# Patient Record
Sex: Female | Born: 1945 | Race: White | Hispanic: No | Marital: Single | Smoking: Never smoker
Health system: Southern US, Community
[De-identification: ages and names within clinical notes are randomized; demographics above are authoritative.]

## PROBLEM LIST (undated history)

## (undated) DIAGNOSIS — E0590 Thyrotoxicosis, unspecified without thyrotoxic crisis or storm: Secondary | ICD-10-CM

## (undated) DIAGNOSIS — I10 Essential (primary) hypertension: Secondary | ICD-10-CM

## (undated) DIAGNOSIS — D6481 Anemia due to antineoplastic chemotherapy: Secondary | ICD-10-CM

## (undated) DIAGNOSIS — Z7901 Long term (current) use of anticoagulants: Secondary | ICD-10-CM

## (undated) DIAGNOSIS — I495 Sick sinus syndrome: Secondary | ICD-10-CM

## (undated) DIAGNOSIS — N319 Neuromuscular dysfunction of bladder, unspecified: Secondary | ICD-10-CM

## (undated) DIAGNOSIS — E119 Type 2 diabetes mellitus without complications: Secondary | ICD-10-CM

## (undated) DIAGNOSIS — K219 Gastro-esophageal reflux disease without esophagitis: Secondary | ICD-10-CM

## (undated) DIAGNOSIS — Z6841 Body Mass Index (BMI) 40.0 and over, adult: Secondary | ICD-10-CM

## (undated) DIAGNOSIS — J984 Other disorders of lung: Secondary | ICD-10-CM

## (undated) DIAGNOSIS — I517 Cardiomegaly: Secondary | ICD-10-CM

## (undated) DIAGNOSIS — Z91199 Patient's noncompliance with other medical treatment and regimen due to unspecified reason: Secondary | ICD-10-CM

## (undated) DIAGNOSIS — I700 Atherosclerosis of aorta: Secondary | ICD-10-CM

## (undated) DIAGNOSIS — R600 Localized edema: Secondary | ICD-10-CM

## (undated) DIAGNOSIS — I4891 Unspecified atrial fibrillation: Secondary | ICD-10-CM

## (undated) DIAGNOSIS — G4730 Sleep apnea, unspecified: Secondary | ICD-10-CM

## (undated) DIAGNOSIS — K7460 Unspecified cirrhosis of liver: Secondary | ICD-10-CM

## (undated) DIAGNOSIS — J189 Pneumonia, unspecified organism: Secondary | ICD-10-CM

## (undated) DIAGNOSIS — I272 Pulmonary hypertension, unspecified: Secondary | ICD-10-CM

## (undated) DIAGNOSIS — Z950 Presence of cardiac pacemaker: Secondary | ICD-10-CM

## (undated) DIAGNOSIS — T8859XA Other complications of anesthesia, initial encounter: Secondary | ICD-10-CM

## (undated) DIAGNOSIS — E559 Vitamin D deficiency, unspecified: Secondary | ICD-10-CM

## (undated) DIAGNOSIS — J1282 Pneumonia due to coronavirus disease 2019: Secondary | ICD-10-CM

## (undated) DIAGNOSIS — F411 Generalized anxiety disorder: Secondary | ICD-10-CM

## (undated) DIAGNOSIS — I77810 Thoracic aortic ectasia: Secondary | ICD-10-CM

## (undated) DIAGNOSIS — U071 COVID-19: Secondary | ICD-10-CM

## (undated) DIAGNOSIS — I5030 Unspecified diastolic (congestive) heart failure: Secondary | ICD-10-CM

## (undated) DIAGNOSIS — I509 Heart failure, unspecified: Secondary | ICD-10-CM

## (undated) DIAGNOSIS — I2510 Atherosclerotic heart disease of native coronary artery without angina pectoris: Secondary | ICD-10-CM

## (undated) HISTORY — PX: TOTAL ABDOMINAL HYSTERECTOMY: SHX209

## (undated) HISTORY — DX: Body Mass Index (BMI) 40.0 and over, adult: Z684

## (undated) HISTORY — PX: OTHER SURGICAL HISTORY: SHX169

## (undated) HISTORY — PX: CARDIAC CATHETERIZATION: SHX172

## (undated) HISTORY — PX: ABSCESS DRAINAGE: SHX1119

## (undated) HISTORY — PX: TUBAL LIGATION: SHX77

## (undated) HISTORY — PX: CHOLECYSTECTOMY: SHX55

## (undated) HISTORY — DX: Pneumonia due to coronavirus disease 2019: J12.82

---

## 2007-04-15 DIAGNOSIS — I2510 Atherosclerotic heart disease of native coronary artery without angina pectoris: Secondary | ICD-10-CM

## 2007-04-15 HISTORY — DX: Atherosclerotic heart disease of native coronary artery without angina pectoris: I25.10

## 2008-06-26 DIAGNOSIS — Z950 Presence of cardiac pacemaker: Secondary | ICD-10-CM

## 2008-06-26 DIAGNOSIS — I495 Sick sinus syndrome: Secondary | ICD-10-CM

## 2008-06-26 HISTORY — PX: CARDIAC PACEMAKER PLACEMENT: SHX583

## 2008-06-26 HISTORY — DX: Presence of cardiac pacemaker: Z95.0

## 2008-06-26 HISTORY — DX: Sick sinus syndrome: I49.5

## 2011-08-03 ENCOUNTER — Emergency Department: Payer: Self-pay | Admitting: Emergency Medicine

## 2011-08-03 LAB — COMPREHENSIVE METABOLIC PANEL
Albumin: 3.1 g/dL — ABNORMAL LOW (ref 3.4–5.0)
Anion Gap: 10 (ref 7–16)
BUN: 15 mg/dL (ref 7–18)
Calcium, Total: 8.5 mg/dL (ref 8.5–10.1)
Creatinine: 1.1 mg/dL (ref 0.60–1.30)
EGFR (African American): >60
Osmolality: 288 (ref 275–301)
Sodium: 144 mmol/L (ref 136–145)

## 2011-08-03 LAB — CBC
HCT: 41.4 % (ref 35.0–47.0)
HGB: 13.2 g/dL (ref 12.0–16.0)
MCV: 87 fL (ref 80–100)
Platelet: 185 10*3/uL (ref 150–440)
RBC: 4.74 10*6/uL (ref 3.80–5.20)
RDW: 14.4 % (ref 11.5–14.5)
WBC: 6.5 10*3/uL (ref 3.6–11.0)

## 2011-08-03 LAB — CK TOTAL AND CKMB (NOT AT ARMC)
CK, Total: 43 U/L (ref 21–215)
CK-MB: <0.5 ng/mL — ABNORMAL LOW (ref 0.5–3.6)

## 2011-08-03 LAB — PRO B NATRIURETIC PEPTIDE: B-Type Natriuretic Peptide: 237 pg/mL — ABNORMAL HIGH (ref 0–125)

## 2011-08-03 LAB — PROTIME-INR: Prothrombin Time: 18.9 secs — ABNORMAL HIGH (ref 11.5–14.7)

## 2011-12-26 ENCOUNTER — Emergency Department: Payer: Self-pay | Admitting: Emergency Medicine

## 2011-12-26 LAB — URINALYSIS, COMPLETE
Blood: NEGATIVE
Hyaline Cast: 1
Nitrite: NEGATIVE
Protein: 30
RBC,UR: <1 /HPF (ref 0–5)
WBC UR: 1 /HPF (ref 0–5)

## 2011-12-28 ENCOUNTER — Emergency Department: Payer: Self-pay | Admitting: Emergency Medicine

## 2013-08-19 HISTORY — PX: TEE WITH CARDIOVERSION: SHX5442

## 2013-08-19 HISTORY — PX: CARDIOVERSION: SHX1299

## 2013-09-08 HISTORY — PX: CARDIOVERSION: SHX1299

## 2016-03-28 HISTORY — PX: PACEMAKER GENERATOR CHANGE: SHX5998

## 2019-08-08 ENCOUNTER — Encounter: Payer: Self-pay | Admitting: Emergency Medicine

## 2019-08-08 ENCOUNTER — Inpatient Hospital Stay
Admission: EM | Admit: 2019-08-08 | Discharge: 2019-08-12 | DRG: 177 | Disposition: A | Discharge status: Home / Self Care | Payer: Medicare Other | Attending: Internal Medicine | Admitting: Internal Medicine

## 2019-08-08 ENCOUNTER — Emergency Department: Payer: Medicare Other

## 2019-08-08 ENCOUNTER — Other Ambulatory Visit: Payer: Self-pay

## 2019-08-08 DIAGNOSIS — Z8616 Personal history of COVID-19: Secondary | ICD-10-CM

## 2019-08-08 DIAGNOSIS — Z6841 Body Mass Index (BMI) 40.0 and over, adult: Secondary | ICD-10-CM | POA: Diagnosis not present

## 2019-08-08 DIAGNOSIS — I480 Paroxysmal atrial fibrillation: Secondary | ICD-10-CM | POA: Diagnosis not present

## 2019-08-08 DIAGNOSIS — U071 COVID-19: Secondary | ICD-10-CM | POA: Diagnosis present

## 2019-08-08 DIAGNOSIS — I5031 Acute diastolic (congestive) heart failure: Secondary | ICD-10-CM | POA: Diagnosis not present

## 2019-08-08 DIAGNOSIS — Y92009 Unspecified place in unspecified non-institutional (private) residence as the place of occurrence of the external cause: Secondary | ICD-10-CM | POA: Diagnosis not present

## 2019-08-08 DIAGNOSIS — Z7984 Long term (current) use of oral hypoglycemic drugs: Secondary | ICD-10-CM

## 2019-08-08 DIAGNOSIS — I110 Hypertensive heart disease with heart failure: Secondary | ICD-10-CM | POA: Diagnosis present

## 2019-08-08 DIAGNOSIS — I5033 Acute on chronic diastolic (congestive) heart failure: Secondary | ICD-10-CM | POA: Diagnosis present

## 2019-08-08 DIAGNOSIS — L03115 Cellulitis of right lower limb: Secondary | ICD-10-CM | POA: Diagnosis present

## 2019-08-08 DIAGNOSIS — I4891 Unspecified atrial fibrillation: Secondary | ICD-10-CM | POA: Diagnosis present

## 2019-08-08 DIAGNOSIS — L0390 Cellulitis, unspecified: Secondary | ICD-10-CM

## 2019-08-08 DIAGNOSIS — J9601 Acute respiratory failure with hypoxia: Secondary | ICD-10-CM | POA: Diagnosis present

## 2019-08-08 DIAGNOSIS — T501X6A Underdosing of loop [high-ceiling] diuretics, initial encounter: Secondary | ICD-10-CM | POA: Diagnosis present

## 2019-08-08 DIAGNOSIS — T380X5A Adverse effect of glucocorticoids and synthetic analogues, initial encounter: Secondary | ICD-10-CM | POA: Diagnosis present

## 2019-08-08 DIAGNOSIS — E119 Type 2 diabetes mellitus without complications: Secondary | ICD-10-CM

## 2019-08-08 DIAGNOSIS — Z79899 Other long term (current) drug therapy: Secondary | ICD-10-CM | POA: Diagnosis not present

## 2019-08-08 DIAGNOSIS — E1165 Type 2 diabetes mellitus with hyperglycemia: Secondary | ICD-10-CM | POA: Diagnosis present

## 2019-08-08 DIAGNOSIS — L8990 Pressure ulcer of unspecified site, unspecified stage: Secondary | ICD-10-CM | POA: Insufficient documentation

## 2019-08-08 DIAGNOSIS — Z91128 Patient's intentional underdosing of medication regimen for other reason: Secondary | ICD-10-CM | POA: Diagnosis not present

## 2019-08-08 DIAGNOSIS — Z7901 Long term (current) use of anticoagulants: Secondary | ICD-10-CM | POA: Diagnosis not present

## 2019-08-08 DIAGNOSIS — R0902 Hypoxemia: Secondary | ICD-10-CM

## 2019-08-08 DIAGNOSIS — J1282 Pneumonia due to coronavirus disease 2019: Secondary | ICD-10-CM | POA: Diagnosis not present

## 2019-08-08 HISTORY — DX: Essential (primary) hypertension: I10

## 2019-08-08 HISTORY — DX: Personal history of COVID-19: Z86.16

## 2019-08-08 HISTORY — DX: Heart failure, unspecified: I50.9

## 2019-08-08 HISTORY — DX: Type 2 diabetes mellitus without complications: E11.9

## 2019-08-08 HISTORY — DX: Unspecified atrial fibrillation: I48.91

## 2019-08-08 LAB — LACTIC ACID, PLASMA: Lactic Acid, Venous: 1.8 mmol/L (ref 0.5–1.9)

## 2019-08-08 LAB — COMPREHENSIVE METABOLIC PANEL
ALT: 17 U/L (ref 0–44)
AST: 36 U/L (ref 15–41)
Albumin: 2.3 g/dL — ABNORMAL LOW (ref 3.5–5.0)
Alkaline Phosphatase: 75 U/L (ref 38–126)
Anion gap: 11 (ref 5–15)
BUN: 14 mg/dL (ref 8–23)
CO2: 25 mmol/L (ref 22–32)
Calcium: 8.2 mg/dL — ABNORMAL LOW (ref 8.9–10.3)
Chloride: 100 mmol/L (ref 98–111)
Creatinine, Ser: 0.89 mg/dL (ref 0.44–1.00)
GFR calc Af Amer: >60 mL/min (ref >60)
GFR calc non Af Amer: >60 mL/min (ref >60)
Glucose, Bld: 192 mg/dL — ABNORMAL HIGH (ref 70–99)
Potassium: 4.8 mmol/L (ref 3.5–5.1)
Sodium: 136 mmol/L (ref 135–145)
Total Bilirubin: 1.8 mg/dL — ABNORMAL HIGH (ref 0.3–1.2)
Total Protein: 7.3 g/dL (ref 6.5–8.1)

## 2019-08-08 LAB — CBC WITH DIFFERENTIAL/PLATELET
Abs Immature Granulocytes: 0.4 10*3/uL — ABNORMAL HIGH (ref 0.00–0.07)
Basophils Absolute: 0.1 10*3/uL (ref 0.0–0.1)
Basophils Relative: 0 %
Eosinophils Absolute: 0 10*3/uL (ref 0.0–0.5)
Eosinophils Relative: 0 %
HCT: 42 % (ref 36.0–46.0)
Hemoglobin: 12.9 g/dL (ref 12.0–15.0)
Immature Granulocytes: 3 %
Lymphocytes Relative: 7 %
Lymphs Abs: 1 10*3/uL (ref 0.7–4.0)
MCH: 26.7 pg (ref 26.0–34.0)
MCHC: 30.7 g/dL (ref 30.0–36.0)
MCV: 86.8 fL (ref 80.0–100.0)
Monocytes Absolute: 1.2 10*3/uL — ABNORMAL HIGH (ref 0.1–1.0)
Monocytes Relative: 9 %
Neutro Abs: 11.5 10*3/uL — ABNORMAL HIGH (ref 1.7–7.7)
Neutrophils Relative %: 81 %
Platelets: 297 10*3/uL (ref 150–400)
RBC: 4.84 MIL/uL (ref 3.87–5.11)
RDW: 14.5 % (ref 11.5–15.5)
WBC: 14.2 10*3/uL — ABNORMAL HIGH (ref 4.0–10.5)
nRBC: 0 % (ref 0.0–0.2)

## 2019-08-08 LAB — BRAIN NATRIURETIC PEPTIDE: B Natriuretic Peptide: 168 pg/mL — ABNORMAL HIGH (ref 0.0–100.0)

## 2019-08-08 LAB — GLUCOSE, CAPILLARY: Glucose-Capillary: 178 mg/dL — ABNORMAL HIGH (ref 70–99)

## 2019-08-08 LAB — RESPIRATORY PANEL BY RT PCR (FLU A&B, COVID)
Influenza A by PCR: NEGATIVE
Influenza B by PCR: NEGATIVE
SARS Coronavirus 2 by RT PCR: POSITIVE — AB

## 2019-08-08 LAB — TROPONIN I (HIGH SENSITIVITY): Troponin I (High Sensitivity): 7 ng/L (ref <18)

## 2019-08-08 LAB — FIBRIN DERIVATIVES D-DIMER (ARMC ONLY): Fibrin derivatives D-dimer (ARMC): 3749.31 ng/mL (FEU) — ABNORMAL HIGH (ref 0.00–499.00)

## 2019-08-08 LAB — PROCALCITONIN: Procalcitonin: 0.55 ng/mL

## 2019-08-08 MED ORDER — SODIUM CHLORIDE 0.9 % IV SOLN
100.0000 mg | Freq: Every day | INTRAVENOUS | Status: AC
  Administered 2019-08-09 – 2019-08-12 (×4): 100 mg via INTRAVENOUS
  Filled 2019-08-08: qty 20
  Filled 2019-08-08: qty 100
  Filled 2019-08-08 (×3): qty 20

## 2019-08-08 MED ORDER — VANCOMYCIN HCL 10 G IV SOLR
2500.0000 mg | Freq: Once | INTRAVENOUS | Status: AC
  Administered 2019-08-08: 20:00:00 2500 mg via INTRAVENOUS
  Filled 2019-08-08: qty 2500

## 2019-08-08 MED ORDER — INSULIN ASPART 100 UNIT/ML ~~LOC~~ SOLN
0.0000 [IU] | Freq: Three times a day (TID) | SUBCUTANEOUS | Status: DC
  Administered 2019-08-09 – 2019-08-10 (×4): 5 [IU] via SUBCUTANEOUS
  Administered 2019-08-10: 12:00:00 3 [IU] via SUBCUTANEOUS
  Administered 2019-08-10 – 2019-08-11 (×2): 8 [IU] via SUBCUTANEOUS
  Administered 2019-08-11: 17:00:00 2 [IU] via SUBCUTANEOUS
  Administered 2019-08-11: 12:00:00 5 [IU] via SUBCUTANEOUS
  Administered 2019-08-12: 13:00:00 3 [IU] via SUBCUTANEOUS
  Administered 2019-08-12: 8 [IU] via SUBCUTANEOUS
  Filled 2019-08-08 (×8): qty 1

## 2019-08-08 MED ORDER — ONDANSETRON HCL 4 MG/2ML IJ SOLN: 4.0000 mg | Freq: Four times a day (QID) | INTRAMUSCULAR | Status: DC | PRN

## 2019-08-08 MED ORDER — ACETAMINOPHEN 325 MG PO TABS
650.0000 mg | ORAL_TABLET | Freq: Four times a day (QID) | ORAL | Status: DC | PRN
  Administered 2019-08-11: 21:00:00 650 mg via ORAL
  Filled 2019-08-08: qty 2

## 2019-08-08 MED ORDER — ASCORBIC ACID 500 MG PO TABS
500.0000 mg | ORAL_TABLET | Freq: Every day | ORAL | Status: DC
  Administered 2019-08-08 – 2019-08-12 (×5): 500 mg via ORAL
  Filled 2019-08-08 (×5): qty 1

## 2019-08-08 MED ORDER — VITAMIN D 25 MCG (1000 UNIT) PO TABS
1000.0000 [IU] | ORAL_TABLET | Freq: Every day | ORAL | Status: DC
  Administered 2019-08-08 – 2019-08-12 (×5): 1000 [IU] via ORAL
  Filled 2019-08-08 (×6): qty 1

## 2019-08-08 MED ORDER — METOPROLOL SUCCINATE ER 25 MG PO TB24: 12.5000 mg | ORAL_TABLET | Freq: Every day | ORAL | Status: DC

## 2019-08-08 MED ORDER — FUROSEMIDE 10 MG/ML IJ SOLN: 40.0000 mg | Freq: Every day | INTRAMUSCULAR | Status: DC

## 2019-08-08 MED ORDER — POTASSIUM CHLORIDE CRYS ER 10 MEQ PO TBCR
10.0000 meq | EXTENDED_RELEASE_TABLET | Freq: Three times a day (TID) | ORAL | Status: DC
  Administered 2019-08-08 – 2019-08-12 (×11): 10 meq via ORAL
  Filled 2019-08-08 (×10): qty 1

## 2019-08-08 MED ORDER — ZINC SULFATE 220 (50 ZN) MG PO CAPS
220.0000 mg | ORAL_CAPSULE | Freq: Every day | ORAL | Status: DC
  Administered 2019-08-08 – 2019-08-12 (×5): 220 mg via ORAL
  Filled 2019-08-08 (×5): qty 1

## 2019-08-08 MED ORDER — IPRATROPIUM-ALBUTEROL 20-100 MCG/ACT IN AERS
1.0000 | INHALATION_SPRAY | Freq: Four times a day (QID) | RESPIRATORY_TRACT | Status: DC
  Administered 2019-08-08 – 2019-08-12 (×15): 1 via RESPIRATORY_TRACT
  Filled 2019-08-08: qty 4

## 2019-08-08 MED ORDER — ACETAMINOPHEN 650 MG RE SUPP: 650.0000 mg | Freq: Four times a day (QID) | RECTAL | Status: DC | PRN

## 2019-08-08 MED ORDER — SODIUM CHLORIDE 0.9 % IV SOLN
2.0000 g | Freq: Once | INTRAVENOUS | Status: AC
  Administered 2019-08-08: 2 g via INTRAVENOUS
  Filled 2019-08-08: qty 20

## 2019-08-08 MED ORDER — SODIUM CHLORIDE 0.9 % IV BOLUS: 500.0000 mL | Freq: Once | INTRAVENOUS | Status: DC

## 2019-08-08 MED ORDER — METOPROLOL TARTRATE 5 MG/5ML IV SOLN
2.5000 mg | Freq: Once | INTRAVENOUS | Status: AC
  Administered 2019-08-08: 2.5 mg via INTRAVENOUS
  Filled 2019-08-08: qty 5

## 2019-08-08 MED ORDER — ONDANSETRON HCL 4 MG PO TABS: 4.0000 mg | ORAL_TABLET | Freq: Four times a day (QID) | ORAL | Status: DC | PRN

## 2019-08-08 MED ORDER — METOPROLOL TARTRATE 25 MG PO TABS
12.5000 mg | ORAL_TABLET | Freq: Two times a day (BID) | ORAL | Status: DC
  Administered 2019-08-08 – 2019-08-12 (×8): 12.5 mg via ORAL
  Filled 2019-08-08 (×8): qty 1

## 2019-08-08 MED ORDER — CEFAZOLIN SODIUM-DEXTROSE 2-4 GM/100ML-% IV SOLN
2.0000 g | Freq: Three times a day (TID) | INTRAVENOUS | Status: DC
  Administered 2019-08-08 – 2019-08-12 (×12): 2 g via INTRAVENOUS
  Filled 2019-08-08 (×13): qty 100

## 2019-08-08 MED ORDER — RIVAROXABAN 20 MG PO TABS
20.0000 mg | ORAL_TABLET | Freq: Every evening | ORAL | Status: DC
  Administered 2019-08-09 – 2019-08-11 (×4): 20 mg via ORAL
  Filled 2019-08-08 (×5): qty 1

## 2019-08-08 MED ORDER — FUROSEMIDE 10 MG/ML IJ SOLN
40.0000 mg | Freq: Once | INTRAMUSCULAR | Status: DC
  Filled 2019-08-08: qty 4

## 2019-08-08 MED ORDER — DEXAMETHASONE SODIUM PHOSPHATE 10 MG/ML IJ SOLN
6.0000 mg | INTRAMUSCULAR | Status: DC
  Administered 2019-08-08 – 2019-08-11 (×4): 6 mg via INTRAVENOUS
  Filled 2019-08-08 (×5): qty 1

## 2019-08-08 MED ORDER — INSULIN ASPART 100 UNIT/ML ~~LOC~~ SOLN
0.0000 [IU] | Freq: Every day | SUBCUTANEOUS | Status: DC
  Filled 2019-08-08 (×3): qty 1

## 2019-08-08 MED ORDER — SODIUM CHLORIDE 0.9 % IV SOLN
200.0000 mg | Freq: Once | INTRAVENOUS | Status: AC
  Administered 2019-08-09: 03:00:00 200 mg via INTRAVENOUS
  Filled 2019-08-08: qty 40

## 2019-08-08 NOTE — ED Triage Notes (Signed)
Pt to ED via [facility] from home for shortness of breath. Pt states that she was diagnosed with COVID 2 weeks ago and is still not feeling well. Pt is tachycardic on arrival. Per EMS pt SpO2 90% on room mail. Pt is currently on 2 liters. Pt is in NAD

## 2019-08-08 NOTE — Consult Note (Signed)
Pharmacy Antibiotic Note  Jennifer Gregory is a 74 y.o. female admitted on 08/08/2019 with cellulitis.  Pharmacy has been consulted for Cefazolin dosing. Patient received 1 dose of Rocephin 2g VI in ED.  Plan: Cefazolin 2g Q8 hours  Height: 5\' 4"  (162.6 cm) Weight: (!) 308 lb (139.7 kg) IBW/kg (Calculated) : 54.7  Temp (24hrs), Avg:98.4 F (36.9 C), Min:98.4 F (36.9 C), Max:98.4 F (36.9 C)  Recent Labs  Lab 08/08/19 1413 08/08/19 1538  WBC 14.2*  --   CREATININE  --  0.89  LATICACIDVEN  --  1.8    Estimated Creatinine Clearance: 77.7 mL/min (by C-G formula based on SCr of 0.89 mg/dL).    Allergies  Allergen Reactions  . Aspirin Other (See Comments)    Bleeding risk since patient is on xarelta  . Ciprofloxacin Other (See Comments)    Other Reaction: RASH ITCHING   . Codeine Other (See Comments)  . Diazepam Other (See Comments) and Nausea And Vomiting  . Dofetilide Other (See Comments)    Other Reaction: arrhythmia   . Hydrocodone-Acetaminophen Other (See Comments) and Nausea And Vomiting  . Morphine Nausea And Vomiting  . Niacin Other (See Comments)    fatigue  . Oxycodone-Acetaminophen Other (See Comments) and Nausea And Vomiting  . Sotalol Other (See Comments)    Other Reaction: Prolonged QTc   . Amiodarone Other (See Comments)    Affecting liver   . Pyridoxine Other (See Comments)    Splitting headache  . Sulfamethoxazole-Trimethoprim Swelling    Antimicrobials this admission: Cefazolin 3/19 >>  Rocephin 3/19 x1  Microbiology results: 3/19 BCx: pending  Thank you for allowing pharmacy to be a part of this patient's care.  4/19 08/08/2019 6:36 PM

## 2019-08-08 NOTE — ED Notes (Signed)
Report given to Jenna, RN

## 2019-08-08 NOTE — ED Provider Notes (Signed)
[facility] Emergency Department Provider Note  ____________________________________________   First MD Initiated Contact with Patient 08/08/19 1325     (approximate)  I have reviewed the triage vital signs and the nursing notes.   HISTORY  Chief Complaint Shortness of Breath    HPI Jennifer Gregory is a 74 y.o. female with history of hypertension, diabetes, CHF, here with generalized weakness.  The patient recently was diagnosed with Covid over the last month.  She has had progressively worsening general fatigue and intermittent shortness of breath since then.  She had been fairly stable, however, until over the last several days she has had acute worsening of her weakness, and now is having difficulty just getting around.  She states that she did bump her leg last week, and has noticed some increasing redness and pain in this area.  She has had subjective chills, decreased appetite, and extreme fatigue.  She barely could even walk work or have a shower today without assistance.  This is very new for her.  She does also admit she has not been taking her diabetic medications or her Lasix as often, though she reports this is because she is not eating and drinking due to poor appetite.  She had mild nausea but no vomiting.        Past Medical History:  Diagnosis Date  . CHF (congestive heart failure) ([place])   . Diabetes mellitus without complication ([place])   . Hypertension     There are no problems to display for this patient.   Past Surgical History:  Procedure Laterality Date  . pacemaker      Prior to Admission medications   Medication Sig Start Date End Date Taking? Authorizing Provider  furosemide (LASIX) 40 MG tablet Take 80 mg by mouth daily. (take at about 4PM)   Yes [provider]  glipiZIDE (GLUCOTROL XL) 5 MG 24 hr tablet Take 5 mg by mouth daily. 06/12/19  Yes [provider]  metoprolol succinate (TOPROL-XL) 25 MG 24 hr  tablet Take 12.5 mg by mouth daily.   Yes [provider]  potassium chloride (KLOR-CON) 10 MEQ tablet Take 20 mEq by mouth 3 (three) times daily. 07/14/19  Yes [provider]  rivaroxaban (XARELTO) 20 MG TABS tablet Take 20 mg by mouth every evening. 05/30/19  Yes [provider]    Allergies Aspirin, Ciprofloxacin, Codeine, Diazepam, Dofetilide, Hydrocodone-acetaminophen, Morphine, Niacin, Oxycodone-acetaminophen, Sotalol, Amiodarone, Pyridoxine, and Sulfamethoxazole-trimethoprim  No family history on file.  Social History Social History   Tobacco Use  . Smoking status: Never Smoker  . Smokeless tobacco: Never Used  Substance Use Topics  . Alcohol use: Not Currently  . Drug use: Not Currently    Review of Systems  Review of Systems  Constitutional: Positive for fatigue.  Respiratory: Positive for shortness of breath.   Skin: Positive for rash.     ____________________________________________  PHYSICAL EXAM:      VITAL SIGNS: ED Triage Vitals  Enc Vitals Group     BP 08/08/19 1316 137/76     Pulse Rate 08/08/19 1316 89     Resp 08/08/19 1316 (!) 30     Temp 08/08/19 1323 98.4 F (36.9 C)     Temp Source 08/08/19 1323 Oral     SpO2 08/08/19 1316 92 %     Weight 08/08/19 1317 (!) 308 lb (139.7 kg)     Height 08/08/19 1317 5\' 4"  (1.626 m)     Head Circumference --  Peak Flow --      Pain Score 08/08/19 1317 0     Pain Loc --      Pain Edu? --      Excl. in GC? --      Physical Exam   Media Information   Document Information  Photos    08/08/2019 14:03  Attached To:  Hospital Encounter on 08/08/19  Source Information  Shaune Pollack, MD  [facility]    ____________________________________________   LABS (all labs ordered are listed, but only abnormal results are displayed)  Labs Reviewed  CBC WITH DIFFERENTIAL/PLATELET - Abnormal; Notable for the following components:      Result Value   WBC 14.2 (*)     Neutro Abs 11.5 (*)    Monocytes Absolute 1.2 (*)    Abs Immature Granulocytes 0.40 (*)    All other components within normal limits  BRAIN NATRIURETIC PEPTIDE - Abnormal; Notable for the following components:   B Natriuretic Peptide 168.0 (*)    All other components within normal limits  FIBRIN DERIVATIVES D-DIMER ([facility] ONLY) - Abnormal; Notable for the following components:   Fibrin derivatives D-dimer ([facility]) 3,749.31 (*)    All other components within normal limits  COMPREHENSIVE METABOLIC PANEL - Abnormal; Notable for the following components:   Glucose, Bld 192 (*)    Calcium 8.2 (*)    Albumin 2.3 (*)    Total Bilirubin 1.8 (*)    All other components within normal limits  CULTURE, BLOOD (ROUTINE X 2)  CULTURE, BLOOD (ROUTINE X 2)  RESPIRATORY PANEL BY RT PCR (FLU A&B, COVID)  LACTIC ACID, PLASMA  PROCALCITONIN  TROPONIN I (HIGH SENSITIVITY)    ____________________________________________  EKG: AFib, VR 126. QRS 149, QTc 622. Atrial flutter with rapid rate. Non-specific ST changes. ________________________________________  RADIOLOGY All imaging, including plain films, CT scans, and ultrasounds, independently reviewed by me, and interpretations confirmed via formal radiology reads.  ED MD interpretation:   CXR: Cardiomegaly, ? Trace edema XR Knee: Pending DVT: Pending  Official radiology report(s): DG Chest 2 View  Result Date: 08/08/2019 CLINICAL DATA:  Shortness of breath. EXAM: CHEST - 2 VIEW COMPARISON:  Chest x-ray 08/03/2011. FINDINGS: Cardiac pacer with lead tips over the right atrium right ventricle. Cardiomegaly with pulmonary venous congestion. Bibasilar atelectasis. Small bilateral pleural effusions. No pneumothorax. Degenerative change thoracic spine. IMPRESSION: 1. Cardiac pacer noted with lead tips over the right atrium right ventricle. Cardiomegaly with pulmonary venous congestion. 2. Mild bibasilar atelectasis. Small bilateral pleural effusions.  Electronically Signed   By: Maisie Fus  Register   On: 08/08/2019 13:53   US Venous Img Lower Unilateral Right  Result Date: 08/08/2019 CLINICAL DATA:  Right lower extremity pain and edema for the past 2 weeks. Evaluate for DVT. EXAM: RIGHT LOWER EXTREMITY VENOUS DOPPLER ULTRASOUND TECHNIQUE: Gray-scale sonography with graded compression, as well as color Doppler and duplex ultrasound were performed to evaluate the lower extremity deep venous systems from the level of the common femoral vein and including the common femoral, femoral, profunda femoral, popliteal and calf veins including the posterior tibial, peroneal and gastrocnemius veins when visible. The superficial great saphenous vein was also interrogated. Spectral Doppler was utilized to evaluate flow at rest and with distal augmentation maneuvers in the common femoral, femoral and popliteal veins. COMPARISON:  None. FINDINGS: Examination degraded due to patient body habitus and poor sonographic window. Contralateral Common Femoral Vein: Respiratory phasicity is normal and symmetric with the symptomatic side. No evidence of thrombus. Normal compressibility.  Common Femoral Vein: No evidence of thrombus. Normal compressibility, respiratory phasicity and response to augmentation. Saphenofemoral Junction: No evidence of thrombus. Normal compressibility and flow on color Doppler imaging. Profunda Femoral Vein: No evidence of thrombus. Normal compressibility and flow on color Doppler imaging. Femoral Vein: No evidence of thrombus. Normal compressibility, respiratory phasicity and response to augmentation. Popliteal Vein: No evidence of thrombus. Normal compressibility, respiratory phasicity and response to augmentation. Calf Veins: Appear patent where visualized. Superficial Great Saphenous Vein: No evidence of thrombus. Normal compressibility. Venous Reflux:  None. Other Findings:  None. IMPRESSION: No evidence of DVT within the right lower extremity.  Electronically Signed   By: Simonne Come M.D.   On: 08/08/2019 16:46   DG Knee Right Port  Result Date: 08/08/2019 CLINICAL DATA:  Right knee pain for 4 weeks EXAM: PORTABLE RIGHT KNEE - 1-2 VIEW COMPARISON:  None. FINDINGS: No acute fracture or malalignment. Mild-moderate medial compartment osteoarthritis. No appreciable knee joint effusion. Bones appear mildly demineralized. No focal soft tissue abnormality. IMPRESSION: No acute osseous abnormality of the right knee. Electronically Signed   By: Duanne Guess D.O.   On: 08/08/2019 16:07    ____________________________________________  PROCEDURES   Procedure(s) performed (including Critical Care):  .Critical Care Performed by: Shaune Pollack, MD Authorized by: Shaune Pollack, MD   Critical care provider statement:    Critical care time (minutes):  35   Critical care time was exclusive of:  Separately billable procedures and treating other patients and teaching time   Critical care was necessary to treat or prevent imminent or life-threatening deterioration of the following conditions:  Cardiac failure, circulatory failure and sepsis   Critical care was time spent personally by me on the following activities:  Development of treatment plan with patient or surrogate, discussions with consultants, evaluation of patient's response to treatment, examination of patient, obtaining history from patient or surrogate, ordering and performing treatments and interventions, ordering and review of laboratory studies, ordering and review of radiographic studies, pulse oximetry, re-evaluation of patient's condition and review of old charts   I assumed direction of critical care for this patient from another provider in my specialty: no   .1-3 Lead EKG Interpretation Performed by: Shaune Pollack, MD Authorized by: Shaune Pollack, MD     Interpretation: abnormal     ECG rate:  110-130s   ECG rate assessment: tachycardic     Rhythm: atrial  fibrillation     Ectopy: PVCs     Conduction: normal   Comments:     Indication: Atrial fibrillation with RVR, sepsis    ____________________________________________  INITIAL IMPRESSION / MDM / ASSESSMENT AND PLAN / ED COURSE  As part of my medical decision making, I reviewed the following data within the electronic MEDICAL RECORD NUMBER Nursing notes reviewed and incorporated, Old chart reviewed, Notes from prior ED visits, and [place] Controlled Substance Database       *Johann Gascoigne was evaluated in Emergency Department on 08/08/2019 for the symptoms described in the history of present illness. She was evaluated in the context of the global COVID-19 pandemic, which necessitated consideration that the patient might be at risk for infection with the SARS-CoV-2 virus that causes COVID-19. Institutional protocols and algorithms that pertain to the evaluation of patients at risk for COVID-19 are in a state of rapid change based on information released by regulatory bodies including the CDC and federal and state organizations. These policies and algorithms were followed during the patient's care in the ED.  Some ED  evaluations and interventions may be delayed as a result of limited staffing during the pandemic.*  Clinical Course as of Aug 07 1744  Fri Aug 08, 2019  747 74 year old female here with acute on chronic generalized weakness, tachycardia, and difficulty ambulating in the setting of recent Covid diagnosis.  On exam, she has fairly significant cellulitis of the right lower extremity, which she now admits she hit approximately week ago.  I suspect she has acute on chronic weakness secondary to active cellulitis of the right lower extremity, possibly causing worsening of her atrial fibrillation with subsequent mild CHF and respiratory distress.  She has significant leukocytosis with left shift, with A. fib RVR on the monitor.  Will give a low-dose of Lopressor, start empiric antibiotics, and plan to  admit.   [CI]    Clinical Course User Index [CI] Shaune Pollack, MD    Medical Decision Making:  As above. 74 yo F with ongoing deconditioning/SOB in setting of COVID-19 diagnosis 1 month ago, now with acute onset weakness and inability to ambulate likely 2/2 cellulitis of RLE, which occurred after scratching her leg on shower. No purulence but significant area of redness noted, so Vanc/Rocephin start. Pt in AFib RVR but is HDS - will trial lopressor IV, plan to admit. Holding fluids pending lactate, CMP as pt has h/o CHF and does appear hypervolemic.  ____________________________________________  FINAL CLINICAL IMPRESSION(S) / ED DIAGNOSES  Final diagnoses:  Cellulitis, unspecified cellulitis site  Atrial fibrillation with RVR ([facility])     MEDICATIONS GIVEN DURING THIS VISIT:  Medications  cefTRIAXone (ROCEPHIN) 2 g in sodium chloride 0.9 % 100 mL IVPB (2 g Intravenous New Bag/Given 08/08/19 1744)  vancomycin (VANCOCIN) 2,500 mg in sodium chloride 0.9 % 500 mL IVPB (has no administration in time range)  metoprolol tartrate (LOPRESSOR) injection 2.5 mg (2.5 mg Intravenous Given 08/08/19 1744)     ED Discharge Orders    None       Note:  This document was prepared using Dragon voice recognition software and may include unintentional dictation errors.   Shaune Pollack, MD 08/08/19 [phone]

## 2019-08-08 NOTE — Consult Note (Signed)
Remdesivir - Pharmacy Brief Note   O:  ALT: 36 [id number]:[id number] bibasilar atelectasis. Small bilateral pleural effusions.  SpO2: 95% on 2L   A/P:  Remdesivir 200 mg IVPB once followed by 100 mg IVPB daily x 4 days.   Bettey Costa, PharmD Clinical Pharmacist 08/08/2019 7:23 PM

## 2019-08-08 NOTE — Progress Notes (Signed)
PHARMACY -  BRIEF ANTIBIOTIC NOTE   Pharmacy has received consult(s) for Vancomycin from an ED provider.  The patient's profile has been reviewed for ht/wt/allergies/indication/available labs.    One time order(s) placed for Vancomycin 2500mg  IV x 1.  Further antibiotics/pharmacy consults should be ordered by admitting physician if indicated.                       Thank you, 08/08/2019  3:02 PM

## 2019-08-08 NOTE — ED Notes (Signed)
Per Dr. Renae Gloss 2nd lactic acid draw not needed

## 2019-08-08 NOTE — H&P (Signed)
[facility]-  at [facility]   PATIENT NAME: Jennifer Gregory    MR#:  [id number]  DATE OF BIRTH:  [birth year]  DATE OF ADMISSION:  08/08/2019  PRIMARY CARE PHYSICIAN: Rayetta Humphrey, MD   REQUESTING/REFERRING PHYSICIAN: Dr Dionne Bucy  CHIEF COMPLAINT:   Chief Complaint  Patient presents with  . Shortness of Breath    HISTORY OF PRESENT ILLNESS:  Jennifer Gregory  is a 74 y.o. female with a known history of recent diagnosis of a Covid infection in [place] on 07/22/2019.  She has not been feeling well at home.  She has been feeling very fatigued very weak.  She has been having some cough and some shortness of breath.  She has been having poor appetite and some nausea.  Only had a couple episodes of diarrhea.  No abdominal pain she also bumped her leg and thought it was a bruise but she has noticed some redness on her right leg going on for about a week.  She has been taking her Xarelto regularly but had not been taking her Lasix or glipizide.  In the ER she was found to have an elevated D-dimer.  She had a sonogram of the lower extremity that was negative for DVT.  Hospitalist services were contacted for further evaluation.  PAST MEDICAL HISTORY:   Past Medical History:  Diagnosis Date  . Atrial fibrillation (HCC)   . CHF (congestive heart failure) (HCC)   . Diabetes mellitus without complication (HCC)   . Hypertension     PAST SURGICAL HISTORY:   Past Surgical History:  Procedure Laterality Date  . ABSCESS DRAINAGE    . CESAREAN SECTION    . CHOLECYSTECTOMY    . pacemaker      SOCIAL HISTORY:   Social History   Tobacco Use  . Smoking status: Never Smoker  . Smokeless tobacco: Never Used  Substance Use Topics  . Alcohol use: Not Currently    FAMILY HISTORY:   Family History  Problem Relation Age of Onset  . Mesothelioma Father     DRUG ALLERGIES:   Allergies  Allergen Reactions  . Aspirin Other (See Comments)    Bleeding  risk since patient is on xarelta  . Ciprofloxacin Other (See Comments)    Other Reaction: RASH ITCHING   . Codeine Other (See Comments)  . Diazepam Other (See Comments) and Nausea And Vomiting  . Dofetilide Other (See Comments)    Other Reaction: arrhythmia   . Hydrocodone-Acetaminophen Other (See Comments) and Nausea And Vomiting  . Morphine Nausea And Vomiting  . Niacin Other (See Comments)    fatigue  . Oxycodone-Acetaminophen Other (See Comments) and Nausea And Vomiting  . Sotalol Other (See Comments)    Other Reaction: Prolonged QTc   . Amiodarone Other (See Comments)    Affecting liver   . Pyridoxine Other (See Comments)    Splitting headache  . Sulfamethoxazole-Trimethoprim Swelling    REVIEW OF SYSTEMS:  CONSTITUTIONAL: No fever, chills or sweats.  Positive for fatigue .  EARS, NOSE, AND THROAT: No runny nose.  No sore throat RESPIRATORY: Occasional cough, always has shortness of breath.  CARDIOVASCULAR: No chest pain.  GASTROINTESTINAL: Positive for nausea.no vomiting,  or abdominal pain. No blood in bowel movements.  Few episodes of diarrhea but that subsided. GENITOURINARY: No dysuria, hematuria.  ENDOCRINE: No polyuria HEMATOLOGY: No anemia SKIN: Right leg with redness for 1 week MUSCULOSKELETAL: Positive joint PSYCHIATRY: No anxiety or depression.   MEDICATIONS AT HOME:  Prior to Admission medications   Medication Sig Start Date End Date Taking? Authorizing Provider  furosemide (LASIX) 40 MG tablet Take 80 mg by mouth daily. (take at about 4PM)   Yes [provider]  glipiZIDE (GLUCOTROL XL) 5 MG 24 hr tablet Take 5 mg by mouth daily. 06/12/19  Yes [provider]  metoprolol succinate (TOPROL-XL) 25 MG 24 hr tablet Take 12.5 mg by mouth daily.   Yes [provider]  potassium chloride (KLOR-CON) 10 MEQ tablet Take 20 mEq by mouth 3 (three) times daily. 07/14/19  Yes [provider]  rivaroxaban (XARELTO) 20 MG TABS tablet  Take 20 mg by mouth every evening. 05/30/19  Yes [provider]      VITAL SIGNS:  Blood pressure (!) 136/91, pulse (!) 109, temperature 98.4 F (36.9 C), temperature source Oral, resp. rate (!) 30, height 5\' 4"  (1.626 m), weight (!) 139.7 kg, SpO2 96 %.  PHYSICAL EXAMINATION:  GENERAL:  74 y.o.-year-old patient lying in the bed with no acute distress.  EYES: No scleral icterus. Extraocular muscles intact.  HEENT: Head atraumatic, normocephalic. Oropharynx and nasopharynx clear.  NECK: No bruits LUNGS: Decreased breath sounds bilaterally, positive expiratory wheezing.  No rales,rhonchi or crepitation. No use of accessory muscles of respiration.  CARDIOVASCULAR: S1, S2 irregular irregular. No murmurs, rubs, or gallops.  ABDOMEN: Soft, nontender, nondistended.   EXTREMITIES: 2+ pedal edema.  NEUROLOGIC: Cranial nerves II through XII are intact. Sensation intact. Gait not checked.  PSYCHIATRIC: The patient is alert and oriented x 3.  SKIN: Erythema lateral right knee, thigh and upper leg.  Bilateral chronic lower extremity discoloration  LABORATORY PANEL:   CBC Recent Labs  Lab 08/08/19 1413  WBC 14.2*  HGB 12.9  HCT 42.0  PLT 297   ------------------------------------------------------------------------------------------------------------------  Chemistries  Recent Labs  Lab 08/08/19 1538  NA 136  K 4.8  CL 100  CO2 25  GLUCOSE 192*  BUN 14  CREATININE 0.89  CALCIUM 8.2*  AST 36  ALT 17  ALKPHOS 75  BILITOT 1.8*   ------------------------------------------------------------------------------------------------------------------    RADIOLOGY:  DG Chest 2 View  Result Date: 08/08/2019 CLINICAL DATA:  Shortness of breath. EXAM: CHEST - 2 VIEW COMPARISON:  Chest x-ray 08/03/2011. FINDINGS: Cardiac pacer with lead tips over the right atrium right ventricle. Cardiomegaly with pulmonary venous congestion. Bibasilar atelectasis. Small bilateral pleural  effusions. No pneumothorax. Degenerative change thoracic spine. IMPRESSION: 1. Cardiac pacer noted with lead tips over the right atrium right ventricle. Cardiomegaly with pulmonary venous congestion. 2. Mild bibasilar atelectasis. Small bilateral pleural effusions. Electronically Signed   By: 08/05/2011  Register   On: 08/08/2019 13:53   08/10/2019 Venous Img Lower Unilateral Right  Result Date: 08/08/2019 CLINICAL DATA:  Right lower extremity pain and edema for the past 2 weeks. Evaluate for DVT. EXAM: RIGHT LOWER EXTREMITY VENOUS DOPPLER ULTRASOUND TECHNIQUE: Gray-scale sonography with graded compression, as well as color Doppler and duplex ultrasound were performed to evaluate the lower extremity deep venous systems from the level of the common femoral vein and including the common femoral, femoral, profunda femoral, popliteal and calf veins including the posterior tibial, peroneal and gastrocnemius veins when visible. The superficial great saphenous vein was also interrogated. Spectral Doppler was utilized to evaluate flow at rest and with distal augmentation maneuvers in the common femoral, femoral and popliteal veins. COMPARISON:  None. FINDINGS: Examination degraded due to patient body habitus and poor sonographic window. Contralateral Common Femoral Vein: Respiratory phasicity is normal and symmetric  with the symptomatic side. No evidence of thrombus. Normal compressibility. Common Femoral Vein: No evidence of thrombus. Normal compressibility, respiratory phasicity and response to augmentation. Saphenofemoral Junction: No evidence of thrombus. Normal compressibility and flow on color Doppler imaging. Profunda Femoral Vein: No evidence of thrombus. Normal compressibility and flow on color Doppler imaging. Femoral Vein: No evidence of thrombus. Normal compressibility, respiratory phasicity and response to augmentation. Popliteal Vein: No evidence of thrombus. Normal compressibility, respiratory phasicity and response  to augmentation. Calf Veins: Appear patent where visualized. Superficial Great Saphenous Vein: No evidence of thrombus. Normal compressibility. Venous Reflux:  None. Other Findings:  None. IMPRESSION: No evidence of DVT within the right lower extremity. Electronically Signed   By: Sandi Mariscal M.D.   On: 08/08/2019 16:46   DG Knee Right Port  Result Date: 08/08/2019 CLINICAL DATA:  Right knee pain for 4 weeks EXAM: PORTABLE RIGHT KNEE - 1-2 VIEW COMPARISON:  None. FINDINGS: No acute fracture or malalignment. Mild-moderate medial compartment osteoarthritis. No appreciable knee joint effusion. Bones appear mildly demineralized. No focal soft tissue abnormality. IMPRESSION: No acute osseous abnormality of the right knee. Electronically Signed   By: Davina Poke D.O.   On: 08/08/2019 16:07    EKG:   Interpreted by me atrial fibrillation rapid ventricular response 126 bpm  IMPRESSION AND PLAN:   1.  Acute hypoxic respiratory failure secondary to COVID-19 infection.  Repeat Covid test since because we do not have record of it here.  Spoke with pharmacist to start remdesivir if positive test again.  Start dexamethasone.  Send off inflammatory markers.  Give vitamin C and vitamin D and zinc. Oxygen supplementation.  Combivent inhaler 2.  Atrial fibrillation with rapid ventricular response.  Likely paroxysmal nature.  Start low-dose metoprolol.  Patient already on Xarelto and will continue.  Patient states that she has been taking this and has not missed any doses. 3.  Acute on chronic diastolic congestive heart failure.  Will give Lasix IV daily with potassium replacement.  Continue metoprolol.  Recheck echocardiogram. 4.  Cellulitis right lower extremity with leukocytosis.  ER physician ordered initial antibiotics but I will put on Ancef. 5.  Type 2 diabetes mellitus.  We will put on sliding scale and monitor closely while on steroids. 6.  Morbid obesity with a BMI of 52.87.    All the records,  radiological data, laboratory data and EKG are reviewed and case discussed with ED provider. Management plans discussed with the patient, family and they are in agreement.  CODE STATUS: Full code  TOTAL TIME TAKING CARE OF THIS PATIENT: 55 minutes.    Loletha Grayer M.D on 08/08/2019 at 6:08 PM  Between 7am to 6pm - Pager - [phone]  After 6pm call admission pager [phone]  [facility]  CC: Primary care physician; Sharyne Peach, MD

## 2019-08-09 DIAGNOSIS — L8990 Pressure ulcer of unspecified site, unspecified stage: Secondary | ICD-10-CM | POA: Insufficient documentation

## 2019-08-09 DIAGNOSIS — I5033 Acute on chronic diastolic (congestive) heart failure: Secondary | ICD-10-CM

## 2019-08-09 DIAGNOSIS — I480 Paroxysmal atrial fibrillation: Secondary | ICD-10-CM

## 2019-08-09 LAB — CBC WITH DIFFERENTIAL/PLATELET
Abs Immature Granulocytes: 0.27 10*3/uL — ABNORMAL HIGH (ref 0.00–0.07)
Basophils Absolute: 0 10*3/uL (ref 0.0–0.1)
Basophils Relative: 0 %
Eosinophils Absolute: 0 10*3/uL (ref 0.0–0.5)
Eosinophils Relative: 0 %
HCT: 40.7 % (ref 36.0–46.0)
Hemoglobin: 12.4 g/dL (ref 12.0–15.0)
Immature Granulocytes: 2 %
Lymphocytes Relative: 7 %
Lymphs Abs: 0.8 10*3/uL (ref 0.7–4.0)
MCH: 26.7 pg (ref 26.0–34.0)
MCHC: 30.5 g/dL (ref 30.0–36.0)
MCV: 87.5 fL (ref 80.0–100.0)
Monocytes Absolute: 0.5 10*3/uL (ref 0.1–1.0)
Monocytes Relative: 4 %
Neutro Abs: 10.4 10*3/uL — ABNORMAL HIGH (ref 1.7–7.7)
Neutrophils Relative %: 87 %
Platelets: 302 10*3/uL (ref 150–400)
RBC: 4.65 MIL/uL (ref 3.87–5.11)
RDW: 14.6 % (ref 11.5–15.5)
WBC: 12 10*3/uL — ABNORMAL HIGH (ref 4.0–10.5)
nRBC: 0 % (ref 0.0–0.2)

## 2019-08-09 LAB — COMPREHENSIVE METABOLIC PANEL
ALT: 16 U/L (ref 0–44)
AST: 30 U/L (ref 15–41)
Albumin: 1.9 g/dL — ABNORMAL LOW (ref 3.5–5.0)
Alkaline Phosphatase: 78 U/L (ref 38–126)
Anion gap: 10 (ref 5–15)
BUN: 16 mg/dL (ref 8–23)
CO2: 28 mmol/L (ref 22–32)
Calcium: 8.3 mg/dL — ABNORMAL LOW (ref 8.9–10.3)
Chloride: 100 mmol/L (ref 98–111)
Creatinine, Ser: 0.89 mg/dL (ref 0.44–1.00)
GFR calc Af Amer: >60 mL/min (ref >60)
GFR calc non Af Amer: >60 mL/min (ref >60)
Glucose, Bld: 258 mg/dL — ABNORMAL HIGH (ref 70–99)
Potassium: 4.7 mmol/L (ref 3.5–5.1)
Sodium: 138 mmol/L (ref 135–145)
Total Bilirubin: 0.9 mg/dL (ref 0.3–1.2)
Total Protein: 6.7 g/dL (ref 6.5–8.1)

## 2019-08-09 LAB — GLUCOSE, CAPILLARY
Glucose-Capillary: 223 mg/dL — ABNORMAL HIGH (ref 70–99)
Glucose-Capillary: 229 mg/dL — ABNORMAL HIGH (ref 70–99)
Glucose-Capillary: 203 mg/dL — ABNORMAL HIGH (ref 70–99)
Glucose-Capillary: 190 mg/dL — ABNORMAL HIGH (ref 70–99)

## 2019-08-09 LAB — HEMOGLOBIN A1C
Hgb A1c MFr Bld: 6.7 % — ABNORMAL HIGH (ref 4.8–5.6)
Mean Plasma Glucose: 145.59 mg/dL

## 2019-08-09 LAB — PHOSPHORUS: Phosphorus: 4 mg/dL (ref 2.5–4.6)

## 2019-08-09 LAB — FERRITIN: Ferritin: 294 ng/mL (ref 11–307)

## 2019-08-09 LAB — ABO/RH: ABO/RH(D): A POS

## 2019-08-09 LAB — C-REACTIVE PROTEIN: CRP: 23.4 mg/dL — ABNORMAL HIGH (ref <1.0)

## 2019-08-09 LAB — FIBRIN DERIVATIVES D-DIMER (ARMC ONLY): Fibrin derivatives D-dimer (ARMC): 3193.09 ng/mL (FEU) — ABNORMAL HIGH (ref 0.00–499.00)

## 2019-08-09 LAB — MAGNESIUM: Magnesium: 2.1 mg/dL (ref 1.7–2.4)

## 2019-08-09 MED ORDER — FUROSEMIDE 10 MG/ML IJ SOLN
40.0000 mg | Freq: Two times a day (BID) | INTRAMUSCULAR | Status: AC
  Administered 2019-08-09 (×2): 40 mg via INTRAVENOUS
  Filled 2019-08-09 (×2): qty 4

## 2019-08-09 NOTE — Progress Notes (Signed)
PROGRESS NOTE    Jennifer Gregory  CNO:[id number] DOB: [birth year] DOA: 08/08/2019 PCP: Rayetta Humphrey, MD   Brief Narrative:  74 y.o. female with a known history of recent diagnosis of a Covid infection in [place] on 07/22/2019.  She has not been feeling well at home.  She has been feeling very fatigued very weak.  She has been having some cough and some shortness of breath.  She has been having poor appetite and some nausea.  Only had a couple episodes of diarrhea.  No abdominal pain she also bumped her leg and thought it was a bruise but she has noticed some redness on her right leg going on for about a week.  She has been taking her Xarelto regularly but had not been taking her Lasix or glipizide.  In the ER she was found to have an elevated D-dimer.  She had a sonogram of the lower extremity that was negative for DVT.  Hospitalist services were contacted for further evaluation.  3/20: Patient seen and examined.  Endorses improvement in shortness of breath.  Had refused IV Lasix yesterday.  States that she only becomes incontinent when Lasix is introduced.  Has not been taking Lasix for "at least a week".   Assessment & Plan:   Active Problems:   Acute hypoxemic respiratory failure due to COVID-19 (HCC)   Pressure injury of skin  Acute hypoxic respiratory failure secondary to COVID-19 infection Repeat Covid tests positive Patient started on remdesivir/steroids on admission Remains on 2 L nasal cannula Suspect combination of Covid infection and acute decompensated heart failure Plan: Continue remdesivir Continue Decadron Trend inflammatory markers Vitamin C, vitamin D, zinc supplementation Inhaler, with nebulizer Titrate oxygen as tolerated  Atrial fibrillation with rapid ventricular response, improved Improved rate control over interval Likely paroxysmal nature. Plan: Continue metoprolol, uptitrate as needed for rate control Continue Xarelto  Acute on chronic diastolic  congestive heart failure Likely secondary to medication nonadherence Patient states that she has not taken her Lasix in approximately week Chest x-ray notable for vascular congestion Plan: Follow-up repeat echocardiogram Lasix IV 40 mg twice daily for 1 day Foley for accurate ins and outs Will reassess need for diuresis in the morning Repeat chest x-ray in a.m.  Cellulitis right lower extremity with leukocytosis.   Mild, nonpurulent Continue Ancef  Type 2 diabetes mellitus Sliding scale coverage Monitor 24-hour insulin needed while on steroids Will consider addition of low-dose Lantus tomorrow  Morbid obesity BMI of 52.87.  Counseled patient   DVT prophylaxis: Xarelto Code Status: Full Family Communication:none today Disposition Plan: Anticipate return to home environment.  Current barriers to discharge.  Symptomatic COVID-19 infection.  Decompensated heart failure.  Acute respiratory failure requiring initiation of supplemental oxygen.  Will need titration from supplemental oxygen prior to considering discharge planning.  Consultants:   none  Procedures:   none  Antimicrobials:   Remdesivir  Cefazolin   Subjective: Patient seen and examined Symptoms improved over interval No respiratory distress  Objective: Vitals:   08/08/19 2003 08/08/19 2010 08/08/19 2055 08/09/19 0738  BP:   125/75 119/61  Pulse: 98 93 80 91  Resp: (!) 21 (!) 21 18 19   Temp:   97.8 F (36.6 C) 97.7 F (36.5 C)  TempSrc:   Oral Axillary  SpO2: 97% 98% 94% 91%  Weight:      Height:        Intake/Output Summary (Last 24 hours) at 08/09/2019 1457 Last data filed at 08/09/2019 0738 Gross per  24 hour  Intake 100 ml  Output --  Net 100 ml   Filed Weights   08/08/19 1317  Weight: (!) 139.7 kg    Examination:  General exam: Appears calm and comfortable  Respiratory system: Scattered crackles.  Lung sounds decreased at bases.  Normal work of breathing Cardiovascular system: S1  & S2 heard, RRR. No JVD, murmurs, rubs, gallops or clicks.  3+ nonpitting peripheral edema bilaterally Gastrointestinal system: Obese, abdomen is nondistended, soft and nontender. No organomegaly or masses felt. Normal bowel sounds heard. Central nervous system: Alert and oriented. No focal neurological deficits. Extremities: Symmetric 5 x 5 power. Skin: No rashes, lesions or ulcers Psychiatry: Judgement and insight appear normal. Mood & affect appropriate.     Data Reviewed: I have personally reviewed following labs and imaging studies  CBC: Recent Labs  Lab 08/08/19 1413 08/09/19 0731  WBC 14.2* 12.0*  NEUTROABS 11.5* 10.4*  HGB 12.9 12.4  HCT 42.0 40.7  MCV 86.8 87.5  PLT 297 302   Basic Metabolic Panel: Recent Labs  Lab 08/08/19 1538 08/09/19 0731  NA 136 138  K 4.8 4.7  CL 100 100  CO2 25 28  GLUCOSE 192* 258*  BUN 14 16  CREATININE 0.89 0.89  CALCIUM 8.2* 8.3*  MG  --  2.1  PHOS  --  4.0   GFR: Estimated Creatinine Clearance: 77.7 mL/min (by C-G formula based on SCr of 0.89 mg/dL). Liver Function Tests: Recent Labs  Lab 08/08/19 1538 08/09/19 0731  AST 36 30  ALT 17 16  ALKPHOS 75 78  BILITOT 1.8* 0.9  PROT 7.3 6.7  ALBUMIN 2.3* 1.9*   No results for input(s): LIPASE, AMYLASE in the last 168 hours. No results for input(s): AMMONIA in the last 168 hours. Coagulation Profile: No results for input(s): INR, PROTIME in the last 168 hours. Cardiac Enzymes: No results for input(s): CKTOTAL, CKMB, CKMBINDEX, TROPONINI in the last 168 hours. BNP (last 3 results) No results for input(s): PROBNP in the last 8760 hours. HbA1C: Recent Labs    08/09/19 0731  HGBA1C 6.7*   CBG: Recent Labs  Lab 08/08/19 2054 08/09/19 0841 08/09/19 1217  GLUCAP 178* 223* 229*   Lipid Profile: No results for input(s): CHOL, HDL, LDLCALC, TRIG, CHOLHDL, LDLDIRECT in the last 72 hours. Thyroid Function Tests: No results for input(s): TSH, T4TOTAL, FREET4, T3FREE,  THYROIDAB in the last 72 hours. Anemia Panel: Recent Labs    08/09/19 0731  FERRITIN 294   Sepsis Labs: Recent Labs  Lab 08/08/19 1538  PROCALCITON 0.55  LATICACIDVEN 1.8    Recent Results (from the past 240 hour(s))  Blood culture (routine x 2)     Status: None (Preliminary result)   Collection Time: 08/08/19  3:37 PM   Specimen: BLOOD  Result Value Ref Range Status   Specimen Description BLOOD BLOOD RIGHT HAND  Final   Special Requests   Final    BOTTLES DRAWN AEROBIC AND ANAEROBIC Blood Culture adequate volume   Culture   Final    NO GROWTH < 24 HOURS Performed at [facility], [address], [place], [place] [address]    Report Status PENDING  Incomplete  Blood culture (routine x 2)     Status: None (Preliminary result)   Collection Time: 08/08/19  5:22 PM   Specimen: BLOOD  Result Value Ref Range Status   Specimen Description BLOOD BLOOD RIGHT HAND  Final   Special Requests   Final    BOTTLES DRAWN  AEROBIC AND ANAEROBIC Blood Culture adequate volume   Culture   Final    NO GROWTH < 24 HOURS Performed at [facility], [address] [place], [place] [address]    Report Status PENDING  Incomplete  Respiratory Panel by RT PCR (Flu A&B, Covid) - Nasopharyngeal Swab     Status: Abnormal   Collection Time: 08/08/19  6:01 PM   Specimen: Nasopharyngeal Swab  Result Value Ref Range Status   SARS Coronavirus 2 by RT PCR POSITIVE (A) NEGATIVE Final    Comment: RESULT CALLED TO, READ BACK BY AND VERIFIED WITH: KATE BUCKNUM 08/08/19 @ 1916  MLK (NOTE) SARS-CoV-2 target nucleic acids are DETECTED. SARS-CoV-2 RNA is generally detectable in upper respiratory specimens  during the acute phase of infection. Positive results are indicative of the presence of the identified virus, but do not rule out bacterial infection or co-infection with other pathogens not detected by the test. Clinical correlation with patient history and other diagnostic information  is necessary to determine patient infection status. The expected result is Negative. Fact Sheet for Patients:  https://www.moore.com/ Fact Sheet for Healthcare Providers: https://www.young.biz/ This test is not yet approved or cleared by the [place] FDA and  has been authorized for detection and/or diagnosis of SARS-CoV-2 by FDA under an Emergency Use Authorization (EUA).  This EUA will remain in effect (meaning this test can be used) for  the duration of  the COVID-19 declaration under Section 564(b)(1) of the Act, 21 U.S.C. section 360bbb-3(b)(1), unless the authorization is terminated or revoked sooner.    Influenza A by PCR NEGATIVE NEGATIVE Final   Influenza B by PCR NEGATIVE NEGATIVE Final    Comment: (NOTE) The Xpert Xpress SARS-CoV-2/FLU/RSV assay is intended as an aid in  the diagnosis of influenza from Nasopharyngeal swab specimens and  should not be used as a sole basis for treatment. Nasal washings and  aspirates are unacceptable for Xpert Xpress SARS-CoV-2/FLU/RSV  testing. Fact Sheet for Patients: https://www.moore.com/ Fact Sheet for Healthcare Providers: https://www.young.biz/ This test is not yet approved or cleared by the [place] FDA and  has been authorized for detection and/or diagnosis of SARS-CoV-2 by  FDA under an Emergency Use Authorization (EUA). This EUA will remain  in effect (meaning this test can be used) for the duration of the  Covid-19 declaration under Section 564(b)(1) of the Act, 21  U.S.C. section 360bbb-3(b)(1), unless the authorization is  terminated or revoked. Performed at [facility], [address], [place], [place] [address]          Radiology Studies: DG Chest 2 View  Result Date: 08/08/2019 CLINICAL DATA:  Shortness of breath. EXAM: CHEST - 2 VIEW COMPARISON:  Chest x-ray 08/03/2011. FINDINGS: Cardiac pacer with lead tips over the  right atrium right ventricle. Cardiomegaly with pulmonary venous congestion. Bibasilar atelectasis. Small bilateral pleural effusions. No pneumothorax. Degenerative change thoracic spine. IMPRESSION: 1. Cardiac pacer noted with lead tips over the right atrium right ventricle. Cardiomegaly with pulmonary venous congestion. 2. Mild bibasilar atelectasis. Small bilateral pleural effusions. Electronically Signed   By: Maisie Fus  Register   On: 08/08/2019 13:53   US Venous Img Lower Unilateral Right  Result Date: 08/08/2019 CLINICAL DATA:  Right lower extremity pain and edema for the past 2 weeks. Evaluate for DVT. EXAM: RIGHT LOWER EXTREMITY VENOUS DOPPLER ULTRASOUND TECHNIQUE: Gray-scale sonography with graded compression, as well as color Doppler and duplex ultrasound were performed to evaluate the lower extremity deep venous systems from the level  of the common femoral vein and including the common femoral, femoral, profunda femoral, popliteal and calf veins including the posterior tibial, peroneal and gastrocnemius veins when visible. The superficial great saphenous vein was also interrogated. Spectral Doppler was utilized to evaluate flow at rest and with distal augmentation maneuvers in the common femoral, femoral and popliteal veins. COMPARISON:  None. FINDINGS: Examination degraded due to patient body habitus and poor sonographic window. Contralateral Common Femoral Vein: Respiratory phasicity is normal and symmetric with the symptomatic side. No evidence of thrombus. Normal compressibility. Common Femoral Vein: No evidence of thrombus. Normal compressibility, respiratory phasicity and response to augmentation. Saphenofemoral Junction: No evidence of thrombus. Normal compressibility and flow on color Doppler imaging. Profunda Femoral Vein: No evidence of thrombus. Normal compressibility and flow on color Doppler imaging. Femoral Vein: No evidence of thrombus. Normal compressibility, respiratory phasicity and  response to augmentation. Popliteal Vein: No evidence of thrombus. Normal compressibility, respiratory phasicity and response to augmentation. Calf Veins: Appear patent where visualized. Superficial Great Saphenous Vein: No evidence of thrombus. Normal compressibility. Venous Reflux:  None. Other Findings:  None. IMPRESSION: No evidence of DVT within the right lower extremity. Electronically Signed   By: Sandi Mariscal M.D.   On: 08/08/2019 16:46   DG Knee Right Port  Result Date: 08/08/2019 CLINICAL DATA:  Right knee pain for 4 weeks EXAM: PORTABLE RIGHT KNEE - 1-2 VIEW COMPARISON:  None. FINDINGS: No acute fracture or malalignment. Mild-moderate medial compartment osteoarthritis. No appreciable knee joint effusion. Bones appear mildly demineralized. No focal soft tissue abnormality. IMPRESSION: No acute osseous abnormality of the right knee. Electronically Signed   By: Davina Poke D.O.   On: 08/08/2019 16:07        Scheduled Meds: . vitamin C  500 mg Oral Daily  . cholecalciferol  1,000 Units Oral Daily  . dexamethasone (DECADRON) injection  6 mg Intravenous Q24H  . furosemide  40 mg Intravenous Q12H  . insulin aspart  0-15 Units Subcutaneous TID WC  . insulin aspart  0-5 Units Subcutaneous QHS  . Ipratropium-Albuterol  1 puff Inhalation Q6H  . metoprolol tartrate  12.5 mg Oral BID  . potassium chloride  10 mEq Oral TID  . rivaroxaban  20 mg Oral QPM  . zinc sulfate  220 mg Oral Daily   Continuous Infusions: .  ceFAZolin (ANCEF) IV 2 g (08/09/19 1333)  . remdesivir 100 mg in NS 100 mL 100 mg (08/09/19 1244)     LOS: 1 day    Time spent: 35 minutes    Sidney Ace, MD [facility] Pager [phone]  If 7PM-7AM, please contact night-coverage 08/09/2019, 2:57 PM

## 2019-08-09 NOTE — Progress Notes (Addendum)
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 Declined lasix previously. Pt declines Purewick and/or bedpan. MD made aware.

## 2019-08-10 ENCOUNTER — Inpatient Hospital Stay: Payer: Medicare Other

## 2019-08-10 LAB — CBC WITH DIFFERENTIAL/PLATELET
Abs Immature Granulocytes: 0.2 10*3/uL — ABNORMAL HIGH (ref 0.00–0.07)
Basophils Absolute: 0 10*3/uL (ref 0.0–0.1)
Basophils Relative: 0 %
Eosinophils Absolute: 0 10*3/uL (ref 0.0–0.5)
Eosinophils Relative: 0 %
HCT: 42.4 % (ref 36.0–46.0)
Hemoglobin: 13.1 g/dL (ref 12.0–15.0)
Immature Granulocytes: 2 %
Lymphocytes Relative: 7 %
Lymphs Abs: 0.8 10*3/uL (ref 0.7–4.0)
MCH: 26.6 pg (ref 26.0–34.0)
MCHC: 30.9 g/dL (ref 30.0–36.0)
MCV: 86 fL (ref 80.0–100.0)
Monocytes Absolute: 0.4 10*3/uL (ref 0.1–1.0)
Monocytes Relative: 4 %
Neutro Abs: 9.3 10*3/uL — ABNORMAL HIGH (ref 1.7–7.7)
Neutrophils Relative %: 87 %
Platelets: 312 10*3/uL (ref 150–400)
RBC: 4.93 MIL/uL (ref 3.87–5.11)
RDW: 14.6 % (ref 11.5–15.5)
WBC: 10.7 10*3/uL — ABNORMAL HIGH (ref 4.0–10.5)
nRBC: 0 % (ref 0.0–0.2)

## 2019-08-10 LAB — COMPREHENSIVE METABOLIC PANEL
ALT: 13 U/L (ref 0–44)
AST: 28 U/L (ref 15–41)
Albumin: 2.1 g/dL — ABNORMAL LOW (ref 3.5–5.0)
Alkaline Phosphatase: 67 U/L (ref 38–126)
Anion gap: 8 (ref 5–15)
BUN: 23 mg/dL (ref 8–23)
CO2: 32 mmol/L (ref 22–32)
Calcium: 8.3 mg/dL — ABNORMAL LOW (ref 8.9–10.3)
Chloride: 98 mmol/L (ref 98–111)
Creatinine, Ser: 0.84 mg/dL (ref 0.44–1.00)
GFR calc Af Amer: >60 mL/min (ref >60)
GFR calc non Af Amer: >60 mL/min (ref >60)
Glucose, Bld: 253 mg/dL — ABNORMAL HIGH (ref 70–99)
Potassium: 4.2 mmol/L (ref 3.5–5.1)
Sodium: 138 mmol/L (ref 135–145)
Total Bilirubin: 1 mg/dL (ref 0.3–1.2)
Total Protein: 7.1 g/dL (ref 6.5–8.1)

## 2019-08-10 LAB — C-REACTIVE PROTEIN: CRP: 16 mg/dL — ABNORMAL HIGH (ref <1.0)

## 2019-08-10 LAB — FERRITIN: Ferritin: 232 ng/mL (ref 11–307)

## 2019-08-10 LAB — GLUCOSE, CAPILLARY
Glucose-Capillary: 199 mg/dL — ABNORMAL HIGH (ref 70–99)
Glucose-Capillary: 203 mg/dL — ABNORMAL HIGH (ref 70–99)
Glucose-Capillary: 250 mg/dL — ABNORMAL HIGH (ref 70–99)
Glucose-Capillary: 254 mg/dL — ABNORMAL HIGH (ref 70–99)

## 2019-08-10 LAB — PHOSPHORUS: Phosphorus: 5.3 mg/dL — ABNORMAL HIGH (ref 2.5–4.6)

## 2019-08-10 LAB — MAGNESIUM: Magnesium: 1.9 mg/dL (ref 1.7–2.4)

## 2019-08-10 LAB — FIBRIN DERIVATIVES D-DIMER (ARMC ONLY): Fibrin derivatives D-dimer (ARMC): 2367.4 ng/mL (FEU) — ABNORMAL HIGH (ref 0.00–499.00)

## 2019-08-10 MED ORDER — DM-GUAIFENESIN ER 30-600 MG PO TB12
1.0000 | ORAL_TABLET | Freq: Two times a day (BID) | ORAL | Status: DC
  Administered 2019-08-10 – 2019-08-12 (×5): 1 via ORAL
  Filled 2019-08-10 (×4): qty 1

## 2019-08-10 MED ORDER — BENZONATATE 100 MG PO CAPS
200.0000 mg | ORAL_CAPSULE | Freq: Three times a day (TID) | ORAL | Status: DC
  Administered 2019-08-10 – 2019-08-12 (×6): 200 mg via ORAL
  Filled 2019-08-10 (×5): qty 2

## 2019-08-10 MED ORDER — FUROSEMIDE 10 MG/ML IJ SOLN
40.0000 mg | Freq: Two times a day (BID) | INTRAMUSCULAR | Status: AC
  Administered 2019-08-10: 11:00:00 40 mg via INTRAVENOUS
  Filled 2019-08-10: qty 4

## 2019-08-10 MED ORDER — CHLORHEXIDINE GLUCONATE CLOTH 2 % EX PADS
6.0000 | MEDICATED_PAD | Freq: Every day | CUTANEOUS | Status: DC
  Administered 2019-08-10 – 2019-08-11 (×2): 6 via TOPICAL

## 2019-08-10 NOTE — Progress Notes (Signed)
PROGRESS NOTE    Jennifer Gregory  ZMO:[id number] DOB: 06/11/1945 DOA: 08/08/2019 PCP: Rayetta Humphrey, MD   Brief Narrative:  74 y.o. female with a known history of recent diagnosis of a Covid infection in [place] on 07/22/2019.  She has not been feeling well at home.  She has been feeling very fatigued very weak.  She has been having some cough and some shortness of breath.  She has been having poor appetite and some nausea.  Only had a couple episodes of diarrhea.  No abdominal pain she also bumped her leg and thought it was a bruise but she has noticed some redness on her right leg going on for about a week.  She has been taking her Xarelto regularly but had not been taking her Lasix or glipizide.  In the ER she was found to have an elevated D-dimer.  She had a sonogram of the lower extremity that was negative for DVT.  Hospitalist services were contacted for further evaluation.  3/20: Patient seen and examined.  Endorses improvement in shortness of breath.  Had refused IV Lasix yesterday.  States that she only becomes incontinent when Lasix is introduced.  Has not been taking Lasix for "at least a week".  3/21: Patient seen and examined.  Had substantial urine output over interval.  Net -3.5 L since admission.  Weight has not been recorded since 08/08/2019.   Assessment & Plan:   Active Problems:   Acute hypoxemic respiratory failure due to COVID-19 (HCC)   Pressure injury of skin  Acute hypoxic respiratory failure secondary to COVID-19 infection Repeat Covid tests positive Patient started on remdesivir/steroids on admission Remains on 2 L nasal cannula Suspect combination of Covid infection and acute decompensated heart failure Plan: Continue remdesivir (day 3/5)  Continue Decadron (day 3/10) Trend inflammatory markers Vitamin C, vitamin D, zinc supplementation Inhaler, with nebulizer Titrate oxygen as tolerated  Acute on chronic diastolic congestive heart failure Likely  secondary to medication nonadherence Patient states that she has not taken her Lasix in approximately week Chest x-ray notable for vascular congestion Repeat chest x-ray on 08/10/2019 demonstrates continued pulmonary vascular congestion Plan: Follow-up repeat echocardiogram, pending Lasix 40 mg IV twice daily for today.  Reassess volume status and need for continued IV diuresis tomorrow morning Foley for accurate ins and outs Daily weights  Atrial fibrillation with rapid ventricular response, improved Improved rate control over interval Likely paroxysmal nature. Plan: Continue metoprolol, uptitrate as needed for rate control Continue Xarelto  Cellulitis right lower extremity with leukocytosis.   Mild, nonpurulent Continue Ancef, can be transitioned to p.o. if patient stable for discharge  Type 2 diabetes mellitus Sliding scale coverage Monitor 24-hour insulin needed while on steroids Will consider addition of low-dose Lantus tomorrow  Morbid obesity BMI of 52.87.  Counseled patient  DVT prophylaxis: Xarelto Code Status: Full Family Communication:none today Disposition Plan: Anticipate return to home environment.  Current barriers to discharge.  Symptomatic COVID-19 infection.  Decompensated heart failure.  Acute respiratory failure requiring initiation of supplemental oxygen.  Will need titration from supplemental oxygen prior to considering discharge planning.  Consultants:   none  Procedures:   none  Antimicrobials:   Remdesivir  Cefazolin   Subjective: Patient seen and examined Symptoms improved over interval No respiratory distress  Objective: Vitals:   08/09/19 2233 08/10/19 0100 08/10/19 0748 08/10/19 1000  BP: (!) 118/44 (!) 119/47 (!) 143/48 (!) 119/56  Pulse: (!) 106 96  93  Resp:  15 (!) 28  18  Temp:  97.6 F (36.4 C) 98.4 F (36.9 C)   TempSrc:  Oral  Oral  SpO2:  92%  95%  Weight:      Height:        Intake/Output Summary (Last 24  hours) at 08/10/2019 1251 Last data filed at 08/10/2019 0800 Gross per 24 hour  Intake 290.23 ml  Output 3850 ml  Net -3559.77 ml   Filed Weights   08/08/19 1317  Weight: (!) 139.7 kg    Examination:  General exam: Appears calm and comfortable  Respiratory system: Scattered crackles.  Lung sounds decreased at bases.  Normal work of breathing Cardiovascular system: S1 & S2 heard, RRR. No JVD, murmurs, rubs, gallops or clicks.  3+ nonpitting peripheral edema bilaterally Gastrointestinal system: Obese, abdomen is nondistended, soft and nontender. No organomegaly or masses felt. Normal bowel sounds heard. Central nervous system: Alert and oriented. No focal neurological deficits. Extremities: Symmetric 5 x 5 power. Skin: No rashes, lesions or ulcers Psychiatry: Judgement and insight appear normal. Mood & affect appropriate.     Data Reviewed: I have personally reviewed following labs and imaging studies  CBC: Recent Labs  Lab 08/08/19 1413 08/09/19 0731 08/10/19 0548  WBC 14.2* 12.0* 10.7*  NEUTROABS 11.5* 10.4* 9.3*  HGB 12.9 12.4 13.1  HCT 42.0 40.7 42.4  MCV 86.8 87.5 86.0  PLT 297 302 312   Basic Metabolic Panel: Recent Labs  Lab 08/08/19 1538 08/09/19 0731 08/10/19 0548  NA 136 138 138  K 4.8 4.7 4.2  CL 100 100 98  CO2 25 28 32  GLUCOSE 192* 258* 253*  BUN 14 16 23   CREATININE 0.89 0.89 0.84  CALCIUM 8.2* 8.3* 8.3*  MG  --  2.1 1.9  PHOS  --  4.0 5.3*   GFR: Estimated Creatinine Clearance: 82.3 mL/min (by C-G formula based on SCr of 0.84 mg/dL). Liver Function Tests: Recent Labs  Lab 08/08/19 1538 08/09/19 0731 08/10/19 0548  AST 36 30 28  ALT 17 16 13   ALKPHOS 75 78 67  BILITOT 1.8* 0.9 1.0  PROT 7.3 6.7 7.1  ALBUMIN 2.3* 1.9* 2.1*   No results for input(s): LIPASE, AMYLASE in the last 168 hours. No results for input(s): AMMONIA in the last 168 hours. Coagulation Profile: No results for input(s): INR, PROTIME in the last 168 hours. Cardiac  Enzymes: No results for input(s): CKTOTAL, CKMB, CKMBINDEX, TROPONINI in the last 168 hours. BNP (last 3 results) No results for input(s): PROBNP in the last 8760 hours. HbA1C: Recent Labs    08/09/19 0731  HGBA1C 6.7*   CBG: Recent Labs  Lab 08/09/19 1601 08/09/19 2214 08/10/19 0057 08/10/19 0742 08/10/19 1156  GLUCAP 203* 190* 203* 254* 199*   Lipid Profile: No results for input(s): CHOL, HDL, LDLCALC, TRIG, CHOLHDL, LDLDIRECT in the last 72 hours. Thyroid Function Tests: No results for input(s): TSH, T4TOTAL, FREET4, T3FREE, THYROIDAB in the last 72 hours. Anemia Panel: Recent Labs    08/09/19 0731 08/10/19 0548  FERRITIN 294 232   Sepsis Labs: Recent Labs  Lab 08/08/19 1538  PROCALCITON 0.55  LATICACIDVEN 1.8    Recent Results (from the past 240 hour(s))  Blood culture (routine x 2)     Status: None (Preliminary result)   Collection Time: 08/08/19  3:37 PM   Specimen: BLOOD  Result Value Ref Range Status   Specimen Description BLOOD BLOOD RIGHT HAND  Final   Special Requests   Final    BOTTLES DRAWN AEROBIC  AND ANAEROBIC Blood Culture adequate volume   Culture   Final    NO GROWTH 2 DAYS Performed at [facility], [address] [place], [place] [address]    Report Status PENDING  Incomplete  Blood culture (routine x 2)     Status: None (Preliminary result)   Collection Time: 08/08/19  5:22 PM   Specimen: BLOOD  Result Value Ref Range Status   Specimen Description BLOOD BLOOD RIGHT HAND  Final   Special Requests   Final    BOTTLES DRAWN AEROBIC AND ANAEROBIC Blood Culture adequate volume   Culture   Final    NO GROWTH 2 DAYS Performed at [facility], [address] [place], [place] [address]    Report Status PENDING  Incomplete  Respiratory Panel by RT PCR (Flu A&B, Covid) - Nasopharyngeal Swab     Status: Abnormal   Collection Time: 08/08/19  6:01 PM   Specimen: Nasopharyngeal Swab  Result Value Ref Range Status   SARS  Coronavirus 2 by RT PCR POSITIVE (A) NEGATIVE Final    Comment: RESULT CALLED TO, READ BACK BY AND VERIFIED WITH: KATE BUCKNUM 08/08/19 @ 1916  MLK (NOTE) SARS-CoV-2 target nucleic acids are DETECTED. SARS-CoV-2 RNA is generally detectable in upper respiratory specimens  during the acute phase of infection. Positive results are indicative of the presence of the identified virus, but do not rule out bacterial infection or co-infection with other pathogens not detected by the test. Clinical correlation with patient history and other diagnostic information is necessary to determine patient infection status. The expected result is Negative. Fact Sheet for Patients:  https://www.moore.com/https://www.fda.gov/media/142436/download Fact Sheet for Healthcare Providers: https://www.young.biz/https://www.fda.gov/media/142435/download This test is not yet approved or cleared by the [place] FDA and  has been authorized for detection and/or diagnosis of SARS-CoV-2 by FDA under an Emergency Use Authorization (EUA).  This EUA will remain in effect (meaning this test can be used) for  the duration of  the COVID-19 declaration under Section 564(b)(1) of the Act, 21 U.S.C. section 360bbb-3(b)(1), unless the authorization is terminated or revoked sooner.    Influenza A by PCR NEGATIVE NEGATIVE Final   Influenza B by PCR NEGATIVE NEGATIVE Final    Comment: (NOTE) The Xpert Xpress SARS-CoV-2/FLU/RSV assay is intended as an aid in  the diagnosis of influenza from Nasopharyngeal swab specimens and  should not be used as a sole basis for treatment. Nasal washings and  aspirates are unacceptable for Xpert Xpress SARS-CoV-2/FLU/RSV  testing. Fact Sheet for Patients: https://www.moore.com/https://www.fda.gov/media/142436/download Fact Sheet for Healthcare Providers: https://www.young.biz/https://www.fda.gov/media/142435/download This test is not yet approved or cleared by the [place] FDA and  has been authorized for detection and/or diagnosis of SARS-CoV-2 by  FDA under an  Emergency Use Authorization (EUA). This EUA will remain  in effect (meaning this test can be used) for the duration of the  Covid-19 declaration under Section 564(b)(1) of the Act, 21  U.S.C. section 360bbb-3(b)(1), unless the authorization is  terminated or revoked. Performed at [facility], [address] [place], [place] [address]          Radiology Studies: DG Chest 2 View  Result Date: 08/08/2019 CLINICAL DATA:  Shortness of breath. EXAM: CHEST - 2 VIEW COMPARISON:  Chest x-ray 08/03/2011. FINDINGS: Cardiac pacer with lead tips over the right atrium right ventricle. Cardiomegaly with pulmonary venous congestion. Bibasilar atelectasis. Small bilateral pleural effusions. No pneumothorax. Degenerative change thoracic spine. IMPRESSION: 1. Cardiac pacer noted with lead tips over the right  atrium right ventricle. Cardiomegaly with pulmonary venous congestion. 2. Mild bibasilar atelectasis. Small bilateral pleural effusions. Electronically Signed   By: Maisie Fus  Register   On: 08/08/2019 13:53   US Venous Img Lower Unilateral Right  Result Date: 08/08/2019 CLINICAL DATA:  Right lower extremity pain and edema for the past 2 weeks. Evaluate for DVT. EXAM: RIGHT LOWER EXTREMITY VENOUS DOPPLER ULTRASOUND TECHNIQUE: Gray-scale sonography with graded compression, as well as color Doppler and duplex ultrasound were performed to evaluate the lower extremity deep venous systems from the level of the common femoral vein and including the common femoral, femoral, profunda femoral, popliteal and calf veins including the posterior tibial, peroneal and gastrocnemius veins when visible. The superficial great saphenous vein was also interrogated. Spectral Doppler was utilized to evaluate flow at rest and with distal augmentation maneuvers in the common femoral, femoral and popliteal veins. COMPARISON:  None. FINDINGS: Examination degraded due to patient body habitus and poor sonographic window.  Contralateral Common Femoral Vein: Respiratory phasicity is normal and symmetric with the symptomatic side. No evidence of thrombus. Normal compressibility. Common Femoral Vein: No evidence of thrombus. Normal compressibility, respiratory phasicity and response to augmentation. Saphenofemoral Junction: No evidence of thrombus. Normal compressibility and flow on color Doppler imaging. Profunda Femoral Vein: No evidence of thrombus. Normal compressibility and flow on color Doppler imaging. Femoral Vein: No evidence of thrombus. Normal compressibility, respiratory phasicity and response to augmentation. Popliteal Vein: No evidence of thrombus. Normal compressibility, respiratory phasicity and response to augmentation. Calf Veins: Appear patent where visualized. Superficial Great Saphenous Vein: No evidence of thrombus. Normal compressibility. Venous Reflux:  None. Other Findings:  None. IMPRESSION: No evidence of DVT within the right lower extremity. Electronically Signed   By: Simonne Come M.D.   On: 08/08/2019 16:46   DG Chest Port 1 View  Result Date: 08/10/2019 CLINICAL DATA:  Patient admitted for COVID-19 2 weeks ago.  Hypoxia. EXAM: PORTABLE CHEST 1 VIEW COMPARISON:  August 08, 2019 FINDINGS: Stable cardiomegaly. The hila and mediastinum are normal. Opacity persists in the left base with a probable associated small effusion. Probable pulmonary venous congestion without overt edema. Possible tiny right effusion. Stable pacemaker. IMPRESSION: No interval changes in the small pleural effusions, left greater than right, and left basilar opacity. Probable pulmonary venous congestion. Electronically Signed   By: Gerome Sam III M.D   On: 08/10/2019 08:47   DG Knee Right Port  Result Date: 08/08/2019 CLINICAL DATA:  Right knee pain for 4 weeks EXAM: PORTABLE RIGHT KNEE - 1-2 VIEW COMPARISON:  None. FINDINGS: No acute fracture or malalignment. Mild-moderate medial compartment osteoarthritis. No appreciable knee  joint effusion. Bones appear mildly demineralized. No focal soft tissue abnormality. IMPRESSION: No acute osseous abnormality of the right knee. Electronically Signed   By: Duanne Guess D.O.   On: 08/08/2019 16:07        Scheduled Meds: . vitamin C  500 mg Oral Daily  . benzonatate  200 mg Oral TID  . Chlorhexidine Gluconate Cloth  6 each Topical Daily  . cholecalciferol  1,000 Units Oral Daily  . dexamethasone (DECADRON) injection  6 mg Intravenous Q24H  . dextromethorphan-guaiFENesin  1 tablet Oral BID  . insulin aspart  0-15 Units Subcutaneous TID WC  . insulin aspart  0-5 Units Subcutaneous QHS  . Ipratropium-Albuterol  1 puff Inhalation Q6H  . metoprolol tartrate  12.5 mg Oral BID  . potassium chloride  10 mEq Oral TID  . rivaroxaban  20 mg Oral QPM  .  zinc sulfate  220 mg Oral Daily   Continuous Infusions: .  ceFAZolin (ANCEF) IV 2 g (08/10/19 0556)  . remdesivir 100 mg in NS 100 mL 100 mg (08/10/19 1126)     LOS: 2 days    Time spent: 35 minutes    Sidney Ace, MD [facility] Pager [phone]  If 7PM-7AM, please contact night-coverage 08/10/2019, 12:51 PM

## 2019-08-11 ENCOUNTER — Inpatient Hospital Stay (HOSPITAL_COMMUNITY)
Admit: 2019-08-11 | Discharge: 2019-08-11 | Disposition: A | Discharge status: Home / Self Care | Payer: Medicare Other | Attending: Internal Medicine | Admitting: Internal Medicine

## 2019-08-11 DIAGNOSIS — I5031 Acute diastolic (congestive) heart failure: Secondary | ICD-10-CM

## 2019-08-11 DIAGNOSIS — L0390 Cellulitis, unspecified: Secondary | ICD-10-CM

## 2019-08-11 DIAGNOSIS — J1282 Pneumonia due to coronavirus disease 2019: Secondary | ICD-10-CM

## 2019-08-11 LAB — COMPREHENSIVE METABOLIC PANEL
ALT: 12 U/L (ref 0–44)
AST: 32 U/L (ref 15–41)
Albumin: 1.9 g/dL — ABNORMAL LOW (ref 3.5–5.0)
Alkaline Phosphatase: 66 U/L (ref 38–126)
Anion gap: 9 (ref 5–15)
BUN: 29 mg/dL — ABNORMAL HIGH (ref 8–23)
CO2: 33 mmol/L — ABNORMAL HIGH (ref 22–32)
Calcium: 8.1 mg/dL — ABNORMAL LOW (ref 8.9–10.3)
Chloride: 98 mmol/L (ref 98–111)
Creatinine, Ser: 0.99 mg/dL (ref 0.44–1.00)
GFR calc Af Amer: >60 mL/min (ref >60)
GFR calc non Af Amer: 56 mL/min — ABNORMAL LOW (ref >60)
Glucose, Bld: 314 mg/dL — ABNORMAL HIGH (ref 70–99)
Potassium: 4.7 mmol/L (ref 3.5–5.1)
Sodium: 140 mmol/L (ref 135–145)
Total Bilirubin: 0.9 mg/dL (ref 0.3–1.2)
Total Protein: 6.7 g/dL (ref 6.5–8.1)

## 2019-08-11 LAB — GLUCOSE, CAPILLARY
Glucose-Capillary: 108 mg/dL — ABNORMAL HIGH (ref 70–99)
Glucose-Capillary: 253 mg/dL — ABNORMAL HIGH (ref 70–99)
Glucose-Capillary: 206 mg/dL — ABNORMAL HIGH (ref 70–99)
Glucose-Capillary: 128 mg/dL — ABNORMAL HIGH (ref 70–99)
Glucose-Capillary: 193 mg/dL — ABNORMAL HIGH (ref 70–99)

## 2019-08-11 LAB — FIBRIN DERIVATIVES D-DIMER (ARMC ONLY): Fibrin derivatives D-dimer (ARMC): 1635.49 ng/mL (FEU) — ABNORMAL HIGH (ref 0.00–499.00)

## 2019-08-11 LAB — CBC WITH DIFFERENTIAL/PLATELET
Abs Immature Granulocytes: 0.18 10*3/uL — ABNORMAL HIGH (ref 0.00–0.07)
Basophils Absolute: 0.1 10*3/uL (ref 0.0–0.1)
Basophils Relative: 1 %
Eosinophils Absolute: 0 10*3/uL (ref 0.0–0.5)
Eosinophils Relative: 0 %
HCT: 42.1 % (ref 36.0–46.0)
Hemoglobin: 13.1 g/dL (ref 12.0–15.0)
Immature Granulocytes: 2 %
Lymphocytes Relative: 9 %
Lymphs Abs: 0.9 10*3/uL (ref 0.7–4.0)
MCH: 26.8 pg (ref 26.0–34.0)
MCHC: 31.1 g/dL (ref 30.0–36.0)
MCV: 86.1 fL (ref 80.0–100.0)
Monocytes Absolute: 0.4 10*3/uL (ref 0.1–1.0)
Monocytes Relative: 4 %
Neutro Abs: 8 10*3/uL — ABNORMAL HIGH (ref 1.7–7.7)
Neutrophils Relative %: 84 %
Platelets: 332 10*3/uL (ref 150–400)
RBC: 4.89 MIL/uL (ref 3.87–5.11)
RDW: 14.6 % (ref 11.5–15.5)
WBC: 9.5 10*3/uL (ref 4.0–10.5)
nRBC: 0 % (ref 0.0–0.2)

## 2019-08-11 LAB — C-REACTIVE PROTEIN: CRP: 9.3 mg/dL — ABNORMAL HIGH (ref <1.0)

## 2019-08-11 LAB — ECHOCARDIOGRAM COMPLETE
Height: 64 in
Weight: 4924.19 oz

## 2019-08-11 LAB — MAGNESIUM: Magnesium: 2 mg/dL (ref 1.7–2.4)

## 2019-08-11 LAB — PHOSPHORUS: Phosphorus: 4.6 mg/dL (ref 2.5–4.6)

## 2019-08-11 LAB — FERRITIN: Ferritin: 177 ng/mL (ref 11–307)

## 2019-08-11 MED ORDER — INSULIN GLARGINE 100 UNIT/ML ~~LOC~~ SOLN
10.0000 [IU] | Freq: Every day | SUBCUTANEOUS | Status: DC
  Administered 2019-08-11: 21:00:00 10 [IU] via SUBCUTANEOUS
  Filled 2019-08-11 (×2): qty 0.1

## 2019-08-11 MED ORDER — POLYETHYLENE GLYCOL 3350 17 G PO PACK
17.0000 g | PACK | Freq: Every day | ORAL | Status: DC
  Filled 2019-08-11: qty 1

## 2019-08-11 MED ORDER — BISACODYL 10 MG RE SUPP: 10.0000 mg | Freq: Every day | RECTAL | Status: DC | PRN

## 2019-08-11 MED ORDER — FUROSEMIDE 10 MG/ML IJ SOLN
40.0000 mg | Freq: Every day | INTRAMUSCULAR | Status: DC
  Administered 2019-08-11: 40 mg via INTRAVENOUS
  Filled 2019-08-11: qty 4

## 2019-08-11 NOTE — Progress Notes (Signed)
Pt ambulated on RA. And did not drop below 93% on room air while ambulating or at rest.

## 2019-08-11 NOTE — Progress Notes (Signed)
PROGRESS NOTE    Jennifer Gregory  ZOX:[id number] DOB: 1945/11/12 DOA: 08/08/2019 PCP: Rayetta Humphrey, MD   Brief Narrative:  74 y.o.femalewith a known history of recent diagnosis of a Covid infection in [place] on 07/22/2019. She has not been feeling well at home. She has been feeling very fatigued very weak. She has been having some cough and some shortness of breath. She has been having poor appetite and some nausea. Only had a couple episodes of diarrhea. No abdominal pain she also bumped her leg and thought it was a bruise but she has noticed some redness on her right leg going on for about a week. She has been taking her Xarelto regularly but had not been taking her Lasix or glipizide. In the ER she was found to have an elevated D-dimer. She had a sonogram of the lower extremity that was negative for DVT.  Patient refused to take IV Lasix without Foley catheter which was placed on 08/10/2018, stating that Lasix makes her incontinent.  Subjective: Patient had a lot of complaints mostly regarding nursing care as she has to wait longer for her response whenever she presses the button.  We discussed regarding discontinuation of Foley catheter and using pure wick as needed for incontinence to prevent a potential infection.  Assessment & Plan:   Active Problems:   Acute hypoxemic respiratory failure due to COVID-19 (HCC)   Pressure injury of skin  Acute hypoxic respiratory failure secondary to COVID-19 infection Repeat Covid tests positive Patient started on remdesivir/steroids on admission Remains on 2 L nasal cannula Suspect combination of Covid infection and acute decompensated heart failure Plan: Continue remdesivir (day 4/5)  Continue Decadron (day 4/10) Trend inflammatory markers Vitamin C, vitamin D, zinc supplementation Inhaler, with nebulizer Try weaning her off from oxygen.  Acute on chronic diastolic congestive heart failure Likely secondary to medication  nonadherence Patient states that she has not taken her Lasix in approximately week Chest x-ray notable for vascular congestion Repeat chest x-ray on 08/10/2019 demonstrates continued pulmonary vascular congestion.  Echocardiogram done today with low normal ejection fraction, diastolic functions were indeterminate, mildly elevated pulmonary pressure and more than 50% respiratory variability in IVC. He was given Lasix 40 mg IV twice daily yesterday, had good urine output and a decrease of 1 pound in her weight since 08/08/2019.  No weight check in between. -Crease Lasix to 40 mg daily. -Continue to monitor strict intake and output and daily weights. -Discontinue Foley.  Atrial fibrillation with rapid ventricular response, improved Improved rate control over interval. Likely paroxysmal nature. -Continue metoprolol and Xarelto.  Cellulitis right lower extremity with leukocytosis.  Mild, nonpurulent Continue Ancef, for 1 more day to complete a 5-day course.  Type 2 diabetes mellitus.  Well-controlled with A1c of 6.7. CBG elevated as patient is on steroid. -Add Lantus 10 units at bedtime. -Continue sliding scale coverage  Morbid obesity BMI of 52.87.  Counseled patient.   Objective: Vitals:   08/11/19 0031 08/11/19 0500 08/11/19 0810 08/11/19 1300  BP: (!) 110/53  109/78   Pulse: 98  91 91  Resp: (!) 23  19 (!) 21  Temp: 98.1 F (36.7 C)  97.6 F (36.4 C)   TempSrc: Axillary  Axillary   SpO2: 94%  96% 92%  Weight:  (!) 139.6 kg    Height:        Intake/Output Summary (Last 24 hours) at 08/11/2019 1400 Last data filed at 08/11/2019 1200 Gross per 24 hour  Intake 1154.9 ml  Output 4550 ml  Net -3395.1 ml   Filed Weights   08/08/19 1317 08/11/19 0500  Weight: (!) 139.7 kg (!) 139.6 kg    Examination:  General exam: Well-developed, obese, unpleasant elderly lady, appears comfortable  Respiratory system: Clear to auscultation. Respiratory effort normal. Cardiovascular  system: S1 & S2 heard, RRR. No JVD, murmurs, rubs, gallops or clicks. Gastrointestinal system: Soft, nontender, nondistended, bowel sounds positive. Central nervous system: Alert and oriented. No focal neurological deficits.Symmetric 5 x 5 power. Extremities: Trace LE edema with signs of chronic venous dermatitis, no cyanosis, pulses intact and symmetrical. Psychiatry: Judgement and insight appear normal.    DVT prophylaxis: Xarelto Code Status: Full Family Communication: Son was updated on phone. Disposition Plan: Pending weaning off from oxygen and completion of remdesivir.  If able to wean her off by tomorrow she can be discharged tomorrow as she will complete her course of remdesivir.  Consultants:   None  Procedures:  Antimicrobials:  Cefazolin  Data Reviewed: I have personally reviewed following labs and imaging studies  CBC: Recent Labs  Lab 08/08/19 1413 08/09/19 0731 08/10/19 0548 08/11/19 0444  WBC 14.2* 12.0* 10.7* 9.5  NEUTROABS 11.5* 10.4* 9.3* 8.0*  HGB 12.9 12.4 13.1 13.1  HCT 42.0 40.7 42.4 42.1  MCV 86.8 87.5 86.0 86.1  PLT 297 302 312 332   Basic Metabolic Panel: Recent Labs  Lab 08/08/19 1538 08/09/19 0731 08/10/19 0548 08/11/19 0444  NA 136 138 138 140  K 4.8 4.7 4.2 4.7  CL 100 100 98 98  CO2 25 28 32 33*  GLUCOSE 192* 258* 253* 314*  BUN 14 16 23  29*  CREATININE 0.89 0.89 0.84 0.99  CALCIUM 8.2* 8.3* 8.3* 8.1*  MG  --  2.1 1.9 2.0  PHOS  --  4.0 5.3* 4.6   GFR: Estimated Creatinine Clearance: 69.8 mL/min (by C-G formula based on SCr of 0.99 mg/dL). Liver Function Tests: Recent Labs  Lab 08/08/19 1538 08/09/19 0731 08/10/19 0548 08/11/19 0444  AST 36 30 28 32  ALT 17 16 13 12   ALKPHOS 75 78 67 66  BILITOT 1.8* 0.9 1.0 0.9  PROT 7.3 6.7 7.1 6.7  ALBUMIN 2.3* 1.9* 2.1* 1.9*   No results for input(s): LIPASE, AMYLASE in the last 168 hours. No results for input(s): AMMONIA in the last 168 hours. Coagulation Profile: No results  for input(s): INR, PROTIME in the last 168 hours. Cardiac Enzymes: No results for input(s): CKTOTAL, CKMB, CKMBINDEX, TROPONINI in the last 168 hours. BNP (last 3 results) No results for input(s): PROBNP in the last 8760 hours. HbA1C: Recent Labs    08/09/19 0731  HGBA1C 6.7*   CBG: Recent Labs  Lab 08/10/19 0742 08/10/19 1156 08/10/19 1632 08/11/19 0815 08/11/19 1143  GLUCAP 254* 199* 250* 253* 206*   Lipid Profile: No results for input(s): CHOL, HDL, LDLCALC, TRIG, CHOLHDL, LDLDIRECT in the last 72 hours. Thyroid Function Tests: No results for input(s): TSH, T4TOTAL, FREET4, T3FREE, THYROIDAB in the last 72 hours. Anemia Panel: Recent Labs    08/10/19 0548 08/11/19 0444  FERRITIN 232 177   Sepsis Labs: Recent Labs  Lab 08/08/19 1538  PROCALCITON 0.55  LATICACIDVEN 1.8    Recent Results (from the past 240 hour(s))  Blood culture (routine x 2)     Status: None (Preliminary result)   Collection Time: 08/08/19  3:37 PM   Specimen: BLOOD  Result Value Ref Range Status   Specimen Description BLOOD BLOOD RIGHT HAND  Final  Special Requests   Final    BOTTLES DRAWN AEROBIC AND ANAEROBIC Blood Culture adequate volume   Culture   Final    NO GROWTH 3 DAYS Performed at [facility], [address] [place], [place] [address]    Report Status PENDING  Incomplete  Blood culture (routine x 2)     Status: None (Preliminary result)   Collection Time: 08/08/19  5:22 PM   Specimen: BLOOD  Result Value Ref Range Status   Specimen Description BLOOD BLOOD RIGHT HAND  Final   Special Requests   Final    BOTTLES DRAWN AEROBIC AND ANAEROBIC Blood Culture adequate volume   Culture   Final    NO GROWTH 3 DAYS Performed at [facility], [address] [place], [place] [address]    Report Status PENDING  Incomplete  Respiratory Panel by RT PCR (Flu A&B, Covid) - Nasopharyngeal Swab     Status: Abnormal   Collection Time: 08/08/19  6:01 PM   Specimen:  Nasopharyngeal Swab  Result Value Ref Range Status   SARS Coronavirus 2 by RT PCR POSITIVE (A) NEGATIVE Final    Comment: RESULT CALLED TO, READ BACK BY AND VERIFIED WITH: KATE BUCKNUM 08/08/19 @ 1916  MLK (NOTE) SARS-CoV-2 target nucleic acids are DETECTED. SARS-CoV-2 RNA is generally detectable in upper respiratory specimens  during the acute phase of infection. Positive results are indicative of the presence of the identified virus, but do not rule out bacterial infection or co-infection with other pathogens not detected by the test. Clinical correlation with patient history and other diagnostic information is necessary to determine patient infection status. The expected result is Negative. Fact Sheet for Patients:  https://www.moore.com/ Fact Sheet for Healthcare Providers: https://www.young.biz/ This test is not yet approved or cleared by the [place] FDA and  has been authorized for detection and/or diagnosis of SARS-CoV-2 by FDA under an Emergency Use Authorization (EUA).  This EUA will remain in effect (meaning this test can be used) for  the duration of  the COVID-19 declaration under Section 564(b)(1) of the Act, 21 U.S.C. section 360bbb-3(b)(1), unless the authorization is terminated or revoked sooner.    Influenza A by PCR NEGATIVE NEGATIVE Final   Influenza B by PCR NEGATIVE NEGATIVE Final    Comment: (NOTE) The Xpert Xpress SARS-CoV-2/FLU/RSV assay is intended as an aid in  the diagnosis of influenza from Nasopharyngeal swab specimens and  should not be used as a sole basis for treatment. Nasal washings and  aspirates are unacceptable for Xpert Xpress SARS-CoV-2/FLU/RSV  testing. Fact Sheet for Patients: https://www.moore.com/ Fact Sheet for Healthcare Providers: https://www.young.biz/ This test is not yet approved or cleared by the [place] FDA and  has been authorized for  detection and/or diagnosis of SARS-CoV-2 by  FDA under an Emergency Use Authorization (EUA). This EUA will remain  in effect (meaning this test can be used) for the duration of the  Covid-19 declaration under Section 564(b)(1) of the Act, 21  U.S.C. section 360bbb-3(b)(1), unless the authorization is  terminated or revoked. Performed at [facility], [address] [place], [place] [address]      Radiology Studies: [facility] Chest [place] 1 View  Result Date: 08/10/2019 CLINICAL DATA:  Patient admitted for COVID-19 2 weeks ago.  Hypoxia. EXAM: PORTABLE CHEST 1 VIEW COMPARISON:  August 08, 2019 FINDINGS: Stable cardiomegaly. The hila and mediastinum are normal. Opacity persists in the left base with a probable associated small effusion. Probable pulmonary venous congestion without  overt edema. Possible tiny right effusion. Stable pacemaker. IMPRESSION: No interval changes in the small pleural effusions, left greater than right, and left basilar opacity. Probable pulmonary venous congestion. Electronically Signed   By: Gerome Sam III M.D   On: 08/10/2019 08:47   ECHOCARDIOGRAM COMPLETE  Result Date: 08/11/2019    ECHOCARDIOGRAM REPORT   Patient Name:   Jennifer Gregory Date of Exam: 08/11/2019 Medical Rec #:  [id number]     Height:       64.0 in Accession #:    [id number]    Weight:       307.8 lb Date of Birth:  18-Oct-1945     BSA:          2.350 m Patient Age:    73 years      BP:           110/53 mmHg Patient Gender: F             HR:           74 bpm. Exam Location:  [facility] Procedure: 2D Echo, Color Doppler and Cardiac Doppler Indications:     I50.31 CHF-Acute Diastolic  History:         Patient has no prior history of Echocardiogram examinations.                  CHF, Arrythmias:Atrial Fibrillation; Risk Factors:Hypertension                  and Diabetes. Pt tested positive for COVID-19 on 08/08/19.  Sonographer:     Humphrey Rolls RDCS (AE) Referring Phys:  [id number] Alford Highland Diagnosing Phys:  Lorine Bears MD  Sonographer Comments: Suboptimal apical window and no subcostal window. Image acquisition challenging due to patient body habitus. IMPRESSIONS  1. Left ventricular ejection fraction, by estimation, is 50 to 55%. The left ventricle has low normal function. Left ventricular endocardial border not optimally defined to evaluate regional wall motion. There is mild left ventricular hypertrophy. Left ventricular diastolic parameters are indeterminate.  2. Right ventricular systolic function is normal. The right ventricular size is normal. There is mildly elevated pulmonary artery systolic pressure.  3. Left atrial size was mild to moderately dilated.  4. The mitral valve is normal in structure. Moderate mitral valve regurgitation. No evidence of mitral stenosis.  5. The aortic valve is normal in structure. Aortic valve regurgitation is not visualized. Mild to moderate aortic valve sclerosis/calcification is present, without any evidence of aortic stenosis.  6. The inferior vena cava is dilated in size with >50% respiratory variability, suggesting right atrial pressure of 8 mmHg. FINDINGS  Left Ventricle: Left ventricular ejection fraction, by estimation, is 50 to 55%. The left ventricle has low normal function. Left ventricular endocardial border not optimally defined to evaluate regional wall motion. The left ventricular internal cavity  size was normal in size. There is mild left ventricular hypertrophy. Left ventricular diastolic parameters are indeterminate. Right Ventricle: The right ventricular size is normal. No increase in right ventricular wall thickness. Right ventricular systolic function is normal. There is mildly elevated pulmonary artery systolic pressure. The tricuspid regurgitant velocity is 2.35  m/s, and with an assumed right atrial pressure of 10 mmHg, the estimated right ventricular systolic pressure is 32.1 mmHg. Left Atrium: Left atrial size was mild to moderately dilated. Right  Atrium: Right atrial size was normal in size. Pericardium: There is no evidence of pericardial effusion. Mitral Valve: The mitral valve is normal in structure. Normal mobility  of the mitral valve leaflets. Moderate mitral annular calcification. Moderate mitral valve regurgitation. No evidence of mitral valve stenosis. MV peak gradient, 5.5 mmHg. The mean mitral valve gradient is 3.0 mmHg. Tricuspid Valve: The tricuspid valve is normal in structure. Tricuspid valve regurgitation is trivial. No evidence of tricuspid stenosis. Aortic Valve: The aortic valve is normal in structure. Aortic valve regurgitation is not visualized. Mild to moderate aortic valve sclerosis/calcification is present, without any evidence of aortic stenosis. Aortic valve mean gradient measures 3.0 mmHg. Aortic valve peak gradient measures 6.8 mmHg. Aortic valve area, by VTI measures 2.86 cm. Pulmonic Valve: The pulmonic valve was normal in structure. Pulmonic valve regurgitation is mild. No evidence of pulmonic stenosis. Aorta: The aortic root is normal in size and structure. Venous: The inferior vena cava is dilated in size with greater than 50% respiratory variability, suggesting right atrial pressure of 8 mmHg. IAS/Shunts: No atrial level shunt detected by color flow Doppler.  LEFT VENTRICLE PLAX 2D LVIDd:         4.95 cm  Diastology LVIDs:         3.44 cm  LV e' lateral:   10.70 cm/s LV PW:         1.14 cm  LV E/e' lateral: 10.1 LV IVS:        0.78 cm  LV e' medial:    6.42 cm/s LVOT diam:     2.20 cm  LV E/e' medial:  16.9 LV SV:         62 LV SV Index:   27 LVOT Area:     3.80 cm  LEFT ATRIUM         Index LA diam:    5.50 cm 2.34 cm/m  AORTIC VALVE                   PULMONIC VALVE AV Area (Vmax):    2.24 cm    PV Vmax:       0.94 m/s AV Area (Vmean):   2.59 cm    PV Vmean:      67.900 cm/s AV Area (VTI):     2.86 cm    PV VTI:        0.159 m AV Vmax:           130.00 cm/s PV Peak grad:  3.5 mmHg AV Vmean:          85.300 cm/s PV Mean  grad:  2.0 mmHg AV VTI:            0.218 m AV Peak Grad:      6.8 mmHg AV Mean Grad:      3.0 mmHg LVOT Vmax:         76.60 cm/s LVOT Vmean:        58.200 cm/s LVOT VTI:          0.164 m LVOT/AV VTI ratio: 0.75  AORTA Ao Root diam: 3.40 cm MITRAL VALVE                TRICUSPID VALVE MV Area (PHT): 3.45 cm     TR Peak grad:   22.1 mmHg MV Peak grad:  5.5 mmHg     TR Vmax:        235.00 cm/s MV Mean grad:  3.0 mmHg MV Vmax:       1.17 m/s     SHUNTS MV Vmean:      76.0 cm/s    Systemic VTI:  0.16 m MV  Decel Time: 220 msec     Systemic Diam: 2.20 cm MV E velocity: 108.33 cm/s Kathlyn Sacramento MD Electronically signed by Kathlyn Sacramento MD Signature Date/Time: 08/11/2019/10:12:35 AM    Final     Scheduled Meds: . vitamin C  500 mg Oral Daily  . benzonatate  200 mg Oral TID  . Chlorhexidine Gluconate Cloth  6 each Topical Daily  . cholecalciferol  1,000 Units Oral Daily  . dexamethasone (DECADRON) injection  6 mg Intravenous Q24H  . dextromethorphan-guaiFENesin  1 tablet Oral BID  . furosemide  40 mg Intravenous Daily  . insulin aspart  0-15 Units Subcutaneous TID WC  . insulin aspart  0-5 Units Subcutaneous QHS  . Ipratropium-Albuterol  1 puff Inhalation Q6H  . metoprolol tartrate  12.5 mg Oral BID  . potassium chloride  10 mEq Oral TID  . rivaroxaban  20 mg Oral QPM  . zinc sulfate  220 mg Oral Daily   Continuous Infusions: .  ceFAZolin (ANCEF) IV 2 g (08/11/19 1327)  . remdesivir 100 mg in NS 100 mL 100 mg (08/11/19 0916)     LOS: 3 days   Time spent: 40 minutes  Lorella Nimrod, MD [facility]  If 7PM-7AM, please contact night-coverage [email or web address]  08/11/2019, 2:00 PM   This record has been created using Systems analyst. Errors have been sought and corrected,but may not always be located. Such creation errors do not reflect on the standard of care.

## 2019-08-11 NOTE — Progress Notes (Signed)
MD notified on rounds that foley needs to be [facility]

## 2019-08-11 NOTE — Progress Notes (Signed)
*  PRELIMINARY RESULTS* Echocardiogram 2D Echocardiogram has been performed.  Jennifer Gregory 08/11/2019, 8:53 AM

## 2019-08-11 NOTE — Care Management Important Message (Signed)
Important Message  Patient Details  Name: Jennifer Gregory MRN: [id number] Date of Birth: Apr 04, 1946   Medicare Important Message Given:  Yes     Elliot Gault, LCSW 08/11/2019, 1:19 PM

## 2019-08-12 DIAGNOSIS — R0902 Hypoxemia: Secondary | ICD-10-CM

## 2019-08-12 LAB — PHOSPHORUS: Phosphorus: 3.8 mg/dL (ref 2.5–4.6)

## 2019-08-12 LAB — COMPREHENSIVE METABOLIC PANEL
ALT: 11 U/L (ref 0–44)
AST: 40 U/L (ref 15–41)
Albumin: 2.1 g/dL — ABNORMAL LOW (ref 3.5–5.0)
Alkaline Phosphatase: 69 U/L (ref 38–126)
Anion gap: 9 (ref 5–15)
BUN: 26 mg/dL — ABNORMAL HIGH (ref 8–23)
CO2: 33 mmol/L — ABNORMAL HIGH (ref 22–32)
Calcium: 8.2 mg/dL — ABNORMAL LOW (ref 8.9–10.3)
Chloride: 97 mmol/L — ABNORMAL LOW (ref 98–111)
Creatinine, Ser: 0.8 mg/dL (ref 0.44–1.00)
GFR calc Af Amer: >60 mL/min (ref >60)
GFR calc non Af Amer: >60 mL/min (ref >60)
Glucose, Bld: 266 mg/dL — ABNORMAL HIGH (ref 70–99)
Potassium: 4.5 mmol/L (ref 3.5–5.1)
Sodium: 139 mmol/L (ref 135–145)
Total Bilirubin: 0.8 mg/dL (ref 0.3–1.2)
Total Protein: 6.8 g/dL (ref 6.5–8.1)

## 2019-08-12 LAB — CBC WITH DIFFERENTIAL/PLATELET
Abs Immature Granulocytes: 0.13 10*3/uL — ABNORMAL HIGH (ref 0.00–0.07)
Basophils Absolute: 0 10*3/uL (ref 0.0–0.1)
Basophils Relative: 0 %
Eosinophils Absolute: 0 10*3/uL (ref 0.0–0.5)
Eosinophils Relative: 0 %
HCT: 42.5 % (ref 36.0–46.0)
Hemoglobin: 13.2 g/dL (ref 12.0–15.0)
Immature Granulocytes: 2 %
Lymphocytes Relative: 10 %
Lymphs Abs: 0.9 10*3/uL (ref 0.7–4.0)
MCH: 26.8 pg (ref 26.0–34.0)
MCHC: 31.1 g/dL (ref 30.0–36.0)
MCV: 86.4 fL (ref 80.0–100.0)
Monocytes Absolute: 0.3 10*3/uL (ref 0.1–1.0)
Monocytes Relative: 4 %
Neutro Abs: 7.2 10*3/uL (ref 1.7–7.7)
Neutrophils Relative %: 84 %
Platelets: 329 10*3/uL (ref 150–400)
RBC: 4.92 MIL/uL (ref 3.87–5.11)
RDW: 14.5 % (ref 11.5–15.5)
WBC: 8.6 10*3/uL (ref 4.0–10.5)
nRBC: 0 % (ref 0.0–0.2)

## 2019-08-12 LAB — FIBRIN DERIVATIVES D-DIMER (ARMC ONLY): Fibrin derivatives D-dimer (ARMC): 1079.71 ng/mL (FEU) — ABNORMAL HIGH (ref 0.00–499.00)

## 2019-08-12 LAB — MAGNESIUM: Magnesium: 2.1 mg/dL (ref 1.7–2.4)

## 2019-08-12 LAB — GLUCOSE, CAPILLARY
Glucose-Capillary: 262 mg/dL — ABNORMAL HIGH (ref 70–99)
Glucose-Capillary: 174 mg/dL — ABNORMAL HIGH (ref 70–99)

## 2019-08-12 LAB — C-REACTIVE PROTEIN: CRP: 5.7 mg/dL — ABNORMAL HIGH (ref <1.0)

## 2019-08-12 LAB — FERRITIN: Ferritin: 167 ng/mL (ref 11–307)

## 2019-08-12 MED ORDER — FUROSEMIDE 40 MG PO TABS: 40.0000 mg | ORAL_TABLET | Freq: Every day | ORAL | Status: DC

## 2019-08-12 MED ORDER — ASCORBIC ACID 500 MG PO TABS: 500.0000 mg | ORAL_TABLET | Freq: Every day | ORAL | 0 refills | Status: DC

## 2019-08-12 MED ORDER — VITAMIN D3 25 MCG PO TABS: 1000.0000 [IU] | ORAL_TABLET | Freq: Every day | ORAL | 0 refills | Status: DC

## 2019-08-12 MED ORDER — BENZONATATE 200 MG PO CAPS: 200.0000 mg | ORAL_CAPSULE | Freq: Three times a day (TID) | ORAL | 0 refills | Status: DC

## 2019-08-12 MED ORDER — DEXAMETHASONE 6 MG PO TABS: 6.0000 mg | ORAL_TABLET | Freq: Every day | ORAL | 0 refills | Status: AC

## 2019-08-12 MED ORDER — IPRATROPIUM-ALBUTEROL 20-100 MCG/ACT IN AERS: 1.0000 | INHALATION_SPRAY | Freq: Four times a day (QID) | RESPIRATORY_TRACT | 0 refills | Status: DC

## 2019-08-12 MED ORDER — INSULIN GLARGINE 100 UNIT/ML ~~LOC~~ SOLN
13.0000 [IU] | Freq: Every day | SUBCUTANEOUS | Status: DC
  Filled 2019-08-12: qty 0.13

## 2019-08-12 MED ORDER — ZINC SULFATE 220 (50 ZN) MG PO CAPS: 220.0000 mg | ORAL_CAPSULE | Freq: Every day | ORAL | 0 refills | Status: DC

## 2019-08-12 NOTE — Progress Notes (Signed)
Pt AOX4. Discharge instructions reviewed using teachback. Oxygen concentrator at bedside and now attached to patient. Pt given instruction on how to turn on oxygen concentrator and to keep plugged in continuously to wall or car using enclosed chargers. PIV removed and patient sent home with all belongings at bedside.

## 2019-08-12 NOTE — Progress Notes (Signed)
Pt declines lasix at this time, says she wants to take it on her home schedule and so would like to take it at 4pm. Pt declines miralax and says she will take later "If I want it". Last BM yesterday.

## 2019-08-12 NOTE — Progress Notes (Signed)
Inpatient Diabetes Program Recommendations  AACE/ADA: New Consensus Statement on Inpatient Glycemic Control   Target Ranges:  Prepandial:   less than 140 mg/dL      Peak postprandial:   less than 180 mg/dL (1-2 hours)      Critically ill patients:  140 - 180 mg/dL   Results for Jennifer Gregory, Jennifer Gregory (MRN [id number]) as of 08/12/2019 11:35  Ref. Range 08/11/2019 08:15 08/11/2019 11:43 08/11/2019 16:42 08/11/2019 20:49 08/12/2019 07:58  Glucose-Capillary Latest Ref Range: 70 - 99 mg/dL 779 (H) 396 (H) 886 (H) 193 (H) 262 (H)   Review of Glycemic Control  Diabetes history: DM2 Outpatient Diabetes medications: Glipizide XL 5 mg daily Current orders for Inpatient glycemic control: Lantus 10 units QHS, Novolog 0-15 units TID with meals, Novolog 0-5 units QHS; Decadron 6 mg Q24H  Inpatient Diabetes Program Recommendations:   Insulin - Basal: If steroids are continued, please consider increasing Lantus to 13 units QHS.  Thanks, Orlando Penner, RN, MSN, CDE Diabetes Coordinator Inpatient Diabetes Program [phone] (Team Pager from 8am to 5pm)

## 2019-08-12 NOTE — Discharge Summary (Signed)
Physician Discharge Summary  [name] JJH:[id number] DOB: Aug 09, 1945 DOA: 08/08/2019  PCP: Rayetta Humphrey, MD  Admit date: 08/08/2019 Discharge date: 08/12/2019  Admitted From: Home Disposition: Home  Recommendations for Outpatient Follow-up:  1. Follow up with PCP in 1-2 weeks 2. Please obtain BMP/CBC in one week 3. Please follow up on the following pending results: None  Home Health: No Equipment/Devices: Home oxygen Discharge Condition stable CODE STATUS: Full Diet recommendation: Heart Healthy / Carb Modified   Brief/Interim Summary: 74 y.o.femalewith a known history of recent diagnosis of a Covid infection in [place] on 07/22/2019. She has not been feeling well at home. She has been feeling very fatigued very weak. She has been having some cough and some shortness of breath. She has been having poor appetite and some nausea. Only had a couple episodes of diarrhea. No abdominal pain she also bumped her leg and thought it was a bruise but she has noticed some redness on her right leg going on for about a week. She has been taking her Xarelto regularly but had not been taking her Lasix or glipizide. In the ER she was found to have an elevated D-dimer. She had a sonogram of the lower extremity that was negative for DVT.   There was some concern of acute on chronic heart failure, patient refused to take IV Lasix without Foley catheter which was placed for 24-hour.  Patient was switched to p.o. Lasix after 24 hours.  Repeat echocardiogram during current hospitalization with low normal ejection fraction, diastolic functions were indeterminate and mildly elevated pulmonary pressures with more than 50% respiratory variability in IVC.   Her repeat Covid test were positive.  She was treated with remdesivir and steroid due to hypoxia and decompensated heart failure.  She completed the course of remdesivir and discharged on Decadron for another 5 days.  She will continue with  supportive therapy as needed. Patient continued to require 2 L and was discharged with home oxygen.  Patient did had A. fib with RVR initially which improved.  Patient was in sinus rhythm with rate control.  She will continue her home dose of metoprolol and Xarelto.  There was some concern of right lower extremity cellulitis it was treated with 5-day course of Ancef.  Patient has well-controlled diabetes with A1c of 6.7.  CBG elevated secondary to steroid use.  Managed with Lantus and SSI during hospitalization and she will resume her home meds on discharge.  Patient has morbid obesity with BMI of 52.87 which can also affect her overall prognosis.  She was counseled for weight loss.  Discharge Diagnoses:  Active Problems:   Pneumonia due to COVID-19 virus   Pressure injury of skin   Cellulitis   Hypoxia  Discharge Instructions  Discharge Instructions    Diet - low sodium heart healthy   Complete by: As directed    Discharge instructions   Complete by: As directed    It was pleasure taking care of you. Please take steroid for another 5 days, monitor your blood sugar glucose level carefully as steroid can increase your sugar level. Use oxygen 2 L all the time especially when moving around.  Continue to check your pulse ox and follow-up with your primary care physician for further management.   Increase activity slowly   Complete by: As directed    MyChart COVID-19 home monitoring program   Complete by: Aug 12, 2019    Is the patient willing to use the MyChart Mobile App for  home monitoring?: Yes     Allergies as of 08/12/2019      Reactions   Aspirin Other (See Comments)   Bleeding risk since patient is on xarelta   Ciprofloxacin Other (See Comments)   Other Reaction: RASH ITCHING   Codeine Other (See Comments)   Diazepam Other (See Comments), Nausea And Vomiting   Dofetilide Other (See Comments)   Other Reaction: arrhythmia   Hydrocodone-acetaminophen Other (See Comments),  Nausea And Vomiting   Morphine Nausea And Vomiting   Niacin Other (See Comments)   fatigue   Oxycodone-acetaminophen Other (See Comments), Nausea And Vomiting   Sotalol Other (See Comments)   Other Reaction: Prolonged QTc   Amiodarone Other (See Comments)   Affecting liver   Pyridoxine Other (See Comments)   Splitting headache   Sulfamethoxazole-trimethoprim Swelling      Medication List    TAKE these medications   ascorbic acid 500 MG tablet Commonly known as: VITAMIN C Take 1 tablet (500 mg total) by mouth daily. Start taking on: August 13, 2019   benzonatate 200 MG capsule Commonly known as: TESSALON Take 1 capsule (200 mg total) by mouth 3 (three) times daily.   dexamethasone 6 MG tablet Commonly known as: DECADRON Take 1 tablet (6 mg total) by mouth daily for 5 days.   furosemide 40 MG tablet Commonly known as: LASIX Take 80 mg by mouth daily. (take at about 4PM)   glipiZIDE 5 MG 24 hr tablet Commonly known as: GLUCOTROL XL Take 5 mg by mouth daily.   Ipratropium-Albuterol 20-100 MCG/ACT Aers respimat Commonly known as: COMBIVENT Inhale 1 puff into the lungs every 6 (six) hours.   metoprolol succinate 25 MG 24 hr tablet Commonly known as: TOPROL-XL Take 12.5 mg by mouth daily.   potassium chloride 10 MEQ tablet Commonly known as: KLOR-CON Take 20 mEq by mouth 3 (three) times daily.   Vitamin D3 25 MCG tablet Commonly known as: Vitamin D Take 1 tablet (1,000 Units total) by mouth daily. Start taking on: August 13, 2019   Xarelto 20 MG Tabs tablet Generic drug: rivaroxaban Take 20 mg by mouth every evening.   zinc sulfate 220 (50 Zn) MG capsule Take 1 capsule (220 mg total) by mouth daily. Start taking on: August 13, 2019            Durable Medical Equipment  (From admission, onward)         Start     Ordered   08/12/19 1359  For home use only DME oxygen  Once    Question Answer Comment  Length of Need Lifetime   Mode or (Route) Nasal  cannula   Liters per Minute 2   Frequency Continuous (stationary and portable oxygen unit needed)   Oxygen conserving device Yes   Oxygen delivery system Gas      08/12/19 1359          Allergies  Allergen Reactions  . Aspirin Other (See Comments)    Bleeding risk since patient is on xarelta  . Ciprofloxacin Other (See Comments)    Other Reaction: RASH ITCHING   . Codeine Other (See Comments)  . Diazepam Other (See Comments) and Nausea And Vomiting  . Dofetilide Other (See Comments)    Other Reaction: arrhythmia   . Hydrocodone-Acetaminophen Other (See Comments) and Nausea And Vomiting  . Morphine Nausea And Vomiting  . Niacin Other (See Comments)    fatigue  . Oxycodone-Acetaminophen Other (See Comments) and Nausea And Vomiting  .  Sotalol Other (See Comments)    Other Reaction: Prolonged QTc   . Amiodarone Other (See Comments)    Affecting liver   . Pyridoxine Other (See Comments)    Splitting headache  . Sulfamethoxazole-Trimethoprim Swelling    Consultations:  None  Procedures/Studies: DG Chest 2 View  Result Date: 08/08/2019 CLINICAL DATA:  Shortness of breath. EXAM: CHEST - 2 VIEW COMPARISON:  Chest x-ray 08/03/2011. FINDINGS: Cardiac pacer with lead tips over the right atrium right ventricle. Cardiomegaly with pulmonary venous congestion. Bibasilar atelectasis. Small bilateral pleural effusions. No pneumothorax. Degenerative change thoracic spine. IMPRESSION: 1. Cardiac pacer noted with lead tips over the right atrium right ventricle. Cardiomegaly with pulmonary venous congestion. 2. Mild bibasilar atelectasis. Small bilateral pleural effusions. Electronically Signed   By: Maisie Fus  Register   On: 08/08/2019 13:53   US Venous Img Lower Unilateral Right  Result Date: 08/08/2019 CLINICAL DATA:  Right lower extremity pain and edema for the past 2 weeks. Evaluate for DVT. EXAM: RIGHT LOWER EXTREMITY VENOUS DOPPLER ULTRASOUND TECHNIQUE: Gray-scale sonography with  graded compression, as well as color Doppler and duplex ultrasound were performed to evaluate the lower extremity deep venous systems from the level of the common femoral vein and including the common femoral, femoral, profunda femoral, popliteal and calf veins including the posterior tibial, peroneal and gastrocnemius veins when visible. The superficial great saphenous vein was also interrogated. Spectral Doppler was utilized to evaluate flow at rest and with distal augmentation maneuvers in the common femoral, femoral and popliteal veins. COMPARISON:  None. FINDINGS: Examination degraded due to patient body habitus and poor sonographic window. Contralateral Common Femoral Vein: Respiratory phasicity is normal and symmetric with the symptomatic side. No evidence of thrombus. Normal compressibility. Common Femoral Vein: No evidence of thrombus. Normal compressibility, respiratory phasicity and response to augmentation. Saphenofemoral Junction: No evidence of thrombus. Normal compressibility and flow on color Doppler imaging. Profunda Femoral Vein: No evidence of thrombus. Normal compressibility and flow on color Doppler imaging. Femoral Vein: No evidence of thrombus. Normal compressibility, respiratory phasicity and response to augmentation. Popliteal Vein: No evidence of thrombus. Normal compressibility, respiratory phasicity and response to augmentation. Calf Veins: Appear patent where visualized. Superficial Great Saphenous Vein: No evidence of thrombus. Normal compressibility. Venous Reflux:  None. Other Findings:  None. IMPRESSION: No evidence of DVT within the right lower extremity. Electronically Signed   By: Simonne Come M.D.   On: 08/08/2019 16:46   DG Chest Port 1 View  Result Date: 08/10/2019 CLINICAL DATA:  Patient admitted for COVID-19 2 weeks ago.  Hypoxia. EXAM: PORTABLE CHEST 1 VIEW COMPARISON:  August 08, 2019 FINDINGS: Stable cardiomegaly. The hila and mediastinum are normal. Opacity persists in  the left base with a probable associated small effusion. Probable pulmonary venous congestion without overt edema. Possible tiny right effusion. Stable pacemaker. IMPRESSION: No interval changes in the small pleural effusions, left greater than right, and left basilar opacity. Probable pulmonary venous congestion. Electronically Signed   By: Gerome Sam III M.D   On: 08/10/2019 08:47   DG Knee Right Port  Result Date: 08/08/2019 CLINICAL DATA:  Right knee pain for 4 weeks EXAM: PORTABLE RIGHT KNEE - 1-2 VIEW COMPARISON:  None. FINDINGS: No acute fracture or malalignment. Mild-moderate medial compartment osteoarthritis. No appreciable knee joint effusion. Bones appear mildly demineralized. No focal soft tissue abnormality. IMPRESSION: No acute osseous abnormality of the right knee. Electronically Signed   By: Duanne Guess D.O.   On: 08/08/2019 16:07   ECHOCARDIOGRAM COMPLETE  Result Date: 08/11/2019    ECHOCARDIOGRAM REPORT   Patient Name:   Jennifer KailDOROTHY Gregory Date of Exam: 08/11/2019 Medical Rec #:  [id number]     Height:       64.0 in Accession #:    [id number]    Weight:       307.8 lb Date of Birth:  03/07/1946     BSA:          2.350 m Patient Age:    73 years      BP:           110/53 mmHg Patient Gender: F             HR:           74 bpm. Exam Location:  [facility] Procedure: 2D Echo, Color Doppler and Cardiac Doppler Indications:     I50.31 CHF-Acute Diastolic  History:         Patient has no prior history of Echocardiogram examinations.                  CHF, Arrythmias:Atrial Fibrillation; Risk Factors:Hypertension                  and Diabetes. Pt tested positive for COVID-19 on 08/08/19.  Sonographer:     Humphrey RollsJoan Heiss RDCS (AE) Referring Phys:  [id number] Alford HighlandICHARD WIETING Diagnosing Phys: Lorine BearsMuhammad Arida MD  Sonographer Comments: Suboptimal apical window and no subcostal window. Image acquisition challenging due to patient body habitus. IMPRESSIONS  1. Left ventricular ejection fraction, by estimation, is 50 to  55%. The left ventricle has low normal function. Left ventricular endocardial border not optimally defined to evaluate regional wall motion. There is mild left ventricular hypertrophy. Left ventricular diastolic parameters are indeterminate.  2. Right ventricular systolic function is normal. The right ventricular size is normal. There is mildly elevated pulmonary artery systolic pressure.  3. Left atrial size was mild to moderately dilated.  4. The mitral valve is normal in structure. Moderate mitral valve regurgitation. No evidence of mitral stenosis.  5. The aortic valve is normal in structure. Aortic valve regurgitation is not visualized. Mild to moderate aortic valve sclerosis/calcification is present, without any evidence of aortic stenosis.  6. The inferior vena cava is dilated in size with >50% respiratory variability, suggesting right atrial pressure of 8 mmHg. FINDINGS  Left Ventricle: Left ventricular ejection fraction, by estimation, is 50 to 55%. The left ventricle has low normal function. Left ventricular endocardial border not optimally defined to evaluate regional wall motion. The left ventricular internal cavity  size was normal in size. There is mild left ventricular hypertrophy. Left ventricular diastolic parameters are indeterminate. Right Ventricle: The right ventricular size is normal. No increase in right ventricular wall thickness. Right ventricular systolic function is normal. There is mildly elevated pulmonary artery systolic pressure. The tricuspid regurgitant velocity is 2.35  m/s, and with an assumed right atrial pressure of 10 mmHg, the estimated right ventricular systolic pressure is 32.1 mmHg. Left Atrium: Left atrial size was mild to moderately dilated. Right Atrium: Right atrial size was normal in size. Pericardium: There is no evidence of pericardial effusion. Mitral Valve: The mitral valve is normal in structure. Normal mobility of the mitral valve leaflets. Moderate mitral annular  calcification. Moderate mitral valve regurgitation. No evidence of mitral valve stenosis. MV peak gradient, 5.5 mmHg. The mean mitral valve gradient is 3.0 mmHg. Tricuspid Valve: The tricuspid valve is normal in structure. Tricuspid valve regurgitation is trivial. No evidence of  tricuspid stenosis. Aortic Valve: The aortic valve is normal in structure. Aortic valve regurgitation is not visualized. Mild to moderate aortic valve sclerosis/calcification is present, without any evidence of aortic stenosis. Aortic valve mean gradient measures 3.0 mmHg. Aortic valve peak gradient measures 6.8 mmHg. Aortic valve area, by VTI measures 2.86 cm. Pulmonic Valve: The pulmonic valve was normal in structure. Pulmonic valve regurgitation is mild. No evidence of pulmonic stenosis. Aorta: The aortic root is normal in size and structure. Venous: The inferior vena cava is dilated in size with greater than 50% respiratory variability, suggesting right atrial pressure of 8 mmHg. IAS/Shunts: No atrial level shunt detected by color flow Doppler.  LEFT VENTRICLE PLAX 2D LVIDd:         4.95 cm  Diastology LVIDs:         3.44 cm  LV e' lateral:   10.70 cm/s LV PW:         1.14 cm  LV E/e' lateral: 10.1 LV IVS:        0.78 cm  LV e' medial:    6.42 cm/s LVOT diam:     2.20 cm  LV E/e' medial:  16.9 LV SV:         62 LV SV Index:   27 LVOT Area:     3.80 cm  LEFT ATRIUM         Index LA diam:    5.50 cm 2.34 cm/m  AORTIC VALVE                   PULMONIC VALVE AV Area (Vmax):    2.24 cm    PV Vmax:       0.94 m/s AV Area (Vmean):   2.59 cm    PV Vmean:      67.900 cm/s AV Area (VTI):     2.86 cm    PV VTI:        0.159 m AV Vmax:           130.00 cm/s PV Peak grad:  3.5 mmHg AV Vmean:          85.300 cm/s PV Mean grad:  2.0 mmHg AV VTI:            0.218 m AV Peak Grad:      6.8 mmHg AV Mean Grad:      3.0 mmHg LVOT Vmax:         76.60 cm/s LVOT Vmean:        58.200 cm/s LVOT VTI:          0.164 m LVOT/AV VTI ratio: 0.75  AORTA Ao Root  diam: 3.40 cm MITRAL VALVE                TRICUSPID VALVE MV Area (PHT): 3.45 cm     TR Peak grad:   22.1 mmHg MV Peak grad:  5.5 mmHg     TR Vmax:        235.00 cm/s MV Mean grad:  3.0 mmHg MV Vmax:       1.17 m/s     SHUNTS MV Vmean:      76.0 cm/s    Systemic VTI:  0.16 m MV Decel Time: 220 msec     Systemic Diam: 2.20 cm MV E velocity: 108.33 cm/s Lorine Bears MD Electronically signed by Lorine Bears MD Signature Date/Time: 08/11/2019/10:12:35 AM    Final     Subjective: Patient has no new complaints.  She was refusing to  take meds from nursing staff and wants to take it according to her home schedule.  Discharge Exam: Vitals:   08/12/19 1100 08/12/19 1319  BP:    Pulse: 83 95  Resp: 19 19  Temp:    SpO2: 94% 96%   Vitals:   08/12/19 0730 08/12/19 0800 08/12/19 1100 08/12/19 1319  BP: 118/63 120/64    Pulse: 70 82 83 95  Resp: 20 19 19 19   Temp: 97.8 F (36.6 C)     TempSrc: Axillary     SpO2: 94% 94% 94% 96%  Weight:      Height:        General: Pt is alert, awake, morbidly obese, not in acute distress Cardiovascular: RRR, S1/S2 +, no rubs, no gallops Respiratory: CTA bilaterally, no wheezing, no rhonchi Abdominal: Soft, NT, ND, bowel sounds + Extremities: Trace LE edema with signs of chronic venous dermatitis, no cyanosis, pulses intact and symmetrical.   The results of significant diagnostics from this hospitalization (including imaging, microbiology, ancillary and laboratory) are listed below for reference.    Microbiology: Recent Results (from the past 240 hour(s))  Blood culture (routine x 2)     Status: None (Preliminary result)   Collection Time: 08/08/19  3:37 PM   Specimen: BLOOD  Result Value Ref Range Status   Specimen Description BLOOD BLOOD RIGHT HAND  Final   Special Requests   Final    BOTTLES DRAWN AEROBIC AND ANAEROBIC Blood Culture adequate volume   Culture   Final    NO GROWTH 4 DAYS Performed at [facility], [address]  [place], [place]    Report Status PENDING  Incomplete  Blood culture (routine x 2)     Status: None (Preliminary result)   Collection Time: 08/08/19  5:22 PM   Specimen: BLOOD  Result Value Ref Range Status   Specimen Description BLOOD BLOOD RIGHT HAND  Final   Special Requests   Final    BOTTLES DRAWN AEROBIC AND ANAEROBIC Blood Culture adequate volume   Culture   Final    NO GROWTH 4 DAYS Performed at [facility], [address], [place], [place]    Report Status PENDING  Incomplete  Respiratory Panel by RT PCR (Flu A&B, Covid) - Nasopharyngeal Swab     Status: Abnormal   Collection Time: 08/08/19  6:01 PM   Specimen: Nasopharyngeal Swab  Result Value Ref Range Status   SARS Coronavirus 2 by RT PCR POSITIVE (A) NEGATIVE Final    Comment: RESULT CALLED TO, READ BACK BY AND VERIFIED WITH: KATE BUCKNUM 08/08/19 @ 1916  MLK (NOTE) SARS-CoV-2 target nucleic acids are DETECTED. SARS-CoV-2 RNA is generally detectable in upper respiratory specimens  during the acute phase of infection. Positive results are indicative of the presence of the identified virus, but do not rule out bacterial infection or co-infection with other pathogens not detected by the test. Clinical correlation with patient history and other diagnostic information is necessary to determine patient infection status. The expected result is Negative. Fact Sheet for Patients:  08/10/19 Fact Sheet for Healthcare Providers: https://www.moore.com/ This test is not yet approved or cleared by the https://www.young.biz/ FDA and  has been authorized for detection and/or diagnosis of SARS-CoV-2 by FDA under an Emergency Use Authorization (EUA).  This EUA will remain in effect (meaning this test can be used) for  the duration of  the COVID-19 declaration under Section 564(b)(1) of the Act, 21 U.S.C. section 360bbb-3(b)(1), unless  the authorization is terminated  or revoked sooner.    Influenza A by PCR NEGATIVE NEGATIVE Final   Influenza B by PCR NEGATIVE NEGATIVE Final    Comment: (NOTE) The Xpert Xpress SARS-CoV-2/FLU/RSV assay is intended as an aid in  the diagnosis of influenza from Nasopharyngeal swab specimens and  should not be used as a sole basis for treatment. Nasal washings and  aspirates are unacceptable for Xpert Xpress SARS-CoV-2/FLU/RSV  testing. Fact Sheet for Patients: https://www.moore.com/ Fact Sheet for Healthcare Providers: https://www.young.biz/ This test is not yet approved or cleared by the [place] FDA and  has been authorized for detection and/or diagnosis of SARS-CoV-2 by  FDA under an Emergency Use Authorization (EUA). This EUA will remain  in effect (meaning this test can be used) for the duration of the  Covid-19 declaration under Section 564(b)(1) of the Act, 21  U.S.C. section 360bbb-3(b)(1), unless the authorization is  terminated or revoked. Performed at [facility], [address] [place], [place] [address]      Labs: BNP (last 3 results) Recent Labs    08/08/19 1413  BNP 168.0*   Basic Metabolic Panel: Recent Labs  Lab 08/08/19 1538 08/09/19 0731 08/10/19 0548 08/11/19 0444 08/12/19 0458  NA 136 138 138 140 139  K 4.8 4.7 4.2 4.7 4.5  CL 100 100 98 98 97*  CO2 25 28 32 33* 33*  GLUCOSE 192* 258* 253* 314* 266*  BUN 29* 26*  CREATININE 0.89 0.89 0.84 0.99 0.80  CALCIUM 8.2* 8.3* 8.3* 8.1* 8.2*  MG  --  2.1 1.9 2.0 2.1  PHOS  --  4.0 5.3* 4.6 3.8   Liver Function Tests: Recent Labs  Lab 08/08/19 1538 08/09/19 0731 08/10/19 0548 08/11/19 0444 08/12/19 0458  AST 36 30 28 32 40  ALT ALKPHOS 75 78 67 66 69  BILITOT 1.8* 0.9 1.0 0.9 0.8  PROT 7.3 6.7 7.1 6.7 6.8  ALBUMIN 2.3* 1.9* 2.1* 1.9* 2.1*   No results for input(s): LIPASE, AMYLASE in the last 168 hours. No results for input(s): AMMONIA in the  last 168 hours. CBC: Recent Labs  Lab 08/08/19 1413 08/09/19 0731 08/10/19 0548 08/11/19 0444 08/12/19 0458  WBC 14.2* 12.0* 10.7* 9.5 8.6  NEUTROABS 11.5* 10.4* 9.3* 8.0* 7.2  HGB 12.9 12.4 13.1 13.1 13.2  HCT 42.0 40.7 42.4 42.1 42.5  MCV 86.8 87.5 86.0 86.1 86.4  PLT 297 302 312 332 329   Cardiac Enzymes: No results for input(s): CKTOTAL, CKMB, CKMBINDEX, TROPONINI in the last 168 hours. BNP: Invalid input(s): POCBNP CBG: Recent Labs  Lab 08/11/19 1143 08/11/19 1642 08/11/19 2049 08/12/19 0758 08/12/19 1217  GLUCAP 206* 128* 193* 262* 174*   D-Dimer No results for input(s): DDIMER in the last 72 hours. Hgb A1c No results for input(s): HGBA1C in the last 72 hours. Lipid Profile No results for input(s): CHOL, HDL, LDLCALC, TRIG, CHOLHDL, LDLDIRECT in the last 72 hours. Thyroid function studies No results for input(s): TSH, T4TOTAL, T3FREE, THYROIDAB in the last 72 hours.  Invalid input(s): FREET3 Anemia work up Recent Labs    08/11/19 0444 08/12/19 0458  FERRITIN 177 167   Urinalysis    Component Value Date/Time   COLORURINE Yellow 12/26/2011 2338   APPEARANCEUR Clear 12/26/2011 2338   LABSPEC 1.020 12/26/2011 2338   PHURINE 5.0 12/26/2011 2338   GLUCOSEU Negative 12/26/2011 2338   HGBUR Negative 12/26/2011 2338   BILIRUBINUR Negative 12/26/2011 2338  KETONESUR Negative 12/26/2011 2338   PROTEINUR 30 mg/dL 16/02/9603 5409   NITRITE Negative 12/26/2011 2338   LEUKOCYTESUR Negative 12/26/2011 2338   Sepsis Labs Invalid input(s): PROCALCITONIN,  WBC,  LACTICIDVEN Microbiology Recent Results (from the past 240 hour(s))  Blood culture (routine x 2)     Status: None (Preliminary result)   Collection Time: 08/08/19  3:37 PM   Specimen: BLOOD  Result Value Ref Range Status   Specimen Description BLOOD BLOOD RIGHT HAND  Final   Special Requests   Final    BOTTLES DRAWN AEROBIC AND ANAEROBIC Blood Culture adequate volume   Culture   Final    NO GROWTH  4 DAYS Performed at [facility], [address] [place], [place] [address]    Report Status PENDING  Incomplete  Blood culture (routine x 2)     Status: None (Preliminary result)   Collection Time: 08/08/19  5:22 PM   Specimen: BLOOD  Result Value Ref Range Status   Specimen Description BLOOD BLOOD RIGHT HAND  Final   Special Requests   Final    BOTTLES DRAWN AEROBIC AND ANAEROBIC Blood Culture adequate volume   Culture   Final    NO GROWTH 4 DAYS Performed at [facility], [address], [place], [place] [address]    Report Status PENDING  Incomplete  Respiratory Panel by RT PCR (Flu A&B, Covid) - Nasopharyngeal Swab     Status: Abnormal   Collection Time: 08/08/19  6:01 PM   Specimen: Nasopharyngeal Swab  Result Value Ref Range Status   SARS Coronavirus 2 by RT PCR POSITIVE (A) NEGATIVE Final    Comment: RESULT CALLED TO, READ BACK BY AND VERIFIED WITH: KATE BUCKNUM 08/08/19 @ 1916  MLK (NOTE) SARS-CoV-2 target nucleic acids are DETECTED. SARS-CoV-2 RNA is generally detectable in upper respiratory specimens  during the acute phase of infection. Positive results are indicative of the presence of the identified virus, but do not rule out bacterial infection or co-infection with other pathogens not detected by the test. Clinical correlation with patient history and other diagnostic information is necessary to determine patient infection status. The expected result is Negative. Fact Sheet for Patients:  https://www.moore.com/ Fact Sheet for Healthcare Providers: https://www.young.biz/ This test is not yet approved or cleared by the [place] FDA and  has been authorized for detection and/or diagnosis of SARS-CoV-2 by FDA under an Emergency Use Authorization (EUA).  This EUA will remain in effect (meaning this test can be used) for  the duration of  the COVID-19 declaration under Section 564(b)(1) of the Act,  21 U.S.C. section 360bbb-3(b)(1), unless the authorization is terminated or revoked sooner.    Influenza A by PCR NEGATIVE NEGATIVE Final   Influenza B by PCR NEGATIVE NEGATIVE Final    Comment: (NOTE) The Xpert Xpress SARS-CoV-2/FLU/RSV assay is intended as an aid in  the diagnosis of influenza from Nasopharyngeal swab specimens and  should not be used as a sole basis for treatment. Nasal washings and  aspirates are unacceptable for Xpert Xpress SARS-CoV-2/FLU/RSV  testing. Fact Sheet for Patients: https://www.moore.com/ Fact Sheet for Healthcare Providers: https://www.young.biz/ This test is not yet approved or cleared by the [place] FDA and  has been authorized for detection and/or diagnosis of SARS-CoV-2 by  FDA under an Emergency Use Authorization (EUA). This EUA will remain  in effect (meaning this test can be used) for the duration of the  Covid-19 declaration under Section 564(b)(1) of the Act, 21  U.S.C.  section 360bbb-3(b)(1), unless the authorization is  terminated or revoked. Performed at [facility], [address] [place], [place] [address]     Time coordinating discharge: Over 30 minutes  SIGNED:  Lorella Nimrod, MD  [facility] 08/12/2019, 2:17 PM  If 7PM-7AM, please contact night-coverage [email or web address]  This record has been created using Systems analyst. Errors have been sought and corrected,but may not always be located. Such creation errors do not reflect on the standard of care.

## 2019-08-12 NOTE — TOC Transition Note (Signed)
Transition of Care [facility]) - CM/SW Discharge Note   Patient Details  Name: Nahomy Limburg MRN: [id number] Date of Birth: Nov 06, 1945  [facility]) CM/SW Contact:  Elliot Gault, LCSW Phone Number: 08/12/2019, 2:51 PM   Clinical Narrative:     Pt stable for dc and needing Home O2. Arranged with [employer] and Portable Concentrator has been delivered to pt's room.  Final next level of care: Home/Self Care Barriers to Discharge: Barriers Resolved   Patient Goals and CMS Choice        Discharge Placement                       Discharge Plan and Services                DME Arranged: Oxygen DME Agency: [employer] Date DME Agency Contacted: 08/12/19   Representative spoke with at DME Agency: Nida Boatman            Social Determinants of Health (SDOH) Interventions     Readmission Risk Interventions No flowsheet data found.

## 2019-08-12 NOTE — Progress Notes (Addendum)
SATURATION QUALIFICATIONS: (This note is used to comply with regulatory documentation for home oxygen)  Patient Saturations on Room Air at Rest = 88%  Patient Saturations on Room Air while Ambulating = 86%  Patient Saturations on 2 Liters of oxygen while Ambulating = 94%  Please briefly explain why patient needs home oxygen: Desats at rest and with prolonged activity.

## 2019-08-12 NOTE — Progress Notes (Signed)
Son called from hospital round about and is outside waiting to pick patient up. Patient leaving now with O2 and other belongings

## 2019-08-13 LAB — CULTURE, BLOOD (ROUTINE X 2)
Culture: NO GROWTH
Culture: NO GROWTH
Special Requests: ADEQUATE
Special Requests: ADEQUATE

## 2021-01-05 ENCOUNTER — Other Ambulatory Visit: Payer: Self-pay | Admitting: Family Medicine

## 2021-01-05 DIAGNOSIS — Z780 Asymptomatic menopausal state: Secondary | ICD-10-CM

## 2021-01-05 DIAGNOSIS — Z1231 Encounter for screening mammogram for malignant neoplasm of breast: Secondary | ICD-10-CM

## 2021-03-13 IMAGING — DX DG KNEE 1-2V PORT*R*
4 series · 4 of 4 positions shown · non-contrast
Comparison: None.

CLINICAL DATA: Right knee pain for 4 weeks

EXAM:
PORTABLE RIGHT KNEE - 1-2 VIEW

[knee obl]
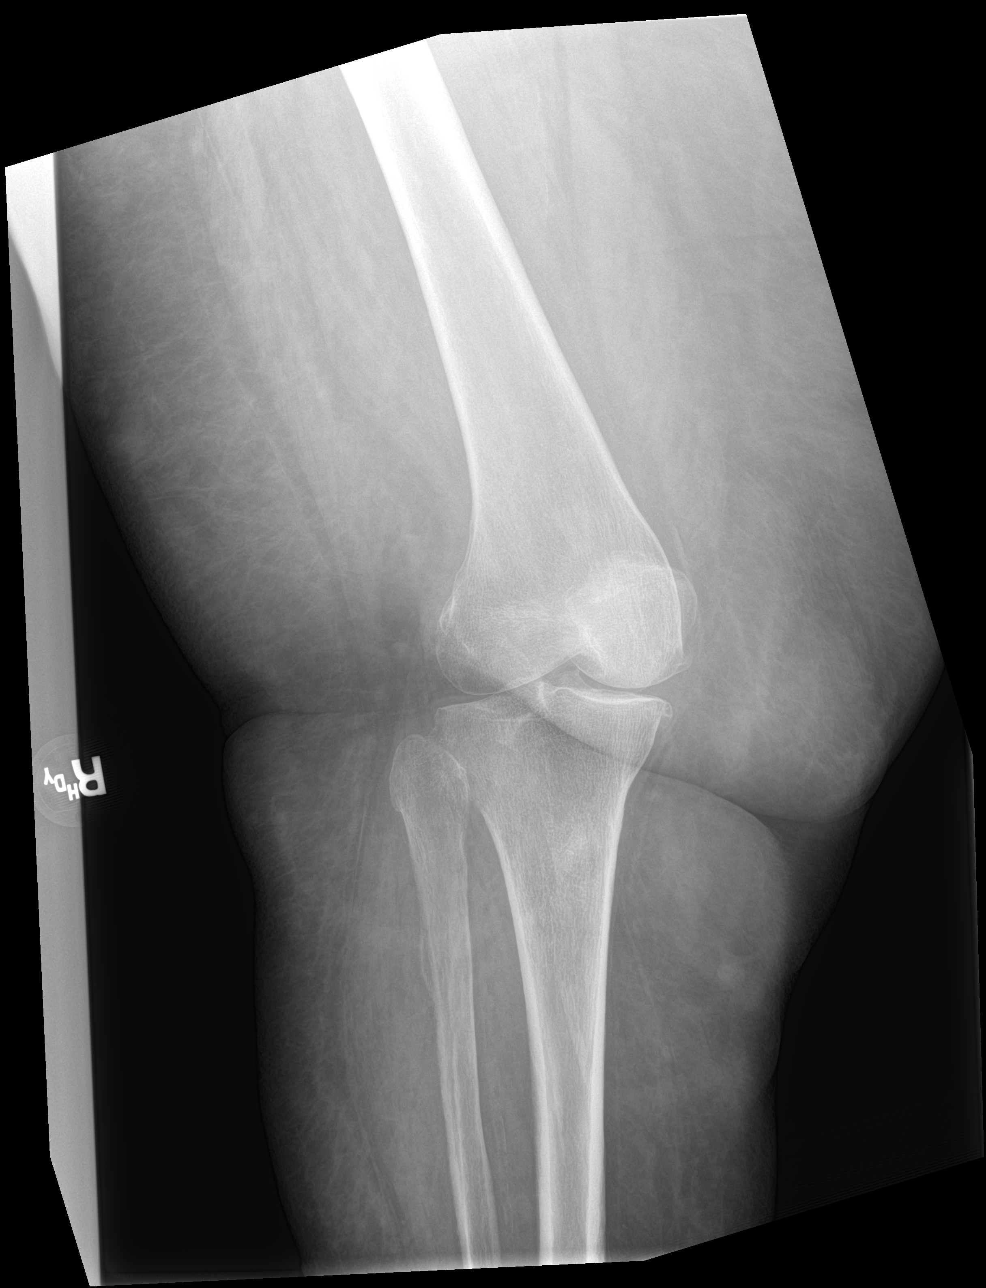

[knee lat (1 of 2)]
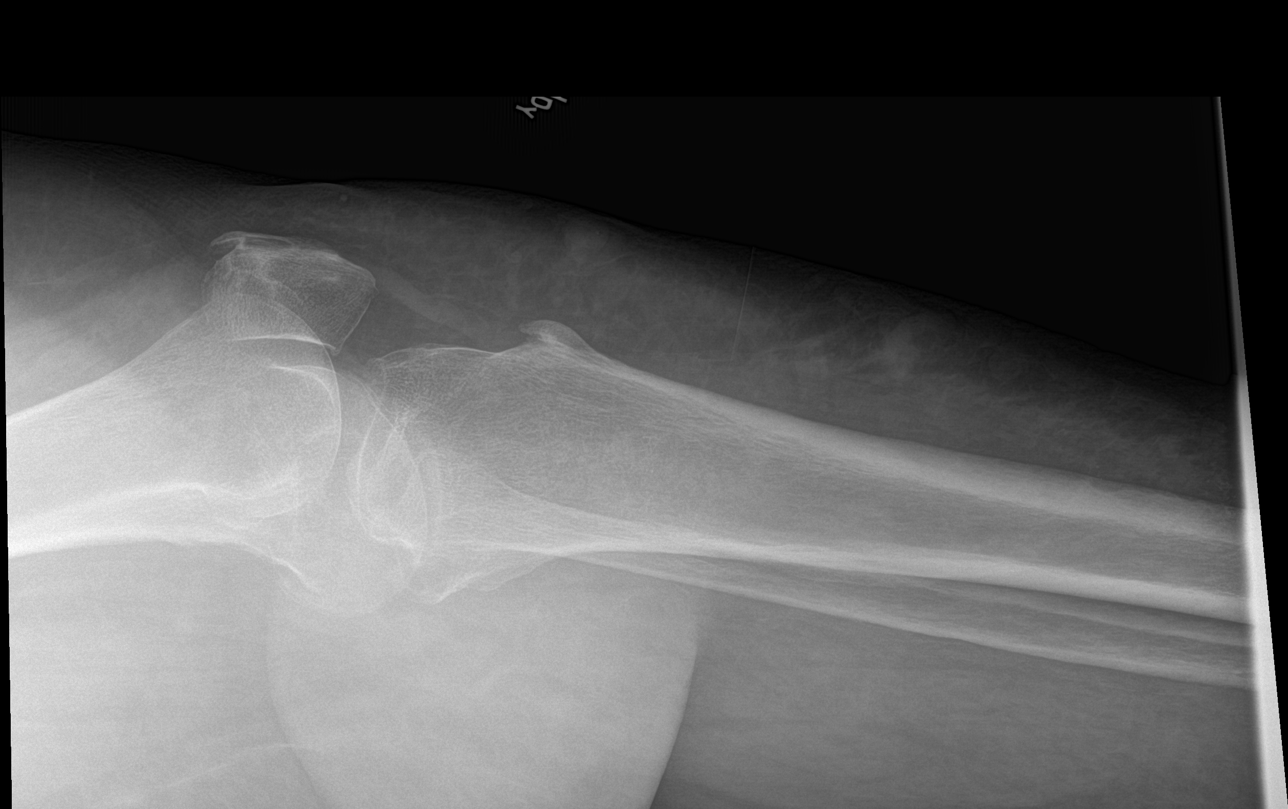

[knee ap]
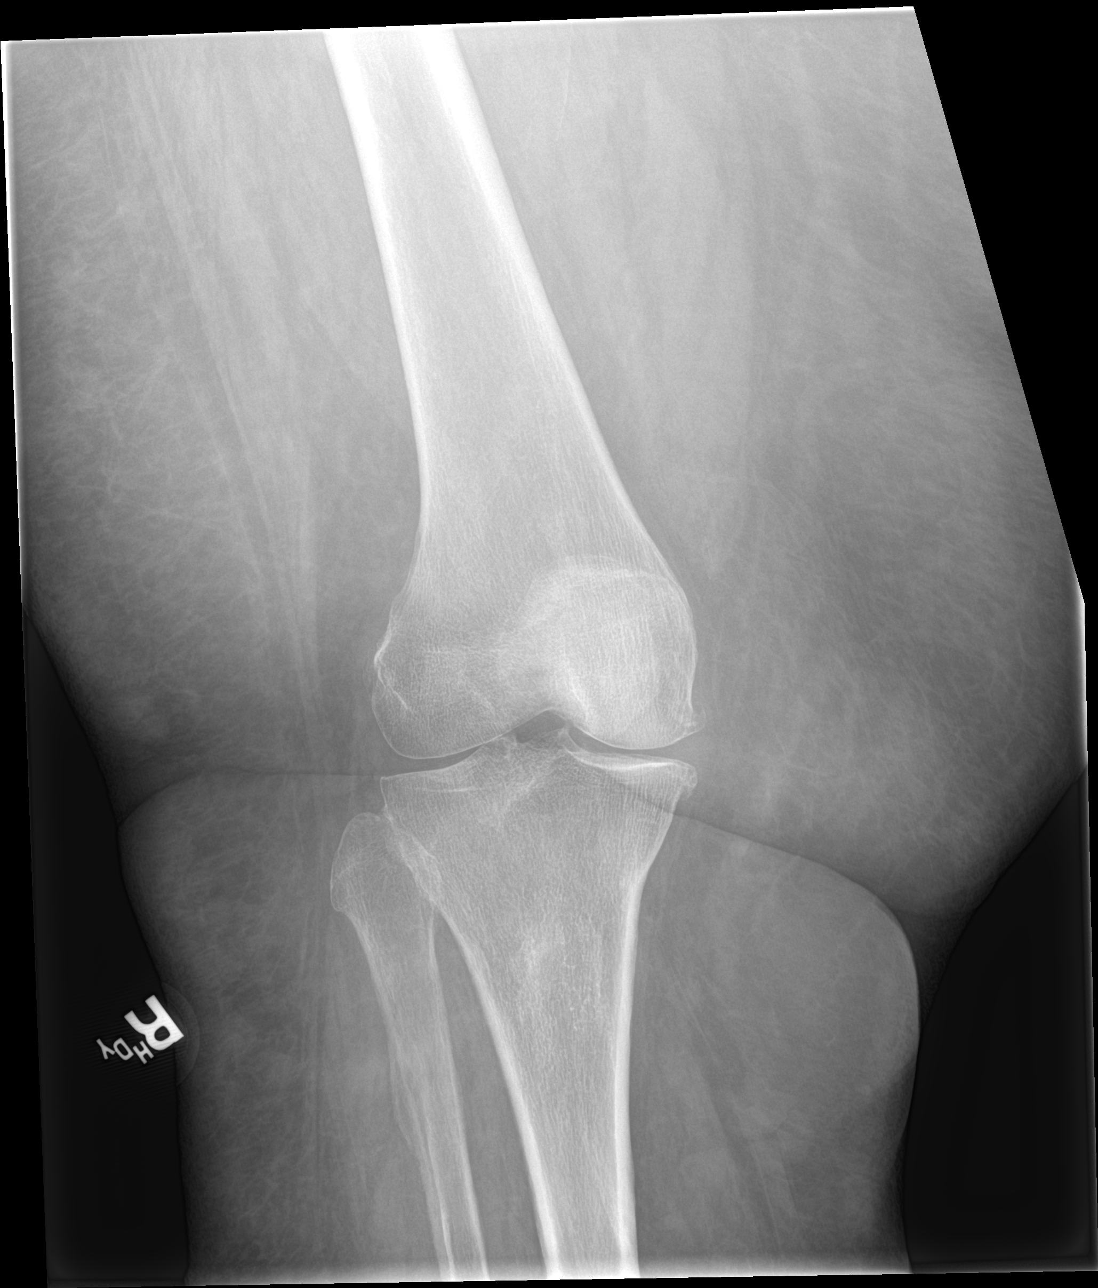

[knee lat (2 of 2)]
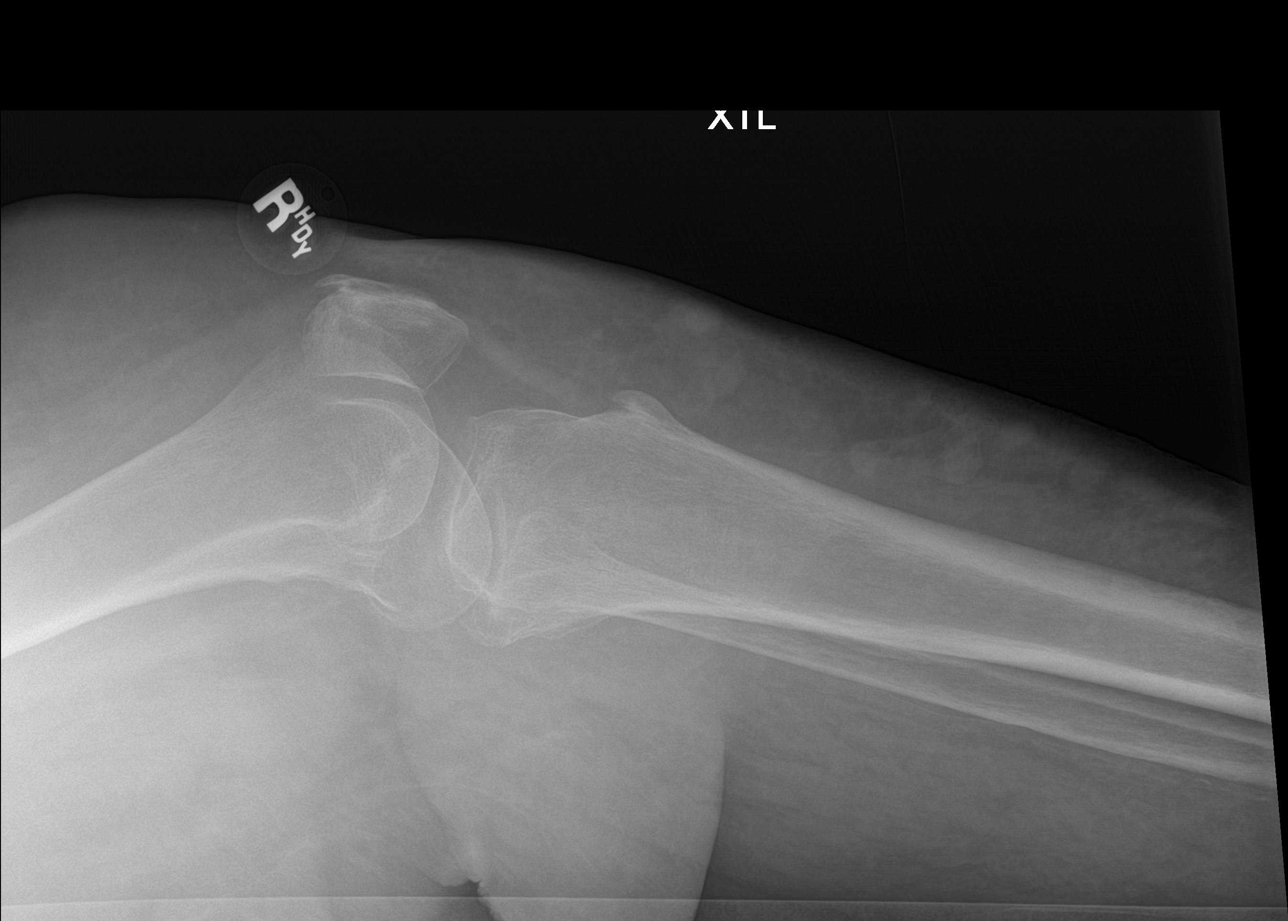

[4 of 4 positions shown; findings below may reference images not displayed]

FINDINGS: No acute fracture or malalignment. Mild-moderate medial compartment
osteoarthritis. No appreciable knee joint effusion. Bones appear
mildly demineralized. No focal soft tissue abnormality.
IMPRESSION: No acute osseous abnormality of the right knee.

## 2021-03-13 IMAGING — US US EXTREM LOW VENOUS*R*
1 series · 13 of 24 positions shown · non-contrast
Comparison: None.

CLINICAL DATA: Right lower extremity pain and edema for the past 2
weeks. Evaluate for DVT.



[Series 1: us extrem low venous*right* · 0.13mm/px · 13 of 35 slices shown]
[im 1/35]
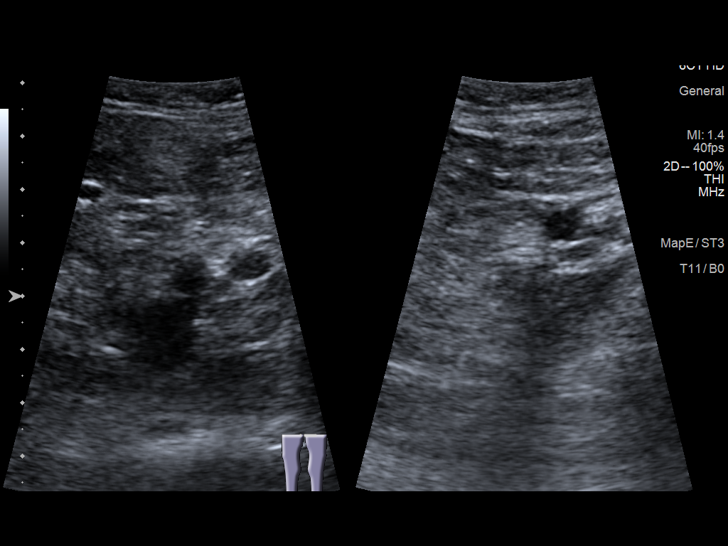
[im 3/35]
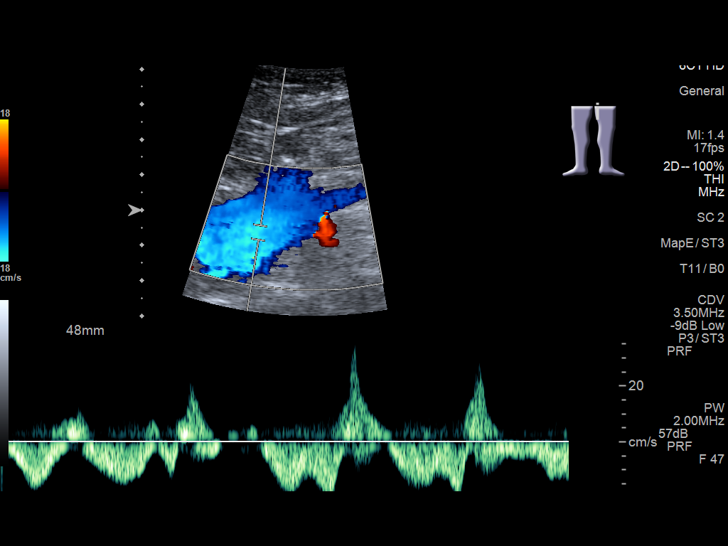
[im 6/35]
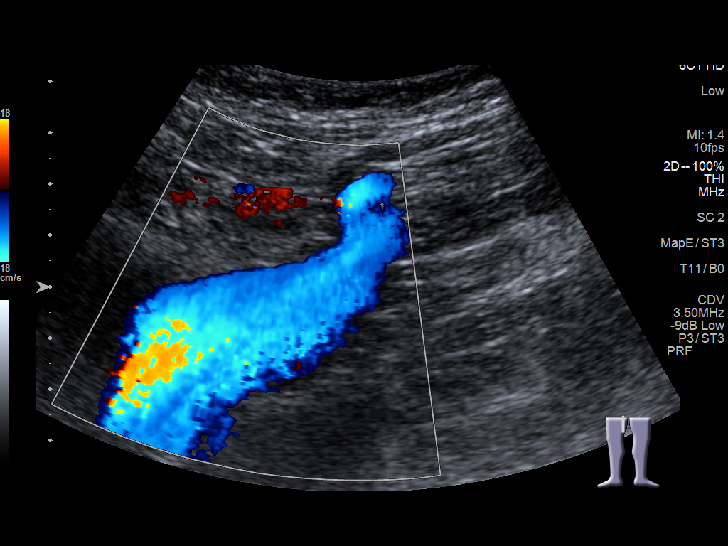
[im 9/35]
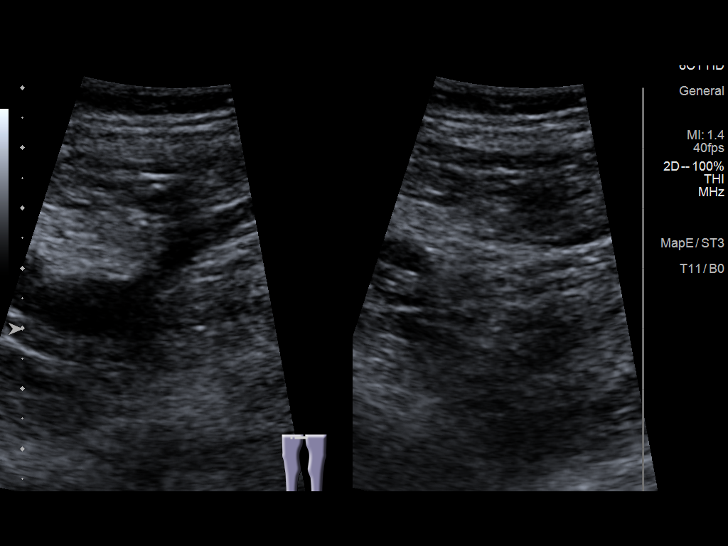
[im 12/35]
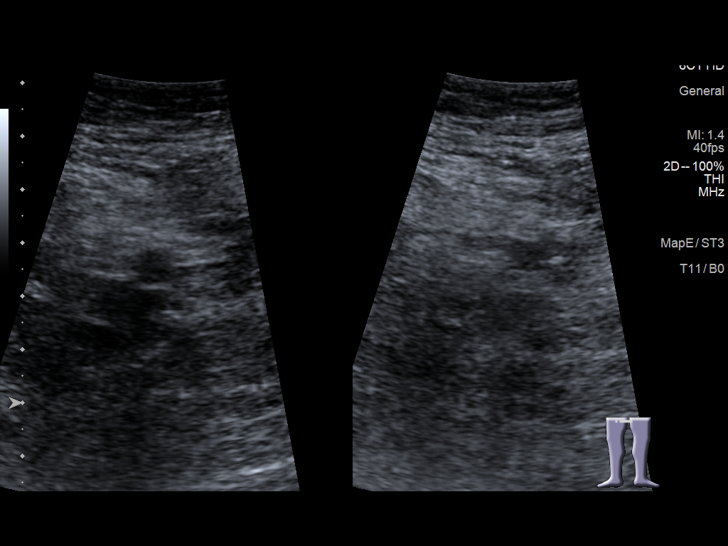
[im 15/35]
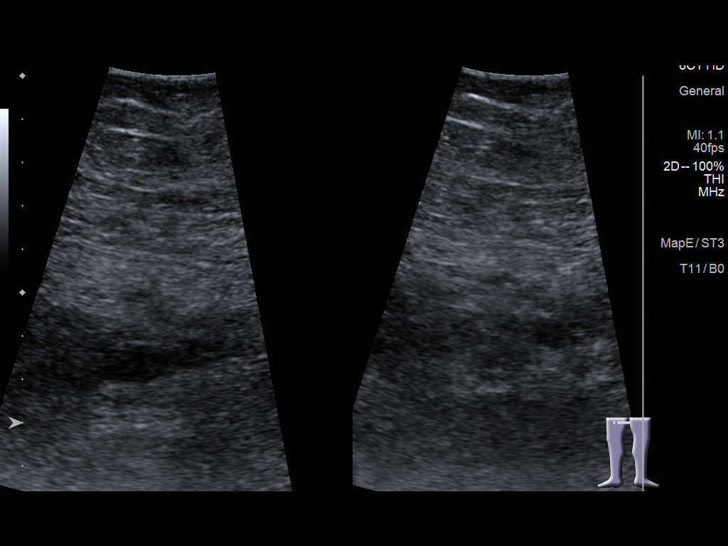
[im 18/35]
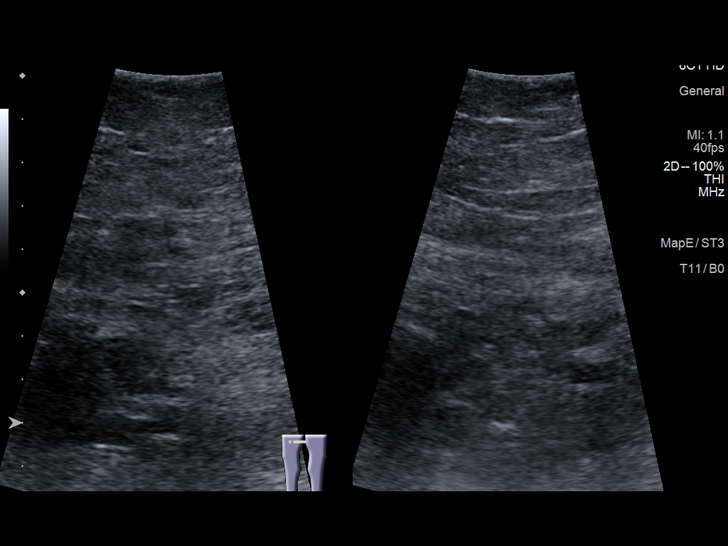
[im 20/35]
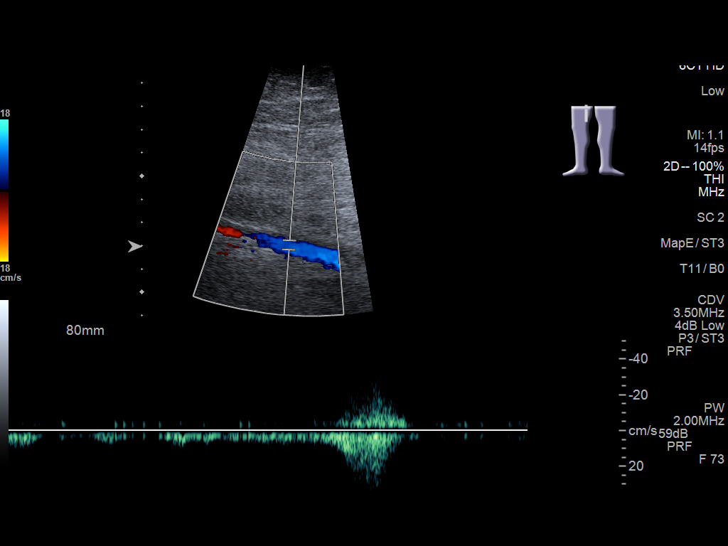
[im 23/35]
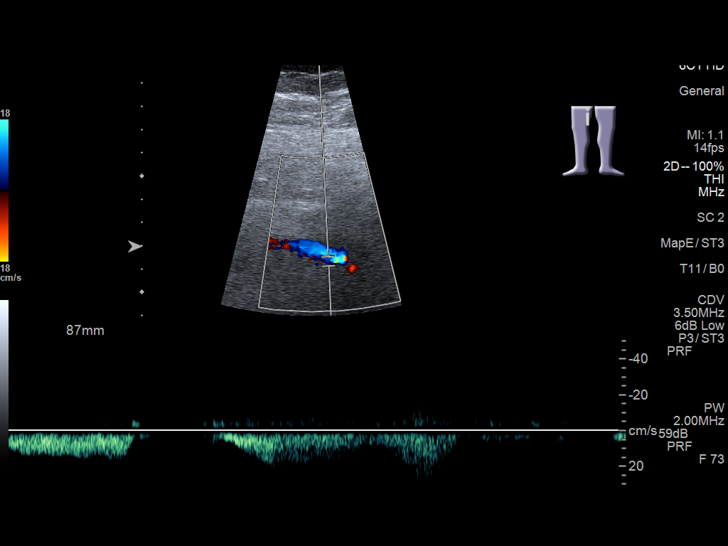
[im 26/35]
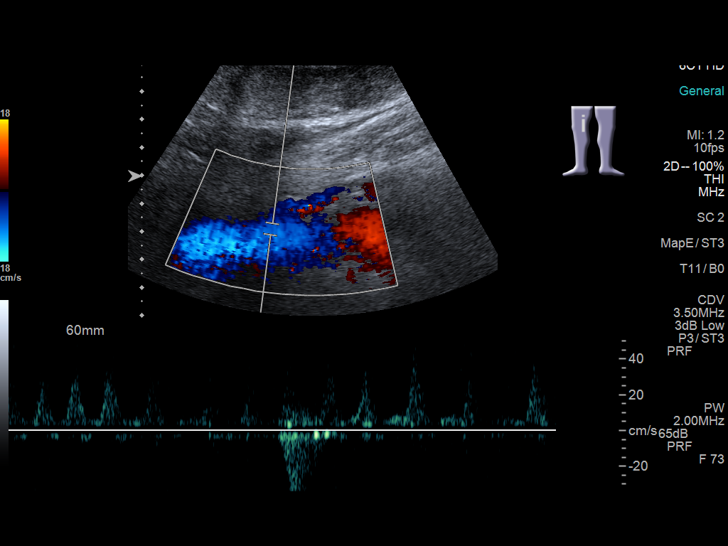
[im 29/35]
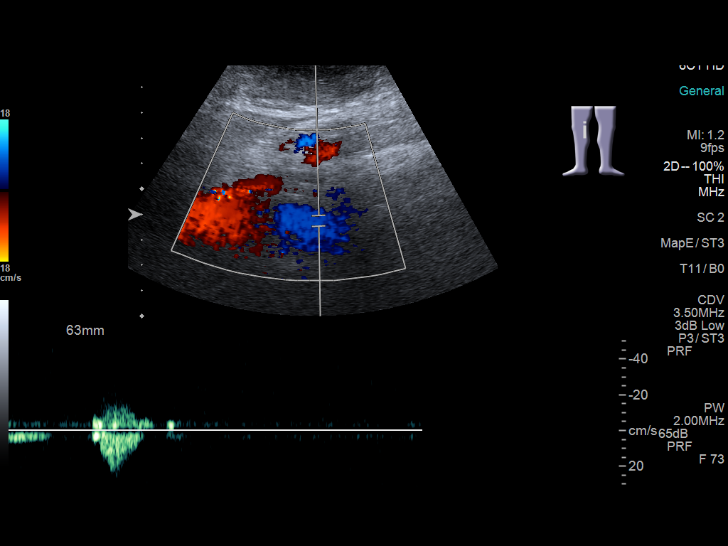
[im 32/35]
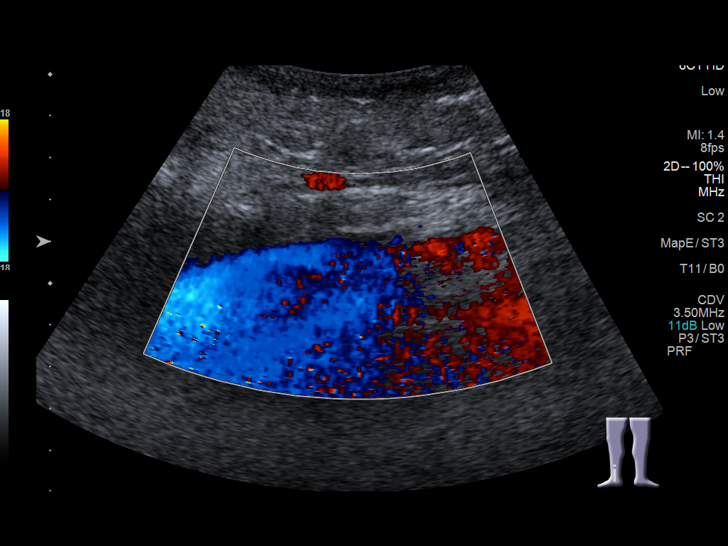
[im 35/35]
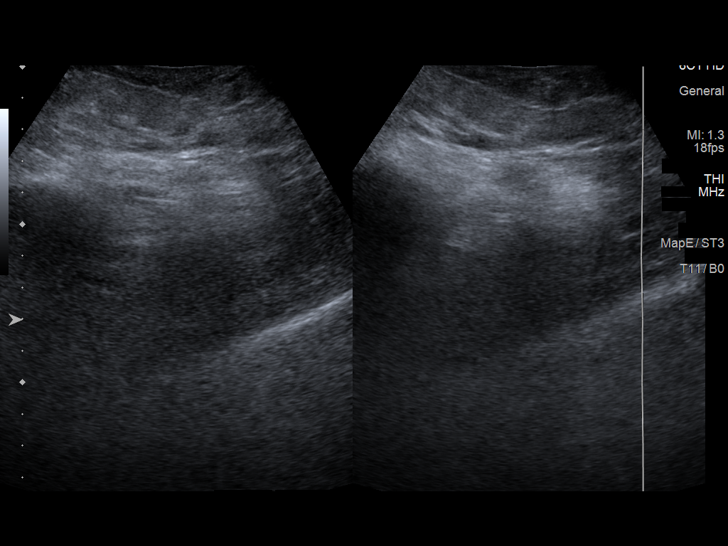

[13 of 24 positions shown; findings below may reference images not displayed]

FINDINGS: Examination degraded due to patient body habitus and poor
sonographic window.

Contralateral Common Femoral Vein: Respiratory phasicity is normal
and symmetric with the symptomatic side. No evidence of thrombus.
Normal compressibility.

Common Femoral Vein: No evidence of thrombus. Normal
compressibility, respiratory phasicity and response to augmentation.

Saphenofemoral Junction: No evidence of thrombus. Normal
compressibility and flow on color Doppler imaging.

Profunda Femoral Vein: No evidence of thrombus. Normal
compressibility and flow on color Doppler imaging.

Femoral Vein: No evidence of thrombus. Normal compressibility,
respiratory phasicity and response to augmentation.

Popliteal Vein: No evidence of thrombus. Normal compressibility,
respiratory phasicity and response to augmentation.

Calf Veins: Appear patent where visualized.

Superficial Great Saphenous Vein: No evidence of thrombus. Normal
compressibility.

Venous Reflux:  None.

Other Findings:  None.
IMPRESSION: No evidence of DVT within the right lower extremity.

## 2021-03-15 IMAGING — DX DG CHEST 1V PORT
1 series · 1 of 1 positions shown · non-contrast
Comparison: August 08, 2019

CLINICAL DATA: Patient admitted for 43DC4-2R 2 weeks ago.  Hypoxia.

EXAM:
PORTABLE CHEST 1 VIEW

[chest ap]
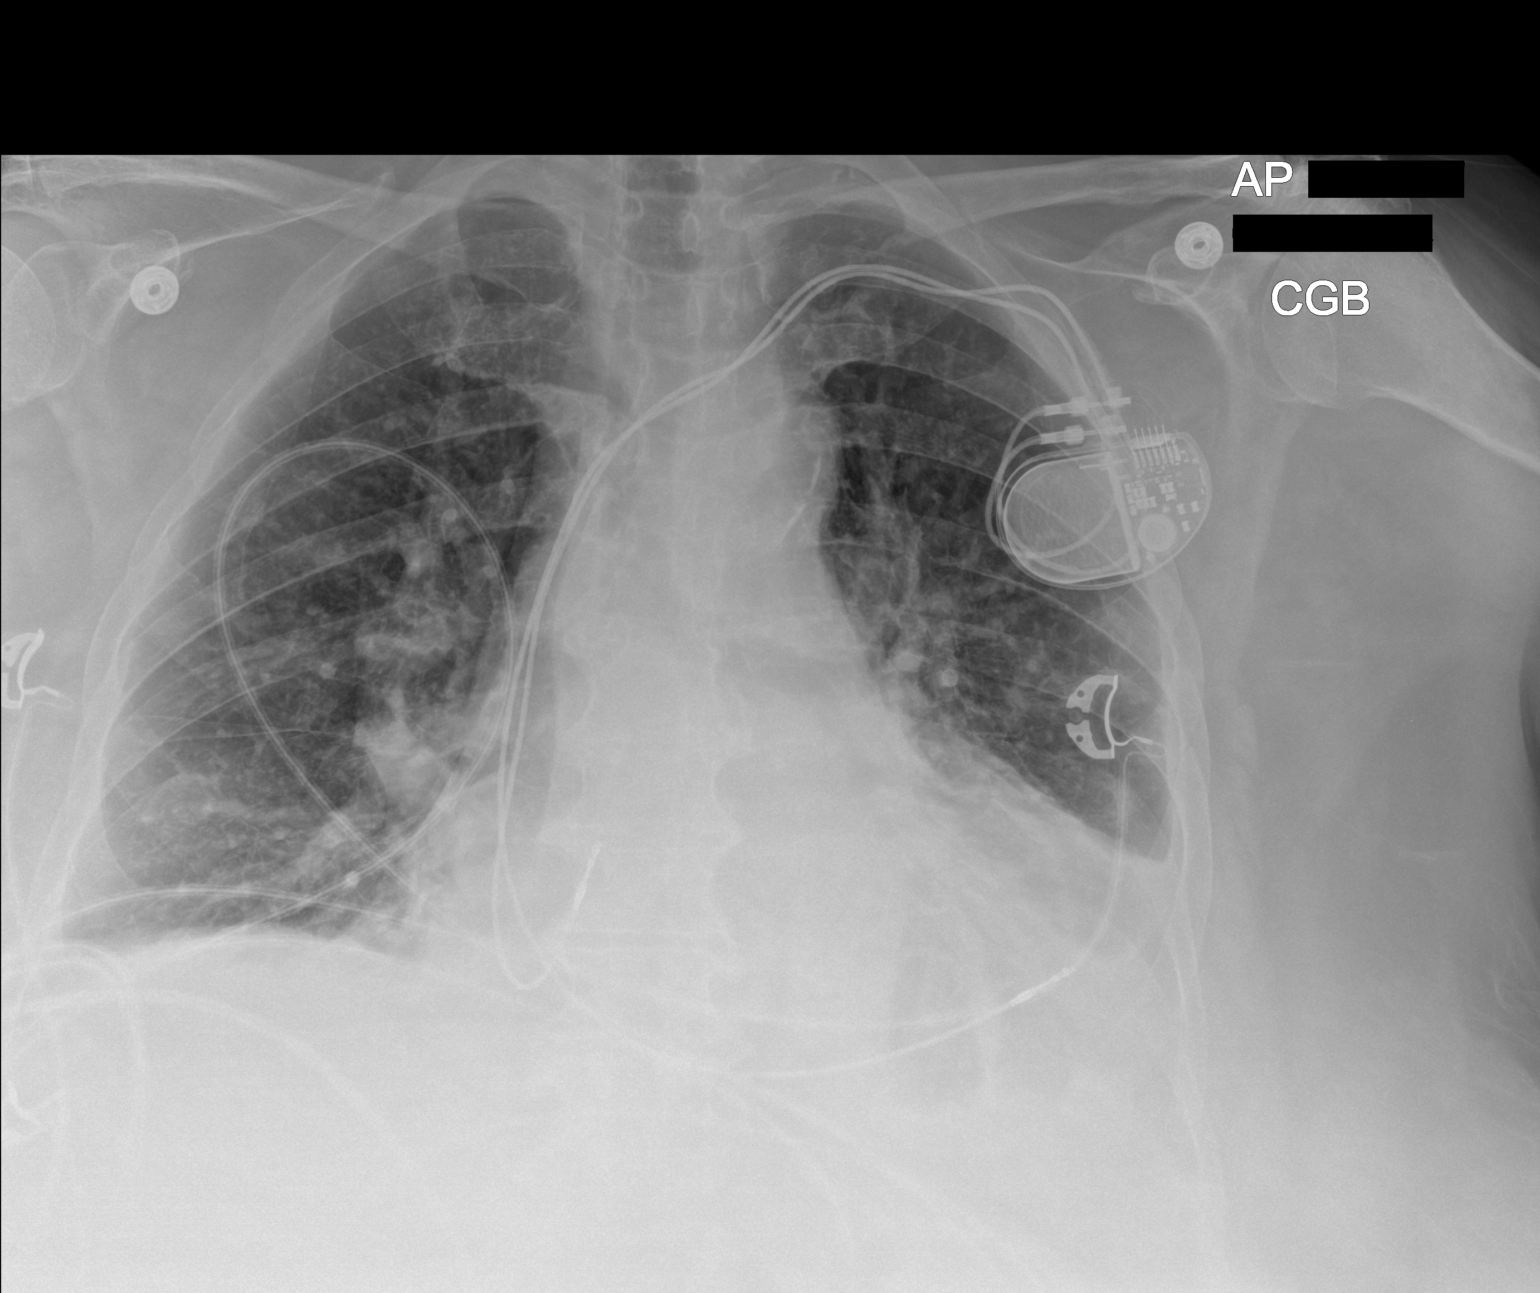

[1 of 1 positions shown; findings below may reference images not displayed]

FINDINGS: Stable cardiomegaly. The hila and mediastinum are normal. Opacity
persists in the left base with a probable associated small effusion.
Probable pulmonary venous congestion without overt edema. Possible
tiny right effusion. Stable pacemaker.
IMPRESSION: No interval changes in the small pleural effusions, left greater
than right, and left basilar opacity. Probable pulmonary venous
congestion.

## 2021-07-02 ENCOUNTER — Emergency Department: Payer: Medicare Other

## 2021-07-02 ENCOUNTER — Emergency Department
Admission: EM | Admit: 2021-07-02 | Discharge: 2021-07-02 | Disposition: A | Discharge status: Home / Self Care | Payer: Medicare Other | Attending: Emergency Medicine | Admitting: Emergency Medicine

## 2021-07-02 ENCOUNTER — Other Ambulatory Visit: Payer: Self-pay

## 2021-07-02 ENCOUNTER — Encounter: Payer: Self-pay | Admitting: Emergency Medicine

## 2021-07-02 DIAGNOSIS — S0990XA Unspecified injury of head, initial encounter: Secondary | ICD-10-CM | POA: Diagnosis not present

## 2021-07-02 DIAGNOSIS — I509 Heart failure, unspecified: Secondary | ICD-10-CM | POA: Diagnosis not present

## 2021-07-02 DIAGNOSIS — Z7901 Long term (current) use of anticoagulants: Secondary | ICD-10-CM | POA: Diagnosis not present

## 2021-07-02 DIAGNOSIS — Y93E1 Activity, personal bathing and showering: Secondary | ICD-10-CM | POA: Insufficient documentation

## 2021-07-02 DIAGNOSIS — I110 Hypertensive heart disease with heart failure: Secondary | ICD-10-CM | POA: Insufficient documentation

## 2021-07-02 DIAGNOSIS — S8011XA Contusion of right lower leg, initial encounter: Secondary | ICD-10-CM | POA: Insufficient documentation

## 2021-07-02 DIAGNOSIS — Y92009 Unspecified place in unspecified non-institutional (private) residence as the place of occurrence of the external cause: Secondary | ICD-10-CM | POA: Diagnosis not present

## 2021-07-02 DIAGNOSIS — W182XXA Fall in (into) shower or empty bathtub, initial encounter: Secondary | ICD-10-CM | POA: Insufficient documentation

## 2021-07-02 DIAGNOSIS — E119 Type 2 diabetes mellitus without complications: Secondary | ICD-10-CM | POA: Insufficient documentation

## 2021-07-02 DIAGNOSIS — W19XXXA Unspecified fall, initial encounter: Secondary | ICD-10-CM

## 2021-07-02 DIAGNOSIS — R0789 Other chest pain: Secondary | ICD-10-CM | POA: Diagnosis not present

## 2021-07-02 DIAGNOSIS — S8991XA Unspecified injury of right lower leg, initial encounter: Secondary | ICD-10-CM | POA: Diagnosis present

## 2021-07-02 NOTE — ED Provider Notes (Signed)
[facility] Provider Note    Event Date/Time   First MD Initiated Contact with Patient 07/02/21 1308     (approximate)   History   Chief Complaint Fall   HPI  Jennifer Gregory is a 76 y.o. female with past medical history of hypertension, diabetes, CHF, and atrial fibrillation on Xarelto who presents to the ED complaining of fall.  Patient reports that she used a shower in her home that she does not typically use and when she went to get out, tripped on the lip of the shower.  She states that she fell onto her right side, does not think she hit her head but complains of pain along her right chest wall and in her right leg.  She has noticed significant swelling and skin discoloration to her right shin, believes she has a hematoma.  She reports hitting her right shoulder but denies any pain there or in her right arm.  She denies any headache or neck pain.  She also denies any abdominal pain, left arm, or left leg pain.  She reports feeling fine prior to the fall.      Physical Exam   Triage Vital Signs: ED Triage Vitals  Enc Vitals Group     BP 07/02/21 1259 (!) 116/58     Pulse Rate 07/02/21 1259 66     Resp 07/02/21 1259 18     Temp 07/02/21 1259 97.8 F (36.6 C)     Temp Source 07/02/21 1259 Oral     SpO2 07/02/21 1259 96 %     Weight 07/02/21 1256 292 lb (132.5 kg)     Height 07/02/21 1256 5\' 4"  (1.626 m)     Head Circumference --      Peak Flow --      Pain Score 07/02/21 1256 5     Pain Loc --      Pain Edu? --      Excl. in GC? --     Most recent vital signs: Vitals:   07/02/21 1259  BP: (!) 116/58  Pulse: 66  Resp: 18  Temp: 97.8 F (36.6 C)  SpO2: 96%    Constitutional: Alert and oriented. Eyes: Conjunctivae are normal. Head: Atraumatic. Nose: No congestion/rhinnorhea. Mouth/Throat: Mucous membranes are moist.  Neck: No midline cervical spine tenderness to palpation. Cardiovascular: Normal rate, regular rhythm. Grossly normal  heart sounds.  2+ radial pulses bilaterally. Respiratory: Normal respiratory effort.  No retractions. Lungs CTAB.  Right chest wall tenderness to palpation noted. Gastrointestinal: Soft and nontender. No distention. Musculoskeletal: Hematoma noted to right mid shin with no lacerations or bleeding noted.  Tenderness to palpation over hematoma with no obvious deformities.  No tenderness to palpation in right ankle or knee, no tenderness noted to bilateral hips.  No tenderness noted to bilateral upper extremities. Neurologic:  Normal speech and language. No gross focal neurologic deficits are appreciated.    ED Results / Procedures / Treatments   Labs (all labs ordered are listed, but only abnormal results are displayed) Labs Reviewed - No data to display   RADIOLOGY Chest x-ray reviewed by me with no obvious rib fracture, focal infiltrate or effusion.  Right leg x-ray reviewed by me with no fracture or dislocation.  PROCEDURES:  Critical Care performed: No  Procedures   MEDICATIONS ORDERED IN ED: Medications - No data to display   IMPRESSION / MDM / ASSESSMENT AND PLAN / ED COURSE  I reviewed the triage vital signs and the  nursing notes.                              76 y.o. female with past medical history of hypertension, diabetes, CHF, and atrial fibrillation on Xarelto who presents to the ED complaining of right chest wall and right leg pain following a trip and fall getting out of her shower.  Differential diagnosis includes, but is not limited to, rib fracture, hemothorax, pneumothorax, tibial fracture, fibular fracture, intracranial injury, cervical spine injury.  Patient is nontoxic-appearing and in no acute distress, has reassuring vital signs and is neurovascular intact to her bilateral lower extremities.  She does have apparent hematoma to her right shin, we will further assess with x-ray for bony injury.  Also plan to check chest x-ray given her chest wall tenderness to  palpation.  Check CT head and cervical spine given her advanced age and anticoagulated status.  CT head and cervical spine are negative for acute process.  Chest x-ray shows no evidence of rib fracture, hemothorax, pneumothorax.  X-ray of right lower leg is consistent with hematoma but shows no signs of bony injury.  Patient is appropriate for discharge home with PCP follow-up, was counseled to return to the ED for new worsening symptoms.  Patient and family agree with plan.      FINAL CLINICAL IMPRESSION(S) / ED DIAGNOSES   Final diagnoses:  Fall, initial encounter  Hematoma of right lower extremity, initial encounter     Rx / DC Orders   ED Discharge Orders     None        Note:  This document was prepared using Dragon voice recognition software and may include unintentional dictation errors.   Chesley Noon, MD 07/02/21 [phone]

## 2021-07-02 NOTE — ED Triage Notes (Signed)
Pt to ER via EMS from home with c/o mechanical fall with c/o right chest pain that increases with cough.  Pt also states right lwoer leg pain with swelling and bruising.  Pt tripped on lip of shower causing fall.  Pt denies hitting head, denies LOC, dizziness.

## 2021-07-02 NOTE — ED Notes (Signed)
Pt. In rad ? ?

## 2021-10-28 ENCOUNTER — Ambulatory Visit
Admission: RE | Admit: 2021-10-28 | Discharge: 2021-10-28 | Disposition: A | Discharge status: Home / Self Care | Payer: Medicare Other | Attending: Family Medicine | Admitting: Family Medicine

## 2021-10-28 ENCOUNTER — Other Ambulatory Visit: Payer: Self-pay | Admitting: Family Medicine

## 2021-10-28 ENCOUNTER — Ambulatory Visit
Admission: RE | Admit: 2021-10-28 | Discharge: 2021-10-28 | Disposition: A | Discharge status: Home / Self Care | Payer: Medicare Other | Source: Ambulatory Visit | Attending: Family Medicine | Admitting: Family Medicine

## 2021-10-28 DIAGNOSIS — J189 Pneumonia, unspecified organism: Secondary | ICD-10-CM | POA: Diagnosis present

## 2022-01-16 ENCOUNTER — Other Ambulatory Visit: Payer: Self-pay

## 2022-01-16 ENCOUNTER — Emergency Department
Admission: EM | Admit: 2022-01-16 | Discharge: 2022-01-17 | Disposition: A | Discharge status: Home / Self Care | Payer: Medicare Other | Attending: Emergency Medicine | Admitting: Emergency Medicine

## 2022-01-16 DIAGNOSIS — I509 Heart failure, unspecified: Secondary | ICD-10-CM | POA: Diagnosis not present

## 2022-01-16 DIAGNOSIS — Z7984 Long term (current) use of oral hypoglycemic drugs: Secondary | ICD-10-CM | POA: Diagnosis not present

## 2022-01-16 DIAGNOSIS — Z950 Presence of cardiac pacemaker: Secondary | ICD-10-CM | POA: Insufficient documentation

## 2022-01-16 DIAGNOSIS — Z7901 Long term (current) use of anticoagulants: Secondary | ICD-10-CM | POA: Insufficient documentation

## 2022-01-16 DIAGNOSIS — I110 Hypertensive heart disease with heart failure: Secondary | ICD-10-CM | POA: Diagnosis not present

## 2022-01-16 DIAGNOSIS — I4891 Unspecified atrial fibrillation: Secondary | ICD-10-CM | POA: Diagnosis not present

## 2022-01-16 DIAGNOSIS — R079 Chest pain, unspecified: Secondary | ICD-10-CM | POA: Insufficient documentation

## 2022-01-16 DIAGNOSIS — E119 Type 2 diabetes mellitus without complications: Secondary | ICD-10-CM | POA: Insufficient documentation

## 2022-01-16 NOTE — ED Triage Notes (Signed)
Patient BIB EMS for evaluation of chest pain.  Reports she was "making some desert and I had just put it in the fridge.  I then started to have one of my heart episodes.  I took 2 nitros and the pain is now better."  No reports of chest pain at this time

## 2022-01-17 ENCOUNTER — Emergency Department: Payer: Medicare Other

## 2022-01-17 LAB — TROPONIN I (HIGH SENSITIVITY)
Troponin I (High Sensitivity): 6 ng/L (ref <18)
Troponin I (High Sensitivity): 7 ng/L (ref <18)

## 2022-01-17 LAB — CBC WITH DIFFERENTIAL/PLATELET
Abs Immature Granulocytes: 0.02 10*3/uL (ref 0.00–0.07)
Basophils Absolute: 0 10*3/uL (ref 0.0–0.1)
Basophils Relative: 1 %
Eosinophils Absolute: 0.1 10*3/uL (ref 0.0–0.5)
Eosinophils Relative: 1 %
HCT: 38.9 % (ref 36.0–46.0)
Hemoglobin: 11.3 g/dL — ABNORMAL LOW (ref 12.0–15.0)
Immature Granulocytes: 0 %
Lymphocytes Relative: 16 %
Lymphs Abs: 1.3 10*3/uL (ref 0.7–4.0)
MCH: 25.9 pg — ABNORMAL LOW (ref 26.0–34.0)
MCHC: 29 g/dL — ABNORMAL LOW (ref 30.0–36.0)
MCV: 89 fL (ref 80.0–100.0)
Monocytes Absolute: 0.8 10*3/uL (ref 0.1–1.0)
Monocytes Relative: 11 %
Neutro Abs: 5.5 10*3/uL (ref 1.7–7.7)
Neutrophils Relative %: 71 %
Platelets: 155 10*3/uL (ref 150–400)
RBC: 4.37 MIL/uL (ref 3.87–5.11)
RDW: 16.2 % — ABNORMAL HIGH (ref 11.5–15.5)
WBC: 7.6 10*3/uL (ref 4.0–10.5)
nRBC: 0 % (ref 0.0–0.2)

## 2022-01-17 LAB — BASIC METABOLIC PANEL
Anion gap: 8 (ref 5–15)
BUN: 25 mg/dL — ABNORMAL HIGH (ref 8–23)
CO2: 27 mmol/L (ref 22–32)
Calcium: 8.6 mg/dL — ABNORMAL LOW (ref 8.9–10.3)
Chloride: 103 mmol/L (ref 98–111)
Creatinine, Ser: 0.98 mg/dL (ref 0.44–1.00)
GFR, Estimated: 59 mL/min — ABNORMAL LOW (ref >60)
Glucose, Bld: 135 mg/dL — ABNORMAL HIGH (ref 70–99)
Potassium: 4.2 mmol/L (ref 3.5–5.1)
Sodium: 138 mmol/L (ref 135–145)

## 2022-01-17 LAB — BRAIN NATRIURETIC PEPTIDE: B Natriuretic Peptide: 217.4 pg/mL — ABNORMAL HIGH (ref 0.0–100.0)

## 2022-01-17 NOTE — ED Notes (Signed)
Multiple attempted made to interrogate [employer] pacemaker.  Unable to complete task at this time due to interrogator not working.  Multiple staff members attempted without success.  MD aware

## 2022-01-17 NOTE — Discharge Instructions (Signed)
Please follow-up with your cardiologist as scheduled tomorrow and on the fifth.

## 2022-01-17 NOTE — ED Provider Notes (Incomplete)
[facility] Provider Note    Event Date/Time   First MD Initiated Contact with Patient 01/16/22 2340     (approximate)   History   Chest Pain   HPI  Iona Stay is a 76 y.o. 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  States symptoms started around 10:20 PM.  She took a total of 2 nitroglycerin tablets at home and is now asymptomatic.  She states she felt very hot like she was burning inside.  She was very nauseated but never vomited.   History provided by patient and EMS.    Past Medical History:  Diagnosis Date  . Atrial fibrillation (HCC)   . CHF (congestive heart failure) (HCC)   . Diabetes mellitus without complication (HCC)   . Hypertension     Past Surgical History:  Procedure Laterality Date  . ABSCESS DRAINAGE    . CESAREAN SECTION    . CHOLECYSTECTOMY    . pacemaker      MEDICATIONS:  Prior to Admission medications   Medication Sig Start Date End Date Taking? Authorizing Provider  ascorbic acid (VITAMIN C) 500 MG tablet Take 1 tablet (500 mg total) by mouth daily. 08/13/19   Arnetha Courser, MD  benzonatate (TESSALON) 200 MG capsule Take 1 capsule (200 mg total) by mouth 3 (three) times daily. 08/12/19   Arnetha Courser, MD  cholecalciferol (VITAMIN D) 25 MCG tablet Take 1 tablet (1,000 Units total) by mouth daily. 08/13/19   Arnetha Courser, MD  furosemide (LASIX) 40 MG tablet Take 80 mg by mouth daily. (take at about 4PM)    [provider]  glipiZIDE (GLUCOTROL XL) 5 MG 24 hr tablet Take 5 mg by mouth daily. 06/12/19   [provider]  Ipratropium-Albuterol (COMBIVENT) 20-100 MCG/ACT AERS respimat Inhale 1 puff into the lungs every 6 (six) hours. 08/12/19   Arnetha Courser, MD  metoprolol succinate (TOPROL-XL) 25 MG 24  hr tablet Take 12.5 mg by mouth daily.    [provider]  potassium chloride (KLOR-CON) 10 MEQ tablet Take 20 mEq by mouth 3 (three) times daily. 07/14/19   [provider]  rivaroxaban (XARELTO) 20 MG TABS tablet Take 20 mg by mouth every evening. 05/30/19   [provider]  zinc sulfate 220 (50 Zn) MG capsule Take 1 capsule (220 mg total) by mouth daily. 08/13/19   Arnetha Courser, MD    Physical Exam   Triage Vital Signs: ED Triage Vitals  Enc Vitals Group     BP 01/16/22 2347 (!) 120/50     Pulse Rate 01/16/22 2347 66     Resp 01/16/22 2347 18     Temp 01/16/22 2347 97.9 F (36.6 C)     Temp Source 01/16/22 2347 Oral     SpO2 01/16/22 2347 97 %     Weight 01/16/22 2352 278 lb (126.1 kg)     Height 01/16/22 2352 5\' 4"  (1.626 m)     Head Circumference --      Peak Flow --      Pain Score 01/16/22 2347 0     Pain Loc --      Pain Edu? --      Excl. in GC? --     Most recent vital signs: Vitals:   01/16/22 2347  BP: (!) 120/50  Pulse: 66  Resp: 18  Temp: 97.9 F (36.6 C)  SpO2: 97%    CONSTITUTIONAL: Alert and oriented and responds appropriately to questions.  Elderly, obese, chronically ill-appearing HEAD: Normocephalic, atraumatic EYES: Conjunctivae clear, pupils appear equal, sclera nonicteric ENT: normal nose; moist mucous membranes NECK: Supple, normal ROM CARD: RRR; S1 and S2 appreciated; no murmurs, no clicks, no rubs, no gallops RESP: Normal chest excursion without splinting or tachypnea; breath sounds clear and equal bilaterally; no wheezes, no rhonchi, no rales, no hypoxia or respiratory distress, speaking full sentences ABD/GI: Normal bowel sounds; non-distended; soft, non-tender, no rebound, no guarding, no peritoneal signs BACK: The back appears normal EXT: Normal ROM in all joints; no deformity noted, no pitting edema in bilateral lower extremities, skin changes consistent with chronic venous stasis dermatitis in bilateral lower  extremities SKIN: Normal color for age and race; warm; no rash on exposed skin NEURO: Moves all extremities equally, normal speech PSYCH: The patient's mood and manner are appropriate.   ED Results / Procedures / Treatments   LABS: (all labs ordered are listed, but only abnormal results are displayed) Labs Reviewed  CBC WITH DIFFERENTIAL/PLATELET  BASIC METABOLIC PANEL  BRAIN NATRIURETIC PEPTIDE  TROPONIN I (HIGH SENSITIVITY)     EKG:  EKG Interpretation  Date/Time:    Ventricular Rate:    PR Interval:    QRS Duration:   QT Interval:    QTC Calculation:   R Axis:     Text Interpretation:           RADIOLOGY: My personal review and interpretation of imaging:  ***  I have personally reviewed all radiology reports.   No results found.   PROCEDURES:  Critical Care performed: {CriticalCareYesNo:19197::"Yes, see critical care procedure note(s)","No"}   CRITICAL CARE Performed by: Rochele Raring   Total critical care time: *** minutes  Critical care time was exclusive of separately billable procedures and treating other patients.  Critical care was necessary to treat or prevent imminent or life-threatening deterioration.  Critical care was time spent personally by me on the following activities: development of treatment plan with patient and/or surrogate as well as nursing, discussions with consultants, evaluation of patient's response to treatment, examination of patient, obtaining history from patient or surrogate, ordering and performing treatments and interventions, ordering and review of laboratory studies, ordering and review of radiographic studies, pulse oximetry and re-evaluation of patient's condition.   Procedures    IMPRESSION / MDM / ASSESSMENT AND PLAN / ED COURSE  I reviewed the triage vital signs and the nursing notes.    ***  The patient is on the cardiac monitor to evaluate for evidence of arrhythmia and/or significant heart rate  changes.   DIFFERENTIAL DIAGNOSIS (includes but not limited to):   ***   Patient's presentation is most consistent with {EM COPA:27473}   PLAN: ***   MEDICATIONS GIVEN IN ED: Medications - No data to display   ED COURSE:  ***   CONSULTS:  ***   OUTSIDE RECORDS REVIEWED:  ***       FINAL CLINICAL IMPRESSION(S) / ED DIAGNOSES   Final diagnoses:  None     Rx / DC Orders   ED Discharge Orders     None        Note:  This document was prepared using Dragon voice recognition software and may include unintentional dictation errors.

## 2022-01-17 NOTE — ED Provider Notes (Signed)
[facility] Provider Note    Event Date/Time   First MD Initiated Contact with Patient 01/16/22 2340     (approximate)   History   Chest Pain   HPI  Jennifer Gregory is a 76 y.o. 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  States symptoms started around 10:20 PM.  She took a total of 2 nitroglycerin tablets at home and is now asymptomatic.  She states she felt very hot like she was burning inside.  She was very nauseated but never vomited.   History provided by patient and EMS.    Past Medical History:  Diagnosis Date   Atrial fibrillation (HCC)    CHF (congestive heart failure) (HCC)    Diabetes mellitus without complication (HCC)    Hypertension     Past Surgical History:  Procedure Laterality Date   ABSCESS DRAINAGE     CESAREAN SECTION     CHOLECYSTECTOMY     pacemaker      MEDICATIONS:  Prior to Admission medications   Medication Sig Start Date End Date Taking? Authorizing Provider  ascorbic acid (VITAMIN C) 500 MG tablet Take 1 tablet (500 mg total) by mouth daily. 08/13/19   Arnetha Courser, MD  benzonatate (TESSALON) 200 MG capsule Take 1 capsule (200 mg total) by mouth 3 (three) times daily. 08/12/19   Arnetha Courser, MD  cholecalciferol (VITAMIN D) 25 MCG tablet Take 1 tablet (1,000 Units total) by mouth daily. 08/13/19   Arnetha Courser, MD  furosemide (LASIX) 40 MG tablet Take 80 mg by mouth daily. (take at about 4PM)    [provider]  glipiZIDE (GLUCOTROL XL) 5 MG 24 hr tablet Take 5 mg by mouth daily. 06/12/19   [provider]  Ipratropium-Albuterol (COMBIVENT) 20-100 MCG/ACT AERS respimat Inhale 1 puff into the lungs every 6 (six) hours. 08/12/19   Arnetha Courser, MD  metoprolol succinate (TOPROL-XL) 25 MG 24 hr  tablet Take 12.5 mg by mouth daily.    [provider]  potassium chloride (KLOR-CON) 10 MEQ tablet Take 20 mEq by mouth 3 (three) times daily. 07/14/19   [provider]  rivaroxaban (XARELTO) 20 MG TABS tablet Take 20 mg by mouth every evening. 05/30/19   [provider]  zinc sulfate 220 (50 Zn) MG capsule Take 1 capsule (220 mg total) by mouth daily. 08/13/19   Arnetha Courser, MD    Physical Exam   Triage Vital Signs: ED Triage Vitals  Enc Vitals Group     BP 01/16/22 2347 (!) 120/50     Pulse Rate 01/16/22 2347 66     Resp 01/16/22 2347 18     Temp 01/16/22 2347 97.9 F (36.6 C)     Temp Source 01/16/22 2347 Oral     SpO2 01/16/22 2347 97 %     Weight 01/16/22 2352 278 lb (126.1 kg)     Height 01/16/22 2352 5\' 4"  (1.626 m)     Head Circumference --      Peak Flow --      Pain Score 01/16/22 2347 0     Pain Loc --      Pain Edu? --      Excl. in GC? --     Most recent vital signs: Vitals:   01/17/22 0415 01/17/22 0417  BP: 01/19/22)  136/54   Pulse: 68   Resp: 12   Temp:  97.8 F (36.6 C)  SpO2: 95%     CONSTITUTIONAL: Alert and oriented and responds appropriately to questions.  Elderly, obese, chronically ill-appearing HEAD: Normocephalic, atraumatic EYES: Conjunctivae clear, pupils appear equal, sclera nonicteric ENT: normal nose; moist mucous membranes NECK: Supple, normal ROM CARD: RRR; S1 and S2 appreciated; no murmurs, no clicks, no rubs, no gallops RESP: Normal chest excursion without splinting or tachypnea; breath sounds clear and equal bilaterally; no wheezes, no rhonchi, no rales, no hypoxia or respiratory distress, speaking full sentences ABD/GI: Normal bowel sounds; non-distended; soft, non-tender, no rebound, no guarding, no peritoneal signs BACK: The back appears normal EXT: Normal ROM in all joints; no deformity noted, no pitting edema in bilateral lower extremities, skin changes consistent with chronic venous stasis dermatitis in  bilateral lower extremities SKIN: Normal color for age and race; warm; no rash on exposed skin NEURO: Moves all extremities equally, normal speech PSYCH: The patient's mood and manner are appropriate.   ED Results / Procedures / Treatments   LABS: (all labs ordered are listed, but only abnormal results are displayed) Labs Reviewed  CBC WITH DIFFERENTIAL/PLATELET - Abnormal; Notable for the following components:      Result Value   Hemoglobin 11.3 (*)    MCH 25.9 (*)    MCHC 29.0 (*)    RDW 16.2 (*)    All other components within normal limits  BASIC METABOLIC PANEL - Abnormal; Notable for the following components:   Glucose, Bld 135 (*)    BUN 25 (*)    Calcium 8.6 (*)    GFR, Estimated 59 (*)    All other components within normal limits  BRAIN NATRIURETIC PEPTIDE - Abnormal; Notable for the following components:   B Natriuretic Peptide 217.4 (*)    All other components within normal limits  TROPONIN I (HIGH SENSITIVITY)  TROPONIN I (HIGH SENSITIVITY)     EKG:  EKG Interpretation  Date/Time:  Monday January 16 2022 23:52:30 EDT Ventricular Rate:  65 PR Interval:  181 QRS Duration: 209 QT Interval:  535 QTC Calculation: 557 R Axis:   211 Text Interpretation: Atrial fibrillation Right bundle branch block Lateral infarct, old Confirmed by Donnivan Villena, Baxter Hire [phone]) on 01/17/2022 12:05:56 AM         RADIOLOGY: My personal review and interpretation of imaging: Last x-ray clear.  I have personally reviewed all radiology reports.   DG Chest 2 View  Result Date: 01/17/2022 CLINICAL DATA:  Chest pain and shortness of breath. EXAM: CHEST - 2 VIEW COMPARISON:  October 28, 2021 FINDINGS: There is a dual lead AICD. Multiple overlying radiopaque cardiac lead wires are noted. The cardiac silhouette is mildly enlarged and unchanged in size. Mild atelectasis is seen within the left lung base. There is no evidence of a pleural effusion or pneumothorax. Degenerative changes are seen  throughout the thoracic spine. IMPRESSION: Mild left basilar atelectasis. Electronically Signed   By: Aram Candela M.D.   On: 01/17/2022 01:03     PROCEDURES:  Critical Care performed: No    .1-3 Lead EKG Interpretation  Performed by: Kiven Vangilder, Layla Maw, DO Authorized by: Zackrey Dyar, Layla Maw, DO     Interpretation: abnormal     ECG rate:  66   ECG rate assessment: normal     Rhythm: atrial fibrillation     Ectopy: PVCs     Conduction: normal       IMPRESSION / MDM /  ASSESSMENT AND PLAN / ED COURSE  I reviewed the triage vital signs and the nursing notes.    Patient here with chest pain, shortness of breath, diaphoresis and nausea.  Now asymptomatic after 2 nitroglycerin.  Reports she is unable to take aspirin due to being on Xarelto.  The patient is on the cardiac monitor to evaluate for evidence of arrhythmia and/or significant heart rate changes.   DIFFERENTIAL DIAGNOSIS (includes but not limited to):   ACS, PE, dissection, CHF, arrhythmia, GERD, musculoskeletal chest pain   Patient's presentation is most consistent with acute presentation with potential threat to life or bodily function.   PLAN: We will obtain CBC, BMP, troponin x2, BNP, chest x-ray and interrogate her pacemaker.  She is on cardiac monitoring.  Currently asymptomatic.  EKG shows atrial fibrillation that is rate controlled with right bundle branch block.  Previous EKG in 2021 showed persistent A-fib with a nonspecific intraventricular conduction delay.  She does have some Q waves in lateral leads but no other ischemic abnormality.  MEDICATIONS GIVEN IN ED: Medications - No data to display   ED COURSE: Patient's labs show no leukocytosis.  Normal electrolytes.  Troponin x2 negative.  BNP is 217 but chest x-ray reviewed and interpreted by myself and the radiologist is clear.  No evidence seen on cardiac monitoring.  We have attempted to transmit the interrogation from her [employer] pacemaker multiple times  without success.  I do not feel we need to call someone in to interrogate her pacemaker tonight.  She states she has an appointment in the morning to have it interrogated and has follow-up with her cardiologist already scheduled on September 5.  She is still asymptomatic and comfortable with plan for discharge home.   At this time, I do not feel there is any life-threatening condition present. I reviewed all nursing notes, vitals, pertinent previous records.  All lab and urine results, EKGs, imaging ordered have been independently reviewed and interpreted by myself.  I reviewed all available radiology reports from any imaging ordered this visit.  Based on my assessment, I feel the patient is safe to be discharged home without further emergent workup and can continue workup as an outpatient as needed. Discussed all findings, treatment plan as well as usual and customary return precautions.  They verbalize understanding and are comfortable with this plan.  Outpatient follow-up has been provided as needed.  All questions have been answered.    CONSULTS: Admission considered but patient is asymptomatic with reassuring work-up and is comfortable with plan for follow-up with her cardiologist in several days.   OUTSIDE RECORDS REVIEWED: Reviewed patient's last cardiology note on 10/18/2021.       FINAL CLINICAL IMPRESSION(S) / ED DIAGNOSES   Final diagnoses:  Nonspecific chest pain     Rx / DC Orders   ED Discharge Orders     None        Note:  This document was prepared using Dragon voice recognition software and may include unintentional dictation errors.   Elisheva Fallas, Layla Maw, DO 01/17/22 [id number]

## 2022-11-20 DIAGNOSIS — C50912 Malignant neoplasm of unspecified site of left female breast: Secondary | ICD-10-CM

## 2022-11-20 HISTORY — DX: Malignant neoplasm of unspecified site of left female breast: C50.912

## 2023-01-02 ENCOUNTER — Other Ambulatory Visit: Payer: Self-pay | Admitting: Pediatrics

## 2023-01-02 DIAGNOSIS — N6323 Unspecified lump in the left breast, lower outer quadrant: Secondary | ICD-10-CM

## 2023-01-03 ENCOUNTER — Other Ambulatory Visit: Payer: Self-pay | Admitting: *Deleted

## 2023-01-03 ENCOUNTER — Inpatient Hospital Stay
Admission: RE | Admit: 2023-01-03 | Discharge: 2023-01-03 | Disposition: A | Discharge status: Home / Self Care | Payer: Self-pay | Source: Ambulatory Visit | Attending: Family Medicine | Admitting: Family Medicine

## 2023-01-03 DIAGNOSIS — Z1231 Encounter for screening mammogram for malignant neoplasm of breast: Secondary | ICD-10-CM

## 2023-01-04 ENCOUNTER — Ambulatory Visit
Admission: RE | Admit: 2023-01-04 | Discharge: 2023-01-04 | Disposition: A | Discharge status: Home / Self Care | Payer: Medicare HMO | Source: Ambulatory Visit | Attending: Pediatrics | Admitting: Pediatrics

## 2023-01-04 ENCOUNTER — Other Ambulatory Visit: Payer: Self-pay | Admitting: Pediatrics

## 2023-01-04 DIAGNOSIS — N6323 Unspecified lump in the left breast, lower outer quadrant: Secondary | ICD-10-CM

## 2023-01-08 ENCOUNTER — Other Ambulatory Visit: Payer: Self-pay | Admitting: Physician Assistant

## 2023-01-08 DIAGNOSIS — N63 Unspecified lump in unspecified breast: Secondary | ICD-10-CM

## 2023-01-08 DIAGNOSIS — R928 Other abnormal and inconclusive findings on diagnostic imaging of breast: Secondary | ICD-10-CM

## 2023-01-15 ENCOUNTER — Ambulatory Visit: Admission: RE | Admit: 2023-01-15 | Payer: Medicare HMO | Source: Ambulatory Visit

## 2023-01-15 ENCOUNTER — Ambulatory Visit
Admission: RE | Admit: 2023-01-15 | Discharge: 2023-01-15 | Disposition: A | Discharge status: Home / Self Care | Payer: Medicare HMO | Source: Ambulatory Visit | Attending: Physician Assistant | Admitting: Physician Assistant

## 2023-01-15 ENCOUNTER — Other Ambulatory Visit: Payer: Self-pay | Admitting: Physician Assistant

## 2023-01-15 DIAGNOSIS — Z17421 Hormone receptor negative with human epidermal growth factor receptor 2 negative status: Secondary | ICD-10-CM

## 2023-01-15 DIAGNOSIS — N63 Unspecified lump in unspecified breast: Secondary | ICD-10-CM | POA: Insufficient documentation

## 2023-01-15 DIAGNOSIS — R928 Other abnormal and inconclusive findings on diagnostic imaging of breast: Secondary | ICD-10-CM | POA: Insufficient documentation

## 2023-01-15 DIAGNOSIS — N6310 Unspecified lump in the right breast, unspecified quadrant: Secondary | ICD-10-CM

## 2023-01-15 DIAGNOSIS — C50919 Malignant neoplasm of unspecified site of unspecified female breast: Secondary | ICD-10-CM

## 2023-01-15 HISTORY — DX: Hormone receptor negative with human epidermal growth factor receptor 2 negative status: Z17.421

## 2023-01-15 HISTORY — DX: Unspecified lump in the right breast, unspecified quadrant: N63.10

## 2023-01-15 HISTORY — PX: OTHER SURGICAL HISTORY: SHX169

## 2023-01-15 HISTORY — PX: BREAST BIOPSY: SHX20

## 2023-01-15 HISTORY — DX: Malignant neoplasm of unspecified site of unspecified female breast: C50.919

## 2023-01-15 MED ORDER — LIDOCAINE 1 % OPTIME INJ - NO CHARGE
2.0000 mL | Freq: Once | INTRAMUSCULAR | Status: DC
  Filled 2023-01-15: qty 2

## 2023-01-15 MED ORDER — LIDOCAINE-EPINEPHRINE 1 %-1:100000 IJ SOLN
10.0000 mL | Freq: Once | INTRAMUSCULAR | Status: AC
  Administered 2023-01-15: 10 mL
  Filled 2023-01-15: qty 10

## 2023-01-15 MED ORDER — LIDOCAINE-EPINEPHRINE 1 %-1:100000 IJ SOLN
8.0000 mL | Freq: Once | INTRAMUSCULAR | Status: AC
  Administered 2023-01-15: 8 mL
  Filled 2023-01-15: qty 8

## 2023-01-15 MED ORDER — LIDOCAINE-EPINEPHRINE 1 %-1:100000 IJ SOLN
10.0000 mL | Freq: Once | INTRAMUSCULAR | Status: DC
  Filled 2023-01-15: qty 10

## 2023-01-15 MED ORDER — LIDOCAINE 1 % OPTIME INJ - NO CHARGE
2.0000 mL | Freq: Once | INTRAMUSCULAR | Status: AC
  Administered 2023-01-15: 2 mL via INTRADERMAL
  Filled 2023-01-15: qty 2

## 2023-01-15 MED ORDER — LIDOCAINE HCL 1 % IJ SOLN
2.0000 mL | Freq: Once | INTRAMUSCULAR | Status: AC
  Administered 2023-01-15: 2 mL via INTRADERMAL
  Filled 2023-01-15: qty 2

## 2023-01-16 ENCOUNTER — Encounter: Payer: Self-pay | Admitting: *Deleted

## 2023-01-16 DIAGNOSIS — C50919 Malignant neoplasm of unspecified site of unspecified female breast: Secondary | ICD-10-CM

## 2023-01-16 NOTE — Progress Notes (Signed)
Received referral for newly diagnosed breast cancer from [facility].  Navigation initiated.  She will see Dr. Maia Plan on Thursday 8/29,  and Dr. [name] on 8/30.

## 2023-01-19 ENCOUNTER — Encounter: Payer: Self-pay | Admitting: Oncology

## 2023-01-19 ENCOUNTER — Other Ambulatory Visit: Payer: Self-pay | Admitting: *Deleted

## 2023-01-19 ENCOUNTER — Inpatient Hospital Stay: Payer: Medicare HMO | Attending: Oncology | Admitting: Oncology

## 2023-01-19 ENCOUNTER — Inpatient Hospital Stay: Payer: Medicare HMO

## 2023-01-19 ENCOUNTER — Encounter: Payer: Self-pay | Admitting: *Deleted

## 2023-01-19 ENCOUNTER — Telehealth: Payer: Self-pay | Admitting: *Deleted

## 2023-01-19 VITALS — BP 132/66 | HR 67 | Temp 95.9°F | Resp 20 | Ht 64.0 in | Wt 276.5 lb

## 2023-01-19 DIAGNOSIS — C50412 Malignant neoplasm of upper-outer quadrant of left female breast: Secondary | ICD-10-CM | POA: Diagnosis not present

## 2023-01-19 DIAGNOSIS — N6310 Unspecified lump in the right breast, unspecified quadrant: Secondary | ICD-10-CM | POA: Insufficient documentation

## 2023-01-19 DIAGNOSIS — C50919 Malignant neoplasm of unspecified site of unspecified female breast: Secondary | ICD-10-CM

## 2023-01-19 DIAGNOSIS — Z7189 Other specified counseling: Secondary | ICD-10-CM

## 2023-01-19 DIAGNOSIS — Z79899 Other long term (current) drug therapy: Secondary | ICD-10-CM | POA: Insufficient documentation

## 2023-01-19 DIAGNOSIS — Z171 Estrogen receptor negative status [ER-]: Secondary | ICD-10-CM

## 2023-01-19 MED ORDER — PROCHLORPERAZINE MALEATE 10 MG PO TABS
10.0000 mg | ORAL_TABLET | Freq: Four times a day (QID) | ORAL | 1 refills | Status: AC | PRN
Start: 2023-01-19 — End: ?

## 2023-01-19 MED ORDER — LIDOCAINE-PRILOCAINE 2.5-2.5 % EX CREA
TOPICAL_CREAM | CUTANEOUS | 3 refills | Status: AC
Start: 2023-01-19 — End: ?

## 2023-01-19 MED ORDER — DEXAMETHASONE 4 MG PO TABS
ORAL_TABLET | ORAL | 1 refills | Status: AC
Start: 2023-01-19 — End: ?

## 2023-01-19 MED ORDER — LORAZEPAM 0.5 MG PO TABS: 0.5000 mg | ORAL_TABLET | Freq: Once | ORAL | 0 refills | Status: AC

## 2023-01-19 MED ORDER — ONDANSETRON HCL 8 MG PO TABS: 8.0000 mg | ORAL_TABLET | Freq: Three times a day (TID) | ORAL | 1 refills | Status: DC | PRN

## 2023-01-19 NOTE — Progress Notes (Signed)
START OFF PATHWAY REGIMEN - Breast   [id number]:Carboplatin AUC=1.5 IV D1,8,15 + Paclitaxel 80 mg/m2 IV D1,8,15 + G-CSF q21 Days + Pembrolizumab 400 mg IV D1 q42 Days x 12 Weeks Followed by [facility] + G-CSF q21 Days + Pembrolizumab 400 mg IV D1 q42 Days x 12 Weeks:   Pembrolizumab cycles 1 through 4: A cycle is every 42 days:     Pembrolizumab    Chemotherapy cycles 1 through 4: A cycle is every 21 days:     Paclitaxel      Carboplatin      Filgrastim-xxxx    Chemotherapy cycles 5 through 8: A cycle is every 21 days:     Doxorubicin      Cyclophosphamide      Pegfilgrastim-xxxx   **Always confirm dose/schedule in your pharmacy ordering system**  Patient Characteristics: Preoperative or Nonsurgical Candidate, M0 (Clinical Staging), Up to cT4c, Any N, M0, Neoadjuvant Therapy followed by Surgery, Invasive Disease, Chemotherapy, HER2 Negative, ER Negative, Platinum Therapy Indicated and Candidate for Checkpoint Inhibitor Therapeutic Status: Preoperative or Nonsurgical Candidate, M0 (Clinical Staging) AJCC M Category: cM0 AJCC Grade: G3 ER Status: Negative (-) AJCC 8 Stage Grouping: IIB HER2 Status: Negative (-) AJCC T Category: cT2 AJCC N Category: cN0 PR Status: Negative (-) Breast Surgical Plan: Neoadjuvant Therapy followed by Surgery Intent of Therapy: Curative Intent, Discussed with Patient

## 2023-01-19 NOTE — Telephone Encounter (Signed)
Called to tell pharmacy  Jennifer Gregory that I need to cancel the ativan that I sent in. The pt. States that it does not help her. I told Jennifer Gregory that she needs xanax 0.5 for anxiety for MRI. She will take it 45 min.before she has the scan. She took the info over the counter

## 2023-01-19 NOTE — Progress Notes (Signed)
Accompanied patient and family to initial medical oncology appointment.   Reviewed Breast Cancer treatment handbook.   Care plan summary given to patient.   Reviewed outreach programs and cancer center services.   

## 2023-01-19 NOTE — Progress Notes (Signed)
Hematology/Oncology Consult note [facility] [phone] [phone]  Patient Care Team: Rayetta Humphrey, MD as PCP - General (Family Medicine) Hulen Luster, RN as Oncology Nurse Navigator   Name of the patient: Jennifer Gregory  [id number]  05/06/1946    Reason for referral-new diagnosis of breast cancer   Referring physician-Dr. Greggory Stallion  Date of visit: 01/19/23   History of presenting illness- Patient is a 77 year old female who self palpated a mass in the left breast since July 2024.  This was followed by a diagnostic mammogram and ultrasound.  Mammogram showed 3.4 x 1.8 x 3.5 cm mass at the 3 o'clock position of the left breast.  There was also an intraductal mass located at the 12 o'clock position of the right breast.  No axillary adenopathy was noted.  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  Right breast mass showed fragments of epithelial proliferation with atypia differential diagnosis includes intraductal papilloma with at least atypical ductal hyperplasia.  Patient has a history of heart failure with.  Served ejection fraction and atrial fibrillation for which she follows up with Dr. Darin Engels from cardiology.  She lives with her son and ambulates with the help of a walker.  She is independent of her ADLs and most IADLs.  ECOG PS- 2  Pain scale- 0   Review of systems- Review of Systems  Constitutional:  Positive for malaise/fatigue. Negative for chills, fever and weight loss.  HENT:  Negative for congestion, ear discharge and nosebleeds.   Eyes:  Negative for blurred vision.  Respiratory:  Negative for cough, hemoptysis, sputum production, shortness of breath and wheezing.   Cardiovascular:  Negative for chest pain, palpitations, orthopnea and claudication.  Gastrointestinal:  Negative for  abdominal pain, blood in stool, constipation, diarrhea, heartburn, melena, nausea and vomiting.  Genitourinary:  Negative for dysuria, flank pain, frequency, hematuria and urgency.  Musculoskeletal:  Negative for back pain, joint pain and myalgias.  Skin:  Negative for rash.  Neurological:  Negative for dizziness, tingling, focal weakness, seizures, weakness and headaches.  Endo/Heme/Allergies:  Does not bruise/bleed easily.  Psychiatric/Behavioral:  Negative for depression and suicidal ideas. The patient does not have insomnia.     Allergies  Allergen Reactions   Aspirin Other (See Comments)    Bleeding risk since patient is on xarelta   Ciprofloxacin Other (See Comments)    Other Reaction: RASH ITCHING    Codeine Other (See Comments)   Diazepam Other (See Comments) and Nausea And Vomiting   Dofetilide Other (See Comments)    Other Reaction: arrhythmia    Hydrocodone-Acetaminophen Other (See Comments) and Nausea And Vomiting   Morphine Nausea And Vomiting   Niacin Other (See Comments)    fatigue   Oxycodone-Acetaminophen Other (See Comments) and Nausea And Vomiting   Sotalol Other (See Comments)    Other Reaction: Prolonged QTc    Amiodarone Other (See Comments)    Affecting liver    Pyridoxine Other (See Comments)    Splitting headache   Sulfamethoxazole-Trimethoprim Swelling    Patient Active Problem List   Diagnosis Date Noted   Hypoxia    Cellulitis    Pressure injury of skin 08/09/2019   Pneumonia due to COVID-19 virus 08/08/2019   Atrial fibrillation with RVR (HCC)    Cellulitis of right lower extremity    Type 2 diabetes mellitus  without complication, without long-term current use of insulin (HCC)    Morbid obesity with BMI of 50.0-59.9, adult [facility])      Past Medical History:  Diagnosis Date   Atrial fibrillation (HCC)    CHF (congestive heart failure) (HCC)    Diabetes mellitus without complication (HCC)    Hypertension      Past Surgical History:   Procedure Laterality Date   ABSCESS DRAINAGE     BREAST BIOPSY Left 01/15/2023   [place] Bx Left Ribbon - Path pending   breast biopsy Right 01/15/2023   [place] Bx Right Coil Clip - Path Pending   BREAST BIOPSY Right 01/15/2023   [place] RT BREAST BX W LOC DEV 1ST LESION IMG BX SPEC US GUIDE 01/15/2023 [facility]   BREAST BIOPSY Left 01/15/2023   [place] LT BREAST BX W LOC DEV 1ST LESION IMG BX SPEC US GUIDE 01/15/2023 [facility]   CESAREAN SECTION     CHOLECYSTECTOMY     pacemaker      Social History   Socioeconomic History   Marital status: Single    Spouse name: Not on file   Number of children: Not on file   Years of education: Not on file   Highest education level: Not on file  Occupational History   Not on file  Tobacco Use   Smoking status: Never   Smokeless tobacco: Never  Substance and Sexual Activity   Alcohol use: Not Currently   Drug use: Not Currently   Sexual activity: Not on file  Other Topics Concern   Not on file  Social History Narrative   Not on file   Social Determinants of Health   Financial Resource Strain: Low Risk  (09/06/2022)   Received from [facility]   Overall Financial Resource Strain (CARDIA)    Difficulty of Paying Living Expenses: Not hard at all  Food Insecurity: Unknown (09/06/2022)   [facility]   Hunger Vital Sign    Worried About Running Out of Food in the Last Year: Never true    Ran Out of Food in the Last Year: Not on file  Transportation Needs: Unknown (09/06/2022)   Received from [facility] - Transportation    In the past 12 months, has lack of transportation kept you from medical appointments or from getting medications?: No    Lack of Transportation (Non-Medical): Not on file  Physical Activity: Not on file  Stress: Not on file  Social Connections: Not on file  Intimate Partner Violence: Not on file     Family History  Problem Relation Age of  Onset   Mesothelioma Father      Current Outpatient Medications:    ascorbic acid (VITAMIN C) 500 MG tablet, Take 1 tablet (500 mg total) by mouth daily., Disp: 90 tablet, Rfl: 0   benzonatate (TESSALON) 200 MG capsule, Take 1 capsule (200 mg total) by mouth 3 (three) times daily., Disp: 20 capsule, Rfl: 0   cholecalciferol (VITAMIN D) 25 MCG tablet, Take 1 tablet (1,000 Units total) by mouth daily., Disp: 90 tablet, Rfl: 0   furosemide (LASIX) 40 MG tablet, Take 80 mg by mouth daily. (take at about 4PM), Disp: , Rfl:    glipiZIDE (GLUCOTROL XL) 5 MG 24 hr tablet, Take 5 mg by mouth daily., Disp: , Rfl:    Ipratropium-Albuterol (COMBIVENT) 20-100 MCG/ACT AERS respimat, Inhale 1 puff into the lungs every 6 (six) hours., Disp: 4 g, Rfl:  0   metoprolol succinate (TOPROL-XL) 25 MG 24 hr tablet, Take 12.5 mg by mouth daily., Disp: , Rfl:    potassium chloride (KLOR-CON) 10 MEQ tablet, Take 20 mEq by mouth 3 (three) times daily., Disp: , Rfl:    rivaroxaban (XARELTO) 20 MG TABS tablet, Take 20 mg by mouth every evening., Disp: , Rfl:    zinc sulfate 220 (50 Zn) MG capsule, Take 1 capsule (220 mg total) by mouth daily., Disp: 90 capsule, Rfl: 0   Physical exam: There were no vitals filed for this visit. Physical Exam Constitutional:      Comments: Appears in no acute distress   Cardiovascular:     Rate and Rhythm: Normal rate and regular rhythm.     Heart sounds: Normal heart sounds.  Pulmonary:     Effort: Pulmonary effort is normal.     Breath sounds: Normal breath sounds.  Abdominal:     General: Bowel sounds are normal.     Palpations: Abdomen is soft.  Skin:    General: Skin is warm and dry.  Neurological:     Mental Status: She is alert and oriented to person, place, and time.   Breast exam: There is significant bruising noted at the site of her right breast biopsy with no distinct palpable mass.  In the left breast there is an ill-defined 4 cm mass at the 3 o'clock position of the  left breast roughly 10 cm away from the nipple.  No palpable bilateral axillary adenopathy.       Latest Ref Rng & Units 01/17/2022    1:09 AM  CMP  Glucose 70 - 99 mg/dL 409   BUN 8 - 23 mg/dL 25   Creatinine 8.11 - 1.00 mg/dL 9.14   Sodium 782 - 956 mmol/L 138   Potassium 3.5 - 5.1 mmol/L 4.2   Chloride 98 - 111 mmol/L 103   CO2 22 - 32 mmol/L 27   Calcium 8.9 - 10.3 mg/dL 8.6       Latest Ref Rng & Units 01/17/2022    1:09 AM  CBC  WBC 4.0 - 10.5 K/uL 7.6   Hemoglobin 12.0 - 15.0 g/dL 21.3   Hematocrit 08.6 - 46.0 % 38.9   Platelets 150 - 400 K/uL 155     No images are attached to the encounter.  [place] RT BREAST BX W LOC DEV 1ST LESION IMG BX SPEC US GUIDE  Addendum Date: 01/16/2023   ADDENDUM REPORT: 01/16/2023 11:05 ADDENDUM: PATHOLOGY revealed: Site 1. Breast, LEFT, needle core biopsy, 3 o'clock, 8 cm fn, ribbon clip - INVASIVE MAMMARY CARCINOMA WITH FOCAL SQUAMOUS DIFFERENTIATION, SEE NOTE - TUBULE FORMATION: SCORE 3 - NUCLEAR PLEOMORPHISM: SCORE 3 - MITOTIC COUNT: SCORE 2 - TOTAL SCORE: 8 - OVERALL GRADE: 3 - LYMPHOVASCULAR INVASION: NOT IDENTIFIED - CANCER LENGTH: 0.9 CM CALCIFICATIONS: NOT IDENTIFIED Pathology results are CONCORDANT with imaging findings, per Dr. Frederico Hamman. PATHOLOGY revealed: Site 2. Breast, RIGHT, needle core biopsy, 12 o'clock, 3 cm fn, coil clip - DETACHED FRAGMENTS OF EPITHELIAL PROLIFERATION WITH ATYPIA Diagnosis note 2. The differential diagnosis includes an intraductal papilloma with at least atypical ductal hyperplasia (ADH). Clinical correlation recommended. Pathology results are CONCORDANT with imaging findings, per Dr. Frederico Hamman with surgical consultation for excision. Pathology results and recommendations below were discussed with patient by telephone on 01/16/2023. Patient reported biopsy site with slight tenderness at the site. Post biopsy care instructions were reviewed, questions were answered and my direct phone number was provided  to patient. Patient was instructed to call the [facility] if any concerns or questions arise related to the biopsy. RECOMMENDATIONS: 1. Surgical and oncological consultation. Request for surgical and oncological consultation relayed to Irving Shows RN at [facility] by Lynett Grimes, RN on 01/16/2023 via Epic in-basket message. Pathology results reported by Lynett Grimes, RN on 01/16/2023. Electronically Signed   By: Frederico Hamman M.D.   On: 01/16/2023 11:05   Result Date: 01/16/2023 CLINICAL DATA:  77 year old female presenting for ultrasound-guided biopsy of a right and left breast mass. EXAM: ULTRASOUND GUIDED BILATERAL BREAST CORE NEEDLE BIOPSY COMPARISON:  Previous exam(s). PROCEDURE: I met with the patient and we discussed the procedure of ultrasound-guided biopsy, including benefits and alternatives. We discussed the high likelihood of a successful procedure. We discussed the risks of the procedure, including infection, bleeding, tissue injury, clip migration, and inadequate sampling. Informed written consent was given. The usual time-out protocol was performed immediately prior to the procedure. Lesion quadrant: Upper outer quadrant Using sterile technique and 1% Lidocaine as local anesthetic, under direct ultrasound visualization, a 14 gauge spring-loaded device was used to perform biopsy of a left breast mass at 3 o'clock, 8 cm from the nipple using a superior approach. At the conclusion of the procedure a ribbon shaped tissue marker clip was deployed into the biopsy cavity. Using sterile technique and 1% Lidocaine as local anesthetic, under direct ultrasound visualization, a 14 gauge spring-loaded device was used to perform biopsy of a retroareolar right breast mass at 12 o'clock 3 cm from the nipple using a lateral approach. At the conclusion of the procedure a coil shaped tissue marker clip was deployed into the biopsy cavity. Follow up 2 view mammogram was  performed and dictated separately. IMPRESSION: 1. Ultrasound guided biopsy of a left breast mass at 3 o'clock (ribbon clip). No apparent complications. 2. Ultrasound-guided biopsy of a right breast mass at 12 o'clock (coil clip). No apparent complications. Electronically Signed: By: Frederico Hamman M.D. On: 01/15/2023 09:11   [place] LT BREAST BX W LOC DEV 1ST LESION IMG BX SPEC US GUIDE  Addendum Date: 01/16/2023   ADDENDUM REPORT: 01/16/2023 11:05 ADDENDUM: PATHOLOGY revealed: Site 1. Breast, LEFT, needle core biopsy, 3 o'clock, 8 cm fn, ribbon clip - INVASIVE MAMMARY CARCINOMA WITH FOCAL SQUAMOUS DIFFERENTIATION, SEE NOTE - TUBULE FORMATION: SCORE 3 - NUCLEAR PLEOMORPHISM: SCORE 3 - MITOTIC COUNT: SCORE 2 - TOTAL SCORE: 8 - OVERALL GRADE: 3 - LYMPHOVASCULAR INVASION: NOT IDENTIFIED - CANCER LENGTH: 0.9 CM CALCIFICATIONS: NOT IDENTIFIED Pathology results are CONCORDANT with imaging findings, per Dr. Frederico Hamman. PATHOLOGY revealed: Site 2. Breast, RIGHT, needle core biopsy, 12 o'clock, 3 cm fn, coil clip - DETACHED FRAGMENTS OF EPITHELIAL PROLIFERATION WITH ATYPIA Diagnosis note 2. The differential diagnosis includes an intraductal papilloma with at least atypical ductal hyperplasia (ADH). Clinical correlation recommended. Pathology results are CONCORDANT with imaging findings, per Dr. Frederico Hamman with surgical consultation for excision. Pathology results and recommendations below were discussed with patient by telephone on 01/16/2023. Patient reported biopsy site with slight tenderness at the site. Post biopsy care instructions were reviewed, questions were answered and my direct phone number was provided to patient. Patient was instructed to call the [facility] if any concerns or questions arise related to the biopsy. RECOMMENDATIONS: 1. Surgical and oncological consultation. Request for surgical and oncological consultation relayed to Irving Shows RN at [facility] by Lynett Grimes, RN on 01/16/2023 via  Epic in-basket message. Pathology results reported by Lynett Grimes, RN on 01/16/2023. Electronically Signed   By: Frederico Hamman M.D.   On: 01/16/2023 11:05   Result Date: 01/16/2023 CLINICAL DATA:  77 year old female presenting for ultrasound-guided biopsy of a right and left breast mass. EXAM: ULTRASOUND GUIDED BILATERAL BREAST CORE NEEDLE BIOPSY COMPARISON:  Previous exam(s). PROCEDURE: I met with the patient and we discussed the procedure of ultrasound-guided biopsy, including benefits and alternatives. We discussed the high likelihood of a successful procedure. We discussed the risks of the procedure, including infection, bleeding, tissue injury, clip migration, and inadequate sampling. Informed written consent was given. The usual time-out protocol was performed immediately prior to the procedure. Lesion quadrant: Upper outer quadrant Using sterile technique and 1% Lidocaine as local anesthetic, under direct ultrasound visualization, a 14 gauge spring-loaded device was used to perform biopsy of a left breast mass at 3 o'clock, 8 cm from the nipple using a superior approach. At the conclusion of the procedure a ribbon shaped tissue marker clip was deployed into the biopsy cavity. Using sterile technique and 1% Lidocaine as local anesthetic, under direct ultrasound visualization, a 14 gauge spring-loaded device was used to perform biopsy of a retroareolar right breast mass at 12 o'clock 3 cm from the nipple using a lateral approach. At the conclusion of the procedure a coil shaped tissue marker clip was deployed into the biopsy cavity. Follow up 2 view mammogram was performed and dictated separately. IMPRESSION: 1. Ultrasound guided biopsy of a left breast mass at 3 o'clock (ribbon clip). No apparent complications. 2. Ultrasound-guided biopsy of a right breast mass at 12 o'clock (coil clip). No apparent complications. Electronically Signed: By: Frederico Hamman M.D.  On: 01/15/2023 09:11   MM CLIP PLACEMENT RIGHT  Result Date: 01/15/2023 CLINICAL DATA:  Post biopsy mammogram of the bilateral breasts for clip placement. EXAM: 3D DIAGNOSTIC BILATERAL MAMMOGRAM POST ULTRASOUND BIOPSY COMPARISON:  Previous exam(s). FINDINGS: 3D Mammographic images were obtained following ultrasound guided biopsy of the bilateral breasts. The biopsy marking clips are in expected position at the sites of biopsy. IMPRESSION: 1. Appropriate positioning of the ribbon shaped biopsy marking clip at the site of biopsy in the lateral left breast. 2. Appropriate positioning of the coil shaped biopsy marking clip in the retroareolar right breast. Final Assessment: Post Procedure Mammograms for Marker Placement Electronically Signed   By: Frederico Hamman M.D.   On: 01/15/2023 09:34   MM CLIP PLACEMENT LEFT  Result Date: 01/15/2023 CLINICAL DATA:  Post biopsy mammogram of the bilateral breasts for clip placement. EXAM: 3D DIAGNOSTIC BILATERAL MAMMOGRAM POST ULTRASOUND BIOPSY COMPARISON:  Previous exam(s). FINDINGS: 3D Mammographic images were obtained following ultrasound guided biopsy of the bilateral breasts. The biopsy marking clips are in expected position at the sites of biopsy. IMPRESSION: 1. Appropriate positioning of the ribbon shaped biopsy marking clip at the site of biopsy in the lateral left breast. 2. Appropriate positioning of the coil shaped biopsy marking clip in the retroareolar right breast. Final Assessment: Post Procedure Mammograms for Marker Placement Electronically Signed   By: Frederico Hamman M.D.   On: 01/15/2023 09:34   MM 3D DIAGNOSTIC MAMMOGRAM BILATERAL BREAST  Result Date: 01/04/2023 CLINICAL DATA:  Palpable mass in the LEFT breast for 4 weeks. EXAM: DIGITAL DIAGNOSTIC BILATERAL MAMMOGRAM WITH TOMOSYNTHESIS AND CAD; ULTRASOUND LEFT BREAST LIMITED; ULTRASOUND RIGHT BREAST LIMITED TECHNIQUE: Bilateral digital diagnostic mammography and breast tomosynthesis was  performed. The images were evaluated with computer-aided detection. ; Targeted ultrasound examination of  the left breast was performed.; Targeted ultrasound examination of the right breast was performed COMPARISON:  Prior studies from [facility] dated 07/16/2014, 07/13/2014 ACR Breast Density Category d: The breasts are extremely dense, which lowers the sensitivity of mammography. FINDINGS: RIGHT BREAST: Mammogram: Possible mass in the central retroareolar region of the RIGHT breast is further evaluated with spot compression views. These views demonstrate dense fibroglandular tissue without definitive. Mammographic images were processed with CAD. Physical Exam: I palpate no abnormality in the 12 o'clock location of the RIGHT breast. Ultrasound: Targeted ultrasound is performed, showing no sonographic correlate for the questioned mass in the 12 o'clock location. Incidental note is made of an intraductal solid mass in the 12 o'clock location 1 centimeter from the nipple. There is internal blood flow by Doppler evaluation. Mass measures 0.7 x 0.3 x 0.4 centimeters. LEFT BREAST: Mammogram: There is asymmetric density in the UPPER-OUTER QUADRANT of the LEFT breast, corresponding to the area of patient's concern. Spot tangential view confirms a focal asymmetry with associated distortion in the LATERAL portion of the breast. Mammographic images were processed with CAD. Physical Exam: I palpate a discrete mobile firm mass in the UPPER-OUTER QUADRANT of the LEFT breast. Ultrasound: Targeted ultrasound is performed, showing an oval mass with indistinct and angular margins in the 3 o'clock location of the LEFT breast 8 centimeters from the nipple. There is internal blood flow by Doppler evaluation. Mass is 3.4 x 1.8 x 3.5 centimeters. Evaluation of the LEFT axilla is negative for adenopathy. IMPRESSION: 1. Intraductal mass in the 12 o'clock location of the RIGHT breast for which biopsy is recommended. 2. Palpable solid 3.5  centimeter mass in the 3 o'clock location of the LEFT breast, suspicious for malignancy. 3. No axillary adenopathy. RECOMMENDATION: 1. Ultrasound-guided core biopsy of intraductal mass in the 12 o'clock location of the RIGHT breast. 2. Ultrasound-guided core biopsy of mass in the 3 o'clock location of the LEFT breast. I have discussed the findings and recommendations with the patient. If applicable, a reminder letter will be sent to the patient regarding the next appointment. BI-RADS CATEGORY  5: Highly suggestive of malignancy. Electronically Signed   By: Norva Pavlov M.D.   On: 01/04/2023 15:57   [place] LIMITED ULTRASOUND INCLUDING AXILLA LEFT BREAST   Result Date: 01/04/2023 CLINICAL DATA:  Palpable mass in the LEFT breast for 4 weeks. EXAM: DIGITAL DIAGNOSTIC BILATERAL MAMMOGRAM WITH TOMOSYNTHESIS AND CAD; ULTRASOUND LEFT BREAST LIMITED; ULTRASOUND RIGHT BREAST LIMITED TECHNIQUE: Bilateral digital diagnostic mammography and breast tomosynthesis was performed. The images were evaluated with computer-aided detection. ; Targeted ultrasound examination of the left breast was performed.; Targeted ultrasound examination of the right breast was performed COMPARISON:  Prior studies from [facility] dated 07/16/2014, 07/13/2014 ACR Breast Density Category d: The breasts are extremely dense, which lowers the sensitivity of mammography. FINDINGS: RIGHT BREAST: Mammogram: Possible mass in the central retroareolar region of the RIGHT breast is further evaluated with spot compression views. These views demonstrate dense fibroglandular tissue without definitive. Mammographic images were processed with CAD. Physical Exam: I palpate no abnormality in the 12 o'clock location of the RIGHT breast. Ultrasound: Targeted ultrasound is performed, showing no sonographic correlate for the questioned mass in the 12 o'clock location. Incidental note is made of an intraductal solid mass in the 12 o'clock location 1 centimeter from the nipple.  There is internal blood flow by Doppler evaluation. Mass measures 0.7 x 0.3 x 0.4 centimeters. LEFT BREAST: Mammogram: There is asymmetric density in the UPPER-OUTER QUADRANT of the  LEFT breast, corresponding to the area of patient's concern. Spot tangential view confirms a focal asymmetry with associated distortion in the LATERAL portion of the breast. Mammographic images were processed with CAD. Physical Exam: I palpate a discrete mobile firm mass in the UPPER-OUTER QUADRANT of the LEFT breast. Ultrasound: Targeted ultrasound is performed, showing an oval mass with indistinct and angular margins in the 3 o'clock location of the LEFT breast 8 centimeters from the nipple. There is internal blood flow by Doppler evaluation. Mass is 3.4 x 1.8 x 3.5 centimeters. Evaluation of the LEFT axilla is negative for adenopathy. IMPRESSION: 1. Intraductal mass in the 12 o'clock location of the RIGHT breast for which biopsy is recommended. 2. Palpable solid 3.5 centimeter mass in the 3 o'clock location of the LEFT breast, suspicious for malignancy. 3. No axillary adenopathy. RECOMMENDATION: 1. Ultrasound-guided core biopsy of intraductal mass in the 12 o'clock location of the RIGHT breast. 2. Ultrasound-guided core biopsy of mass in the 3 o'clock location of the LEFT breast. I have discussed the findings and recommendations with the patient. If applicable, a reminder letter will be sent to the patient regarding the next appointment. BI-RADS CATEGORY  5: Highly suggestive of malignancy. Electronically Signed   By: Norva Pavlov M.D.   On: 01/04/2023 15:57   [place] LIMITED ULTRASOUND INCLUDING AXILLA RIGHT BREAST  Result Date: 01/04/2023 CLINICAL DATA:  Palpable mass in the LEFT breast for 4 weeks. EXAM: DIGITAL DIAGNOSTIC BILATERAL MAMMOGRAM WITH TOMOSYNTHESIS AND CAD; ULTRASOUND LEFT BREAST LIMITED; ULTRASOUND RIGHT BREAST LIMITED TECHNIQUE: Bilateral digital diagnostic mammography and breast tomosynthesis was performed. The  images were evaluated with computer-aided detection. ; Targeted ultrasound examination of the left breast was performed.; Targeted ultrasound examination of the right breast was performed COMPARISON:  Prior studies from [facility] dated 07/16/2014, 07/13/2014 ACR Breast Density Category d: The breasts are extremely dense, which lowers the sensitivity of mammography. FINDINGS: RIGHT BREAST: Mammogram: Possible mass in the central retroareolar region of the RIGHT breast is further evaluated with spot compression views. These views demonstrate dense fibroglandular tissue without definitive. Mammographic images were processed with CAD. Physical Exam: I palpate no abnormality in the 12 o'clock location of the RIGHT breast. Ultrasound: Targeted ultrasound is performed, showing no sonographic correlate for the questioned mass in the 12 o'clock location. Incidental note is made of an intraductal solid mass in the 12 o'clock location 1 centimeter from the nipple. There is internal blood flow by Doppler evaluation. Mass measures 0.7 x 0.3 x 0.4 centimeters. LEFT BREAST: Mammogram: There is asymmetric density in the UPPER-OUTER QUADRANT of the LEFT breast, corresponding to the area of patient's concern. Spot tangential view confirms a focal asymmetry with associated distortion in the LATERAL portion of the breast. Mammographic images were processed with CAD. Physical Exam: I palpate a discrete mobile firm mass in the UPPER-OUTER QUADRANT of the LEFT breast. Ultrasound: Targeted ultrasound is performed, showing an oval mass with indistinct and angular margins in the 3 o'clock location of the LEFT breast 8 centimeters from the nipple. There is internal blood flow by Doppler evaluation. Mass is 3.4 x 1.8 x 3.5 centimeters. Evaluation of the LEFT axilla is negative for adenopathy. IMPRESSION: 1. Intraductal mass in the 12 o'clock location of the RIGHT breast for which biopsy is recommended. 2. Palpable solid 3.5 centimeter mass in the 3  o'clock location of the LEFT breast, suspicious for malignancy. 3. No axillary adenopathy. RECOMMENDATION: 1. Ultrasound-guided core biopsy of intraductal mass in the 12 o'clock location of the  RIGHT breast. 2. Ultrasound-guided core biopsy of mass in the 3 o'clock location of the LEFT breast. I have discussed the findings and recommendations with the patient. If applicable, a reminder letter will be sent to the patient regarding the next appointment. BI-RADS CATEGORY  5: Highly suggestive of malignancy. Electronically Signed   By: Norva Pavlov M.D.   On: 01/04/2023 15:57   MM Outside Films Mammo  Result Date: 01/03/2023 This examination belongs to an outside facility and is stored here for comparison purposes only.  Contact the originating outside institution for any associated report or interpretation.  MM Outside Films Mammo  Result Date: 01/03/2023 This examination belongs to an outside facility and is stored here for comparison purposes only.  Contact the originating outside institution for any associated report or interpretation.  MM Outside Films Mammo  Result Date: 01/03/2023 This examination belongs to an outside facility and is stored here for comparison purposes only.  Contact the originating outside institution for any associated report or interpretation.   Assessment and plan- Patient is a 77 y.o. 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  This needs definitive excision with a lumpectomy.  In the left breast patient was found to have a 3.5 cm mass at the 3 o'clock position which was biopsied and consistent with grade 3 triple negative invasive mammary carcinoma with focal  squamousDifferentiation.  HER2 was equivocal by IHC and negative by FISH.  I did discuss this with pathology and it could not be definitively classified as a metaplastic carcinoma.  On imaging there was no axillary adenopathy noted.  This denotes a biologically aggressive subtype of breast cancer and I would recommend neoadjuvant chemotherapy.  Given her underlying cardiac issues I would like to avoid anthracycline based chemotherapy and therefore I will not be offering her keynote 522 regimen.  I recommend weekly CarboTaxol chemotherapy with carboplatin AUC 1.5 and Taxol at 80 mg/m for [redacted] weeks along with Keytruda 200 mg IV every 3 weeks.  After 12 weeks of treatment we will consider repeat imaging and consideration for surgery.  Discussed risks and benefits of chemotherapy including all but not limited to nausea vomiting low blood counts, risk of infections and hospitalization.  Treatment will be given with a curative intent.  Patient understands and agrees to proceed as planned  Ideally patient needs bilateral lumpectomy followed by left postlumpectomy radiation.  Patient is not certain if she would like to proceed with radiation after lumpectomy and I am referring her to radiation oncology to have this discussion.  If she decides she does not wish to pursue radiation and consideration will need to be given for left mastectomy.  I am getting bilateral breast MRI along with a PET scan at this time.  CBC with differential CMP CA 27-29 and labs for genetic testing will be sent today.   Cancer Staging  Malignant neoplasm of upper-outer quadrant of left breast in female, estrogen receptor negative (HCC) Staging form: Breast, AJCC 8th Edition - Clinical stage from 01/19/2023: Stage IIB (cT2, cN0, cM0, G3, ER-, PR-, HER2-) - Signed by Creig Hines, MD on 01/19/2023 Stage prefix: Initial diagnosis Histologic grading system: 3 grade system  Thank you for this kind referral and the opportunity to  participate in the care of this patient   Visit Diagnosis 1. Malignant neoplasm of upper-outer quadrant of left breast in female, estrogen receptor negative (HCC)   2. Goals of care, counseling/discussion     Dr. Owens Shark, MD, MPH [facility] at [facility] [phone] 01/19/2023

## 2023-01-20 ENCOUNTER — Other Ambulatory Visit: Payer: Self-pay

## 2023-01-23 ENCOUNTER — Encounter: Payer: Self-pay | Admitting: *Deleted

## 2023-01-23 DIAGNOSIS — F409 Phobic anxiety disorder, unspecified: Secondary | ICD-10-CM | POA: Insufficient documentation

## 2023-01-24 ENCOUNTER — Encounter: Payer: Self-pay | Admitting: *Deleted

## 2023-01-24 DIAGNOSIS — Z171 Estrogen receptor negative status [ER-]: Secondary | ICD-10-CM

## 2023-01-24 NOTE — Progress Notes (Signed)
Consult with Dr. Rushie Chestnut on 9/17 to discuss radiation and help plan for surgical options.

## 2023-01-25 NOTE — Progress Notes (Signed)
Pharmacist Chemotherapy Monitoring - Initial Assessment    Anticipated start date: 02/02/23   The following has been reviewed per standard work regarding the patient's treatment regimen: The patient's diagnosis, treatment plan and drug doses, and organ/hematologic function Lab orders and baseline tests specific to treatment regimen  The treatment plan start date, drug sequencing, and pre-medications Prior authorization status  Patient's documented medication list, including drug-drug interaction screen and prescriptions for anti-emetics and supportive care specific to the treatment regimen The drug concentrations, fluid compatibility, administration routes, and timing of the medications to be used The patient's access for treatment and lifetime cumulative dose history, if applicable  The patient's medication allergies and previous infusion related reactions, if applicable   Changes made to treatment plan:  N/A  Follow up needed:  N/A  TSH ordered for D1 release, Jennifer Gregory [place], [place], 01/25/2023  3:14 PM

## 2023-01-26 ENCOUNTER — Encounter: Payer: Self-pay | Admitting: Oncology

## 2023-01-26 ENCOUNTER — Ambulatory Visit: Payer: Self-pay | Admitting: General Surgery

## 2023-01-26 ENCOUNTER — Telehealth: Payer: Self-pay

## 2023-01-26 ENCOUNTER — Inpatient Hospital Stay: Payer: Medicare HMO

## 2023-01-26 NOTE — Telephone Encounter (Signed)
Order for breast MRI faxed to [phone].  [facility] [address] [place], [place] [address]  Called and spoke with Jennifer Gregory. Per [facility] she will need to call in 4-5 days to make her appointment. She was provided the appointment line number. [phone]. Encouraged her to call with any further questions.

## 2023-01-29 ENCOUNTER — Inpatient Hospital Stay: Payer: Medicare HMO | Attending: Oncology

## 2023-01-29 ENCOUNTER — Ambulatory Visit: Payer: Medicare HMO

## 2023-01-29 ENCOUNTER — Encounter: Payer: Self-pay | Admitting: Licensed Clinical Social Worker

## 2023-01-29 ENCOUNTER — Encounter: Payer: Self-pay | Admitting: Oncology

## 2023-01-29 DIAGNOSIS — Z5111 Encounter for antineoplastic chemotherapy: Secondary | ICD-10-CM | POA: Insufficient documentation

## 2023-01-29 DIAGNOSIS — Z171 Estrogen receptor negative status [ER-]: Secondary | ICD-10-CM | POA: Insufficient documentation

## 2023-01-29 DIAGNOSIS — Z79899 Other long term (current) drug therapy: Secondary | ICD-10-CM | POA: Insufficient documentation

## 2023-01-29 DIAGNOSIS — C50412 Malignant neoplasm of upper-outer quadrant of left female breast: Secondary | ICD-10-CM | POA: Insufficient documentation

## 2023-01-29 DIAGNOSIS — Z1379 Encounter for other screening for genetic and chromosomal anomalies: Secondary | ICD-10-CM | POA: Insufficient documentation

## 2023-01-30 ENCOUNTER — Encounter: Payer: Self-pay | Admitting: Oncology

## 2023-01-30 ENCOUNTER — Ambulatory Visit
Admission: RE | Admit: 2023-01-30 | Discharge: 2023-01-30 | Disposition: A | Discharge status: Home / Self Care | Payer: Medicare HMO | Source: Ambulatory Visit | Attending: Oncology | Admitting: Oncology

## 2023-01-30 ENCOUNTER — Encounter
Admission: RE | Admit: 2023-01-30 | Discharge: 2023-01-30 | Disposition: A | Discharge status: Home / Self Care | Payer: Medicare HMO | Source: Ambulatory Visit | Attending: Oncology

## 2023-01-30 VITALS — Ht 63.0 in | Wt 274.0 lb

## 2023-01-30 DIAGNOSIS — C50412 Malignant neoplasm of upper-outer quadrant of left female breast: Secondary | ICD-10-CM | POA: Insufficient documentation

## 2023-01-30 DIAGNOSIS — I700 Atherosclerosis of aorta: Secondary | ICD-10-CM | POA: Diagnosis not present

## 2023-01-30 DIAGNOSIS — I2510 Atherosclerotic heart disease of native coronary artery without angina pectoris: Secondary | ICD-10-CM | POA: Diagnosis not present

## 2023-01-30 DIAGNOSIS — K7460 Unspecified cirrhosis of liver: Secondary | ICD-10-CM | POA: Insufficient documentation

## 2023-01-30 DIAGNOSIS — I4891 Unspecified atrial fibrillation: Secondary | ICD-10-CM

## 2023-01-30 DIAGNOSIS — I10 Essential (primary) hypertension: Secondary | ICD-10-CM | POA: Insufficient documentation

## 2023-01-30 DIAGNOSIS — Z01812 Encounter for preprocedural laboratory examination: Secondary | ICD-10-CM

## 2023-01-30 DIAGNOSIS — Z171 Estrogen receptor negative status [ER-]: Secondary | ICD-10-CM | POA: Diagnosis not present

## 2023-01-30 DIAGNOSIS — E119 Type 2 diabetes mellitus without complications: Secondary | ICD-10-CM

## 2023-01-30 HISTORY — DX: Other complications of anesthesia, initial encounter: T88.59XA

## 2023-01-30 HISTORY — DX: Unspecified atrial fibrillation: I48.91

## 2023-01-30 HISTORY — DX: Sleep apnea, unspecified: G47.30

## 2023-01-30 HISTORY — DX: Gastro-esophageal reflux disease without esophagitis: K21.9

## 2023-01-30 HISTORY — DX: Thyrotoxicosis, unspecified without thyrotoxic crisis or storm: E05.90

## 2023-01-30 HISTORY — DX: Morbid (severe) obesity due to excess calories: E66.01

## 2023-01-30 HISTORY — DX: Vitamin D deficiency, unspecified: E55.9

## 2023-01-30 HISTORY — DX: Atherosclerotic heart disease of native coronary artery without angina pectoris: I25.10

## 2023-01-30 HISTORY — DX: Sick sinus syndrome: I49.5

## 2023-01-30 HISTORY — DX: Type 2 diabetes mellitus without complications: E11.9

## 2023-01-30 HISTORY — DX: Pneumonia, unspecified organism: J18.9

## 2023-01-30 HISTORY — DX: Essential (primary) hypertension: I10

## 2023-01-30 HISTORY — DX: COVID-19: U07.1

## 2023-01-30 HISTORY — DX: Localized edema: R60.0

## 2023-01-30 LAB — GLUCOSE, CAPILLARY: Glucose-Capillary: 101 mg/dL — ABNORMAL HIGH (ref 70–99)

## 2023-01-30 MED ORDER — ORAL CARE MOUTH RINSE: 15.0000 mL | Freq: Once | OROMUCOSAL | Status: AC

## 2023-01-30 MED ORDER — CHLORHEXIDINE GLUCONATE 0.12 % MT SOLN
15.0000 mL | Freq: Once | OROMUCOSAL | Status: AC
  Administered 2023-01-31: 15 mL via OROMUCOSAL

## 2023-01-30 MED ORDER — FAMOTIDINE 20 MG PO TABS
20.0000 mg | ORAL_TABLET | Freq: Once | ORAL | Status: AC
  Administered 2023-01-31: 20 mg via ORAL

## 2023-01-30 MED ORDER — CEFAZOLIN SODIUM-DEXTROSE 2-4 GM/100ML-% IV SOLN
2.0000 g | INTRAVENOUS | Status: AC
  Administered 2023-01-31: 3 g via INTRAVENOUS

## 2023-01-30 MED ORDER — FLUDEOXYGLUCOSE F - 18 (FDG) INJECTION
12.0000 | Freq: Once | INTRAVENOUS | Status: AC | PRN
  Administered 2023-01-30: 12.82 via INTRAVENOUS

## 2023-01-30 MED ORDER — SODIUM CHLORIDE 0.9 % IV SOLN: INTRAVENOUS | Status: DC

## 2023-01-30 NOTE — Patient Instructions (Signed)
Your procedure is scheduled on: Wednesday, September 11 Report to the Registration Desk on the 1st floor of the [facility] To find out your arrival time, please call [phone] between 1PM - 3PM on: Tuesday, September 10 If your arrival time is 6:00 am, do not arrive before that time as the [facility] entrance doors do not open until 6:00 am.  REMEMBER: Instructions that are not followed completely may result in serious medical risk, up to and including death; or upon the discretion of your surgeon and anesthesiologist your surgery may need to be rescheduled.  Do not eat or drink after midnight the night before surgery.  No gum chewing or hard candies.  One week prior to surgery: Stop Anti-inflammatories (NSAIDS) such as Advil, Aleve, Ibuprofen, Motrin, Naproxen, Naprosyn and Aspirin based products such as Excedrin, Goody's Powder, BC Powder. Stop ANY OVER THE COUNTER supplements until after surgery. You may however, continue to take Tylenol if needed for pain up until the day of surgery.  Continue taking all prescribed medications with the exception of the following:  Xarelto - was stopped on Sunday, September 8. Resume AFTER surgery per surgeon instruction.  TAKE ONLY THESE MEDICATIONS THE MORNING OF SURGERY WITH A SIP OF WATER:  Metoprolol Celexa  No Alcohol for 24 hours before or after surgery.  No Smoking including e-cigarettes for 24 hours before surgery.  No chewable tobacco products for at least 6 hours before surgery.  No nicotine patches on the day of surgery.  Do not use any "recreational" drugs for at least a week (preferably 2 weeks) before your surgery.  Please be advised that the combination of cocaine and anesthesia may have negative outcomes, up to and including death. If you test positive for cocaine, your surgery will be cancelled.  On the morning of surgery brush your teeth with toothpaste and water, you may rinse your mouth with mouthwash if you  wish. Do not swallow any toothpaste or mouthwash.  Use CHG Soap as directed on instruction sheet.  Do not wear jewelry, make-up, hairpins, clips or nail polish.  For welded (permanent) jewelry: bracelets, anklets, waist bands, etc.  Please have this removed prior to surgery.  If it is not removed, there is a chance that hospital personnel will need to cut it off on the day of surgery.  Do not wear lotions, powders, or perfumes.   Do not shave body hair from the neck down 48 hours before surgery.  Contact lenses, hearing aids and dentures may not be worn into surgery.  Do not bring valuables to the hospital. [facility] is not responsible for any missing/lost belongings or valuables.   Notify your doctor if there is any change in your medical condition (cold, fever, infection).  Wear comfortable clothing (specific to your surgery type) to the hospital.  After surgery, you can help prevent lung complications by doing breathing exercises.  Take deep breaths and cough every 1-2 hours.   If you are being discharged the day of surgery, you will not be allowed to drive home. You will need a responsible individual to drive you home and stay with you for 24 hours after surgery.   If you are taking public transportation, you will need to have a responsible individual with you.  Please call the Pre-admissions Testing Dept. at [phone] if you have any questions about these instructions.  Surgery Visitation Policy:  Patients having surgery or a procedure may have two visitors.  Children under the age of  16 must have an adult with them who is not the patient.     Preparing for Surgery with CHLORHEXIDINE GLUCONATE (CHG) Soap  Chlorhexidine Gluconate (CHG) Soap  o An antiseptic cleaner that kills germs and bonds with the skin to continue killing germs even after washing  o Used for showering the night before surgery and morning of surgery  Before surgery, you can play an  important role by reducing the number of germs on your skin.  CHG (Chlorhexidine gluconate) soap is an antiseptic cleanser which kills germs and bonds with the skin to continue killing germs even after washing.  Please do not use if you have an allergy to CHG or antibacterial soaps. If your skin becomes reddened/irritated stop using the CHG.  1. Shower the NIGHT BEFORE SURGERY and the MORNING OF SURGERY with CHG soap.  2. If you choose to wash your hair, wash your hair first as usual with your normal shampoo.  3. After shampooing, rinse your hair and body thoroughly to remove the shampoo.  4. Use CHG as you would any other liquid soap. You can apply CHG directly to the skin and wash gently with a scrungie or a clean washcloth.  5. Apply the CHG soap to your body only from the neck down. Do not use on open wounds or open sores. Avoid contact with your eyes, ears, mouth, and genitals (private parts). Wash face and genitals (private parts) with your normal soap.  6. Wash thoroughly, paying special attention to the area where your surgery will be performed.  7. Thoroughly rinse your body with warm water.  8. Do not shower/wash with your normal soap after using and rinsing off the CHG soap.  9. Pat yourself dry with a clean towel.  10. Wear clean pajamas to bed the night before surgery.  12. Place clean sheets on your bed the night of your first shower and do not sleep with pets.  13. Shower again with the CHG soap on the day of surgery prior to arriving at the hospital.  14. Do not apply any deodorants/lotions/powders.  15. Please wear clean clothes to the hospital.

## 2023-01-31 ENCOUNTER — Other Ambulatory Visit: Payer: Self-pay

## 2023-01-31 ENCOUNTER — Ambulatory Visit: Payer: Medicare HMO | Admitting: Certified Registered"

## 2023-01-31 ENCOUNTER — Encounter: Payer: Self-pay | Admitting: General Surgery

## 2023-01-31 ENCOUNTER — Ambulatory Visit: Payer: Self-pay | Admitting: Urgent Care

## 2023-01-31 ENCOUNTER — Encounter
Admission: RE | Disposition: A | Discharge status: Home / Self Care | Payer: Self-pay | Source: Home / Self Care | Attending: General Surgery

## 2023-01-31 ENCOUNTER — Ambulatory Visit
Admission: RE | Admit: 2023-01-31 | Discharge: 2023-01-31 | Disposition: A | Discharge status: Home / Self Care | Payer: Medicare HMO | Attending: General Surgery | Admitting: General Surgery

## 2023-01-31 ENCOUNTER — Ambulatory Visit: Payer: Medicare HMO

## 2023-01-31 DIAGNOSIS — N6315 Unspecified lump in the right breast, overlapping quadrants: Secondary | ICD-10-CM | POA: Diagnosis not present

## 2023-01-31 DIAGNOSIS — E119 Type 2 diabetes mellitus without complications: Secondary | ICD-10-CM | POA: Insufficient documentation

## 2023-01-31 DIAGNOSIS — C50412 Malignant neoplasm of upper-outer quadrant of left female breast: Secondary | ICD-10-CM | POA: Diagnosis not present

## 2023-01-31 DIAGNOSIS — I5032 Chronic diastolic (congestive) heart failure: Secondary | ICD-10-CM | POA: Diagnosis not present

## 2023-01-31 DIAGNOSIS — I4891 Unspecified atrial fibrillation: Secondary | ICD-10-CM | POA: Diagnosis not present

## 2023-01-31 DIAGNOSIS — Z01812 Encounter for preprocedural laboratory examination: Secondary | ICD-10-CM

## 2023-01-31 DIAGNOSIS — Z171 Estrogen receptor negative status [ER-]: Secondary | ICD-10-CM | POA: Diagnosis not present

## 2023-01-31 DIAGNOSIS — Z79899 Other long term (current) drug therapy: Secondary | ICD-10-CM | POA: Diagnosis not present

## 2023-01-31 DIAGNOSIS — Z6841 Body Mass Index (BMI) 40.0 and over, adult: Secondary | ICD-10-CM | POA: Diagnosis not present

## 2023-01-31 DIAGNOSIS — E785 Hyperlipidemia, unspecified: Secondary | ICD-10-CM | POA: Insufficient documentation

## 2023-01-31 DIAGNOSIS — C50812 Malignant neoplasm of overlapping sites of left female breast: Secondary | ICD-10-CM | POA: Diagnosis present

## 2023-01-31 DIAGNOSIS — Z7901 Long term (current) use of anticoagulants: Secondary | ICD-10-CM | POA: Diagnosis not present

## 2023-01-31 DIAGNOSIS — I2510 Atherosclerotic heart disease of native coronary artery without angina pectoris: Secondary | ICD-10-CM | POA: Diagnosis not present

## 2023-01-31 DIAGNOSIS — Z800 Family history of malignant neoplasm of digestive organs: Secondary | ICD-10-CM | POA: Insufficient documentation

## 2023-01-31 DIAGNOSIS — I110 Hypertensive heart disease with heart failure: Secondary | ICD-10-CM | POA: Diagnosis not present

## 2023-01-31 DIAGNOSIS — I495 Sick sinus syndrome: Secondary | ICD-10-CM | POA: Diagnosis not present

## 2023-01-31 DIAGNOSIS — Z950 Presence of cardiac pacemaker: Secondary | ICD-10-CM | POA: Insufficient documentation

## 2023-01-31 HISTORY — PX: PORTACATH PLACEMENT: SHX2246

## 2023-01-31 LAB — CBC
HCT: 41.1 % (ref 36.0–46.0)
Hemoglobin: 12.7 g/dL (ref 12.0–15.0)
MCH: 27.3 pg (ref 26.0–34.0)
MCHC: 30.9 g/dL (ref 30.0–36.0)
MCV: 88.4 fL (ref 80.0–100.0)
Platelets: 196 10*3/uL (ref 150–400)
RBC: 4.65 MIL/uL (ref 3.87–5.11)
RDW: 14.3 % (ref 11.5–15.5)
WBC: 6.5 10*3/uL (ref 4.0–10.5)
nRBC: 0 % (ref 0.0–0.2)

## 2023-01-31 LAB — BASIC METABOLIC PANEL WITH GFR
Anion gap: 9 (ref 5–15)
BUN: 19 mg/dL (ref 8–23)
CO2: 29 mmol/L (ref 22–32)
Calcium: 9.2 mg/dL (ref 8.9–10.3)
Chloride: 101 mmol/L (ref 98–111)
Creatinine, Ser: 0.99 mg/dL (ref 0.44–1.00)
GFR, Estimated: 58 mL/min — ABNORMAL LOW
Glucose, Bld: 128 mg/dL — ABNORMAL HIGH (ref 70–99)
Potassium: 4 mmol/L (ref 3.5–5.1)
Sodium: 139 mmol/L (ref 135–145)

## 2023-01-31 LAB — GLUCOSE, CAPILLARY
Glucose-Capillary: 99 mg/dL (ref 70–99)
Glucose-Capillary: 121 mg/dL — ABNORMAL HIGH (ref 70–99)

## 2023-01-31 SURGERY — INSERTION, TUNNELED CENTRAL VENOUS DEVICE, WITH PORT
Anesthesia: General | Site: Chest | Laterality: Right

## 2023-01-31 MED ORDER — LACTATED RINGERS IV SOLN: INTRAVENOUS | Status: DC

## 2023-01-31 MED ORDER — ACETAMINOPHEN 10 MG/ML IV SOLN: 1000.0000 mg | Freq: Once | INTRAVENOUS | Status: DC | PRN

## 2023-01-31 MED ORDER — BUPIVACAINE-EPINEPHRINE (PF) 0.25% -1:200000 IJ SOLN
INTRAMUSCULAR | Status: AC
  Filled 2023-01-31: qty 30

## 2023-01-31 MED ORDER — PHENYLEPHRINE HCL (PRESSORS) 10 MG/ML IV SOLN
INTRAVENOUS | Status: DC | PRN
  Administered 2023-01-31 (×3): 80 ug via INTRAVENOUS

## 2023-01-31 MED ORDER — TRAMADOL HCL 50 MG PO TABS: 50.0000 mg | ORAL_TABLET | Freq: Four times a day (QID) | ORAL | 0 refills | Status: AC | PRN

## 2023-01-31 MED ORDER — DEXAMETHASONE SODIUM PHOSPHATE 10 MG/ML IJ SOLN
INTRAMUSCULAR | Status: DC | PRN
Start: 2023-01-31 — End: 2023-01-31
  Administered 2023-01-31: 10 mg via INTRAVENOUS

## 2023-01-31 MED ORDER — ACETAMINOPHEN 10 MG/ML IV SOLN
INTRAVENOUS | Status: AC
  Filled 2023-01-31: qty 100

## 2023-01-31 MED ORDER — FENTANYL CITRATE (PF) 100 MCG/2ML IJ SOLN
INTRAMUSCULAR | Status: DC | PRN
Start: 2023-01-31 — End: 2023-01-31
  Administered 2023-01-31: 50 ug via INTRAVENOUS

## 2023-01-31 MED ORDER — SUCCINYLCHOLINE CHLORIDE 200 MG/10ML IV SOSY
PREFILLED_SYRINGE | INTRAVENOUS | Status: DC | PRN
  Administered 2023-01-31: 140 mg via INTRAVENOUS

## 2023-01-31 MED ORDER — FAMOTIDINE 20 MG PO TABS
ORAL_TABLET | ORAL | Status: AC
  Filled 2023-01-31: qty 1

## 2023-01-31 MED ORDER — SODIUM CHLORIDE (PF) 0.9 % IJ SOLN
INTRAMUSCULAR | Status: AC
  Filled 2023-01-31: qty 50

## 2023-01-31 MED ORDER — OXYCODONE HCL 5 MG/5ML PO SOLN: 5.0000 mg | Freq: Once | ORAL | Status: AC | PRN

## 2023-01-31 MED ORDER — FENTANYL CITRATE (PF) 100 MCG/2ML IJ SOLN
INTRAMUSCULAR | Status: AC
  Filled 2023-01-31: qty 2

## 2023-01-31 MED ORDER — ONDANSETRON HCL 4 MG/2ML IJ SOLN
INTRAMUSCULAR | Status: AC
  Filled 2023-01-31: qty 2

## 2023-01-31 MED ORDER — CHLORHEXIDINE GLUCONATE 0.12 % MT SOLN
OROMUCOSAL | Status: AC
  Filled 2023-01-31: qty 15

## 2023-01-31 MED ORDER — ONDANSETRON HCL 4 MG/2ML IJ SOLN
INTRAMUSCULAR | Status: DC | PRN
  Administered 2023-01-31: 4 mg via INTRAVENOUS

## 2023-01-31 MED ORDER — SODIUM CHLORIDE 0.9 % IV SOLN
INTRAVENOUS | Status: DC | PRN
  Administered 2023-01-31: 50 mL via INTRAMUSCULAR

## 2023-01-31 MED ORDER — PROPOFOL 1000 MG/100ML IV EMUL
INTRAVENOUS | Status: AC
  Filled 2023-01-31: qty 100

## 2023-01-31 MED ORDER — OXYCODONE HCL 5 MG PO TABS
ORAL_TABLET | ORAL | Status: AC
  Filled 2023-01-31: qty 1

## 2023-01-31 MED ORDER — BUPIVACAINE-EPINEPHRINE (PF) 0.25% -1:200000 IJ SOLN
INTRAMUSCULAR | Status: DC | PRN
  Administered 2023-01-31: 10 mL

## 2023-01-31 MED ORDER — MIDAZOLAM HCL 2 MG/2ML IJ SOLN
INTRAMUSCULAR | Status: DC | PRN
  Administered 2023-01-31 (×2): 1 mg via INTRAVENOUS

## 2023-01-31 MED ORDER — SODIUM CHLORIDE (PF) 0.9 % IJ SOLN
INTRAMUSCULAR | Status: DC | PRN
  Administered 2023-01-31: 500 mL

## 2023-01-31 MED ORDER — OXYCODONE HCL 5 MG PO TABS
5.0000 mg | ORAL_TABLET | Freq: Once | ORAL | Status: AC | PRN
  Administered 2023-01-31: 5 mg via ORAL

## 2023-01-31 MED ORDER — HEPARIN SODIUM (PORCINE) 5000 UNIT/ML IJ SOLN
INTRAMUSCULAR | Status: AC
  Filled 2023-01-31: qty 1

## 2023-01-31 MED ORDER — PROPOFOL 10 MG/ML IV BOLUS
INTRAVENOUS | Status: DC | PRN
Start: 2023-01-31 — End: 2023-01-31
  Administered 2023-01-31: 160 mg via INTRAVENOUS

## 2023-01-31 MED ORDER — ONDANSETRON HCL 4 MG/2ML IJ SOLN
4.0000 mg | Freq: Once | INTRAMUSCULAR | Status: AC | PRN
  Administered 2023-01-31: 4 mg via INTRAVENOUS

## 2023-01-31 MED ORDER — ACETAMINOPHEN 10 MG/ML IV SOLN
INTRAVENOUS | Status: DC | PRN
  Administered 2023-01-31: 1000 mg via INTRAVENOUS

## 2023-01-31 MED ORDER — FENTANYL CITRATE (PF) 100 MCG/2ML IJ SOLN: 25.0000 ug | INTRAMUSCULAR | Status: DC | PRN

## 2023-01-31 MED ORDER — CEFAZOLIN SODIUM-DEXTROSE 2-4 GM/100ML-% IV SOLN
INTRAVENOUS | Status: AC
  Filled 2023-01-31: qty 100

## 2023-01-31 MED ORDER — MIDAZOLAM HCL 2 MG/2ML IJ SOLN
INTRAMUSCULAR | Status: AC
  Filled 2023-01-31: qty 2

## 2023-01-31 MED ORDER — ONDANSETRON HCL 4 MG PO TABS: 4.0000 mg | ORAL_TABLET | Freq: Every day | ORAL | 1 refills | Status: AC | PRN

## 2023-01-31 SURGICAL SUPPLY — 35 items
ADH SKN CLS APL DERMABOND .7 (GAUZE/BANDAGES/DRESSINGS) ×1
BAG DECANTER FOR FLEXI CONT (MISCELLANEOUS) ×1 IMPLANT
BLADE SURG 15 STRL LF DISP TIS (BLADE) ×1 IMPLANT
BLADE SURG 15 STRL SS (BLADE) ×1
BLADE SURG SZ11 CARB STEEL (BLADE) ×1 IMPLANT
BOOT SUTURE VASCULAR YLW (MISCELLANEOUS) ×1
CLAMP SUTURE YELLOW 5 PAIRS (MISCELLANEOUS) ×1 IMPLANT
DERMABOND ADVANCED .7 DNX12 (GAUZE/BANDAGES/DRESSINGS) ×1 IMPLANT
DRAPE C-ARM XRAY 36X54 (DRAPES) ×1 IMPLANT
ELECT REM PT RETURN 9FT ADLT (ELECTROSURGICAL) ×1
ELECTRODE REM PT RTRN 9FT ADLT (ELECTROSURGICAL) ×1 IMPLANT
GLOVE BIO SURGEON STRL SZ 6.5 (GLOVE) ×1 IMPLANT
GLOVE BIOGEL PI IND STRL 6.5 (GLOVE) ×1 IMPLANT
GOWN STRL REUS W/ TWL LRG LVL3 (GOWN DISPOSABLE) ×2 IMPLANT
GOWN STRL REUS W/TWL LRG LVL3 (GOWN DISPOSABLE) ×2
IV NS 500ML (IV SOLUTION) ×1
IV NS 500ML BAXH (IV SOLUTION) ×1 IMPLANT
KIT PORT POWER 8FR ISP CVUE (Port) ×1 IMPLANT
KIT TURNOVER KIT A (KITS) ×1 IMPLANT
LABEL OR SOLS (LABEL) ×1 IMPLANT
MANIFOLD NEPTUNE II (INSTRUMENTS) ×1 IMPLANT
NDL FILTER BLUNT 18X1 1/2 (NEEDLE) ×1 IMPLANT
NEEDLE FILTER BLUNT 18X1 1/2 (NEEDLE) ×1 IMPLANT
PACK PORT-A-CATH (MISCELLANEOUS) ×1 IMPLANT
SUT MNCRL AB 4-0 PS2 18 (SUTURE) ×1 IMPLANT
SUT PROLENE 2 0 FS (SUTURE) ×1 IMPLANT
SUT VIC AB 2-0 SH 27 (SUTURE)
SUT VIC AB 2-0 SH 27XBRD (SUTURE) ×1 IMPLANT
SUT VIC AB 3-0 SH 27 (SUTURE) ×1
SUT VIC AB 3-0 SH 27X BRD (SUTURE) ×1 IMPLANT
SYR 10ML LL (SYRINGE) ×2 IMPLANT
SYR 3ML LL SCALE MARK (SYRINGE) ×1 IMPLANT
TAG SUTURE CLAMP YLW 5PR (MISCELLANEOUS) ×1
TRAP FLUID SMOKE EVACUATOR (MISCELLANEOUS) ×1 IMPLANT
WATER STERILE IRR 500ML POUR (IV SOLUTION) ×1 IMPLANT

## 2023-01-31 NOTE — Transfer of Care (Signed)
Immediate Anesthesia Transfer of Care Note  Patient: Jennifer Gregory  Procedure(s) Performed: INSERTION PORT-A-CATH (Right: Chest)  Patient Location: PACU  Anesthesia Type:General  Level of Consciousness: awake  Airway & Oxygen Therapy: Patient Spontanous Breathing  Post-op Assessment: Report given to RN and Post -op Vital signs reviewed and stable  Post vital signs: Reviewed and stable  Last Vitals:  Vitals Value Taken Time  BP 137/80 01/31/23 0830  Temp    Pulse 66 01/31/23 0833  Resp 19 01/31/23 0833  SpO2 100 % 01/31/23 0833  Vitals shown include unfiled device data.  Last Pain:  Vitals:   01/31/23 0830  TempSrc:   PainSc: 0-No pain         Complications: No notable events documented.

## 2023-01-31 NOTE — Anesthesia Preprocedure Evaluation (Addendum)
Anesthesia Evaluation  Patient identified by MRN, date of birth, ID band Patient awake    Reviewed: Allergy & Precautions, NPO status , Patient's Chart, lab work & pertinent test results  History of Anesthesia Complications (+) history of anesthetic complications (aspiration in PACU requriing ICU course)  Airway Mallampati: III   Neck ROM: Full    Dental  (+) Poor Dentition, Missing, Chipped   Pulmonary sleep apnea    Pulmonary exam normal breath sounds clear to auscultation       Cardiovascular hypertension, + CAD and +CHF  + dysrhythmias (a fib on Xarelto) + pacemaker (SSS)  Rhythm:Irregular Rate:Normal  Echo 04/17/22:   ABNORMAL STRESS TEST. NORMAL RESTING STUDY WITH NO WALL MOTION   ABNORMALITIES AT REST AND PEAK STRESS.   VALVULAR REGURGITATION: TRIVIAL MR, TRIVIAL PR, MILD TR   NO VALVULAR STENOSIS   Note: POSITIVE EKG WITHOUT WALL MOTION ABNORMALITIES WITH STRESS.   NORMAL RESTING BP - APPROPRIATE RESPONSE   Myocardial perfusion 03/21/22:   1. Rest myocardial perfusion is normal.   2. Rest global myocardial blood flow is normal.   3. Normal rest left ventricular systolic function, wall motion, wall thickening, and volumes.   Patient refused stress testing. Denies CP.    Neuro/Psych negative neurological ROS     GI/Hepatic ,GERD  ,,  Endo/Other  diabetes, Type 2  Class 3 obesity  Renal/GU negative Renal ROS     Musculoskeletal   Abdominal   Peds  Hematology Breast CA   Anesthesia Other Findings Cardiology note 01/23/23:  A/P: 77 y.o. female who comes to clinic today for management of congestive heart failure  1. Chronic HFpEF- Patient currently endorses NYHA Class III symptoms she appears largely euvolemic and well-perfused on exam. Continue on low dose metoprolol tartrate, SGLT2 inhibitor considered but has a history of recurrent UTIs. Additionally, she did not tolerate the addition of spironolactone in  the past. I do not plan to make any changes to her regimen.  2. Afib- She was noted to be in atrial fibrillation permanently with intermittent rapid episodes on her last device interrogation. She remains on rivaroxaban for embolic prophylaxis and metoprolol for rate control. Rates well controlled today.   3. HTN-blood pressure is well-controlled in clinic.  4. CAD-nonobstructive on last cardiac catheterization. Stress echo in 2023 with positive EKG, however with no exercise-induced wall motion abnormalities. No further workup  5. Hyperlipidemia-her prior lipid panels show an LDL that is not at target (>100). She has prior intolerances to a statin. She was initiated on niacin by Dr. Greggory Stallion, but Ms. Degrave discontinued this due to symptoms of fatigue which she ascribed to a medication side effect. Refused referral to lipid clinic at last visit, can re-engage with this discussion at next visit.   6. Follow-up-3 months    Reproductive/Obstetrics                              Anesthesia Physical Anesthesia Plan  ASA: 3  Anesthesia Plan: General   Post-op Pain Management:    Induction: Intravenous  PONV Risk Score and Plan: 3 and Ondansetron, Dexamethasone and Treatment may vary due to age or medical condition  Airway Management Planned: LMA  Additional Equipment:   Intra-op Plan:   Post-operative Plan: Extubation in OR  Informed Consent: I have reviewed the patients History and Physical, chart, labs and discussed the procedure including the risks, benefits and alternatives for the proposed anesthesia with the  patient or authorized representative who has indicated his/her understanding and acceptance.     Dental advisory given  Plan Discussed with: CRNA  Anesthesia Plan Comments: (Patient consented for risks of anesthesia including but not limited to:  - adverse reactions to medications - damage to eyes, teeth, lips or other oral mucosa - nerve damage  due to positioning  - sore throat or hoarseness - damage to heart, brain, nerves, lungs, other parts of body or loss of life  Informed patient about role of CRNA in peri- and intra-operative care.  Patient voiced understanding.)         Anesthesia Quick Evaluation

## 2023-01-31 NOTE — Op Note (Signed)
SURGICAL PROCEDURE REPORT  DATE OF PROCEDURE: 01/31/2023   SURGEON: Dr. Hazle Quant   ANESTHESIA: Local with light IV sedation   PRE-OPERATIVE DIAGNOSIS: Triple negative breast cancer requiring durable central venous access for chemotherapy   POST-OPERATIVE DIAGNOSIS: Same  PROCEDURE(S): (cpt: 36561) 1.) Percutaneous access of Right internal jugular vein under ultrasound guidance 2.) Insertion of tunneled Right internal jugular central venous catheter with subcutaneous port  INTRAOPERATIVE FINDINGS: Patent easily compressible Right internal jugular vein with appropriate respiratory variations and well-secured tunneled central venous catheter with subcutaneous port at completion of the procedure  ESTIMATED BLOOD LOSS: Minimal (<20 mL)   SPECIMENS: None   IMPLANTS: 20F tunneled Bard PowerPort central venous catheter with subcutaneous port  DRAINS: None   COMPLICATIONS: None apparent   CONDITION AT COMPLETION: Hemodynamically stable, awake   DISPOSITION: PACU   INDICATION(S) FOR PROCEDURE:  Patient is a 77 y.o. female who presented with triple negative breast cancer requiring durable central venous access for chemotherapy. All risks, benefits, and alternatives to above elective procedures were discussed with the patient, who elected to proceed, and informed consent was accordingly obtained at that time.  DETAILS OF PROCEDURE:  Patient was brought to the operative suite and appropriately identified. In Trendelenburg position, Right IJ venous access site was prepped and draped in the usual sterile fashion, and following a brief timeout, percutaneous Right IJ venous access was obtained under ultrasound guidance using Seldinger technique, by which local anesthetic was injected over the Right IJ vein, and access needle was inserted under direct ultrasound visualization into the Right IJ vein, through which soft guidewire was advanced, over which access needle was withdrawn. Guidewire  was secured, attention was directed to injection of local anesthetic along the planned tunnel site, 2-3 cm transverse Right chest incision was made and confirmed to accommodate the subcutaneous port, and flushed catheter was tunneled retrograde from the port site over the Right chest to the Right IJ access site with the attached port well-secured to the catheter and within the subcutaneous pocket. Insertion sheath was advanced over the guidewire, which was withdrawn along with the insertion sheath dilator. The catheter was introduced through the sheath and left on the [place] junction under fluoro guidance and catheter cut to desire lenght. Catheter connected to port and fixed to the pocket on two side to avoid twisting. Port was confirmed to withdraw blood and flush easily, after which concentrated heparin was instilled into the port and catheter. Dermis at the subcutaneous pocket was re-approximated using buried interrupted 3-0 Vicryl suture, and 4-0 Monocryl suture was used to re-approximate skin at the insertion and subcutaneous port sites in running subcuticular fashion for the subcutaneous port and buried interrupted fashion for the insertion site. Skin was cleaned, dried, and sterile skin glue was applied. Patient was then safely transferred to PACU for a chest x-ray. Ultrasound images are available on paper chart and Fluoroscopy guidance images are available in Epic.

## 2023-01-31 NOTE — Discharge Instructions (Addendum)
  Diet: Resume home heart healthy regular diet.   Activity: Increase activity as tolerated. Light activity and walking are encouraged. Do not drive or drink alcohol if taking narcotic pain medications.  Wound care: May shower with soapy water and pat dry (do not rub incisions), but no baths or submerging incision underwater until follow-up. (no swimming)   Medications: Resume all home medications. For mild to moderate pain: acetaminophen (Tylenol) or ibuprofen (if no kidney disease). Combining Tylenol with alcohol can substantially increase your risk of causing liver disease. Narcotic pain medications, if prescribed, can be used for severe pain, though may cause nausea, constipation, and drowsiness. If you do not need the narcotic pain medication, you do not need to fill the prescription.  Call office ([phone]) at any time if any questions, worsening pain, fevers/chills, bleeding, drainage from incision site, or other concerns.  AMBULATORY SURGERY  DISCHARGE INSTRUCTIONS   The drugs that you were given will stay in your system until tomorrow so for the next 24 hours you should not:  Drive an automobile Make any legal decisions Drink any alcoholic beverage   You may resume regular meals tomorrow.  Today it is better to start with liquids and gradually work up to solid foods.  You may eat anything you prefer, but it is better to start with liquids, then soup and crackers, and gradually work up to solid foods.   Please notify your doctor immediately if you have any unusual bleeding, trouble breathing, redness and pain at the surgery site, drainage, fever, or pain not relieved by medication.    Additional Instructions:  Please contact your physician with any problems or Same Day Surgery at [phone], Monday through Friday 6 am to 4 pm, or [facility] number at [phone].  

## 2023-01-31 NOTE — Anesthesia Postprocedure Evaluation (Signed)
Anesthesia Post Note  Patient: Jennifer Gregory  Procedure(s) Performed: INSERTION PORT-A-CATH (Right: Chest)  Patient location during evaluation: PACU Anesthesia Type: General Level of consciousness: awake and alert, oriented and patient cooperative Pain management: pain level controlled Vital Signs Assessment: post-procedure vital signs reviewed and stable Respiratory status: spontaneous breathing, nonlabored ventilation and respiratory function stable Cardiovascular status: blood pressure returned to baseline and stable Postop Assessment: adequate PO intake Anesthetic complications: no   No notable events documented.   Last Vitals:  Vitals:   01/31/23 0845 01/31/23 0850  BP: (!) 100/59 105/65  Pulse: 64 65  Resp: (!) 22 20  Temp:  (!) 36.4 C  SpO2: 97% 93%    Last Pain:  Vitals:   01/31/23 0850  TempSrc:   PainSc: 3                  Reed Breech

## 2023-01-31 NOTE — H&P (Signed)
PATIENT PROFILE: Jennifer Gregory is a 77 y.o. female who presents to the Clinic for consultation at the request of Jennifer Gregory for evaluation of breast cancer.  PCP: Jennifer Bergamo, MD  HISTORY OF PRESENT ILLNESS: [name] reports she palpated a mass of the left breast. Due to the size of the mass this led to diagnostic mammogram. Bilateral diagnostic mammogram and breast ultrasound showed a 3.5 centimeter mass at the 3 o'clock position, oval-shaped, highly suspicious of malignancy. In the right breast she was found with an intraductal mass measuring 0.7 cm at the 12 o'clock position. 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 FISH in process. 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  Family history of breast cancer: Denies Family history of other cancers: Sister with stomach cancer Patient denies any previous breast biopsy  PROBLEM LIST: Problem List Date Reviewed: 01/04/2022   Noted  Chronic anticoagulation 03/20/2022  Dilated aortic root (CMS-HCC) 01/04/2022  BMI 50.0-59.9, adult (CMS/HHS-HCC) (Chronic) 04/17/2017  Fitting or adjustment of cardiac pacemaker 08/29/2016  Medical non-compliance 04/12/2016  Trouble walking 08/28/2014  GERD (gastroesophageal reflux disease) 08/27/2014  Restrictive lung disease (Chronic) 06/02/2014  Encounter for screening mammogram for malignant neoplasm of breast 06/02/2014  (HFpEF) heart failure with preserved ejection fraction (CMS/HHS-HCC) (Chronic) 03/03/2014  Neurogenic bladder 03/03/2014  Atypical atrial flutter (CMS/HHS-HCC) 09/24/2013  Subclinical hyperthyroidism 08/15/2013  Persistent atrial fibrillation (CMS/HHS-HCC) 08/06/2013  Cardiac pacemaker in situ (Chronic) 12/30/2012  Overview  St. Jude Assurity [id number] dual-chamber pacemaker, implanted 03/28/2016, with St. Jude [id number] atrial and ventricular leads, both implanted 06/26/2008.  St Jude  dual-chamber pacemaker, Assurity MRI DDDR [id number] S/N: [id number] RA Lead, SJM Tendril SDX [id number] S/N: [id number] Implant Date: 06/26/2008  RV Lead, SJM Tendril SDX [id number] S/N: [id number] Implant Date: 06/26/2008   Pacemakers Explanted SJM CRM Zephyr XL DR [id number] S/N: [id number] Original Implant Date: 06/26/2008  Cybersecurity firmware upgrade is available.   Sick sinus syndrome (CMS/HHS-HCC) 12/30/2012  Obesity (Chronic) 09/05/2011  Bruise of muscle 09/05/2011  Diabetes mellitus, type 2 (CMS/HHS-HCC) (Chronic) 06/02/2011  Essential hypertension 06/02/2011  CAD (coronary artery disease) 06/02/2011  Obstructive sleep apnea (Chronic) 06/02/2011  Vitamin D deficiency (Chronic) 12/07/2010  Generalized anxiety disorder 12/07/2010   GENERAL REVIEW OF SYSTEMS:   General ROS: negative for - chills, fatigue, fever, weight gain or weight loss Allergy and Immunology ROS: negative for - hives  Hematological and Lymphatic ROS: negative for - bleeding problems or bruising, negative for palpable nodes Endocrine ROS: negative for - heat or cold intolerance, hair changes Respiratory ROS: negative for - cough, shortness of breath or wheezing Cardiovascular ROS: no chest pain or palpitations GI ROS: negative for nausea, vomiting, abdominal pain, diarrhea, constipation Musculoskeletal ROS: negative for - joint swelling or muscle pain Neurological ROS: negative for - confusion, syncope Dermatological ROS: negative for pruritus and rash Psychiatric: negative for anxiety, depression, difficulty sleeping and memory loss  MEDICATIONS: Current Outpatient Medications  Medication Sig Dispense Refill  albuterol (PROVENTIL) 2.5 mg /3 mL (0.083 %) nebulizer solution Take 2.5 mg by nebulization as needed  citalopram (CELEXA) 10 MG tablet Take 1 tablet (10 mg total) by mouth once daily 90 tablet 3  incontinence pad, liner, disp (POISE PANTILINERS) Pads Use one pad as directed 240 each 11  metoprolol succinate (TOPROL-XL) 25  MG XL tablet TAKE 1/2 TABLET BY MOUTH ONCE DAILY 45 tablet 3  nitroGLYcerin (NITROSTAT) 0.4 MG SL tablet Place 1 tablet (  0.4 mg total) under the tongue every 5 (five) minutes as needed X3. If not improvement call EMS. 25 tablet 4  potassium chloride (KLOR-CON) 10 MEQ ER tablet TAKE 3 TABLETS BY MOUTH TWICE DAILY. 540 tablet 3  TORsemide (DEMADEX) 20 MG tablet Take 2 tablets (40 mg total) by mouth once daily 180 tablet 3  XARELTO 20 mg tablet TAKE (1) TABLET BY MOUTH EVERY DAY 90 tablet 3   No current facility-administered medications for this visit.   ALLERGIES: Arb-angiotensin receptor antagonist, Aspirin, Ciprofloxacin, Codeine, Morphine, Niacin, Percocet [oxycodone-acetaminophen], Statins-hmg-coa reductase inhibitors, Sulfa (sulfonamide antibiotics), Tikosyn [dofetilide], Valium [diazepam], Vicodin [hydrocodone-acetaminophen], Sotalol, Pacerone [amiodarone], Septra [sulfamethoxazole-trimethoprim], and Vitamin b6 [pyridoxine]  PAST MEDICAL HISTORY: Past Medical History:  Diagnosis Date  Atrial fibrillation (CMS/HHS-HCC)  Cardiac pacemaker in situ 12/30/2012  Chronic anticoagulation 03/20/2022  DIABETES MELLITUS  Fitting or adjustment of cardiac pacemaker 08/29/2016  GERD (gastroesophageal reflux disease)  Heart abnormality (HHS-HCC)  Hypertension  Hyperthyroidism  Medical non-compliance 04/12/2016  Persistent atrial fibrillation (CMS/HHS-HCC) 08/06/2013  Sick sinus syndrome (CMS/HHS-HCC) 12/30/2012   PAST SURGICAL HISTORY: Past Surgical History:  Procedure Laterality Date  INSERT / REPLACE / REMOVE PACEMAKER 03/28/2016  generator replacement  CARDIAC PACEMAKER PLACEMENT  CESAREAN SECTION  CHOLECYSTECTOMY  HYSTERECTOMY  TUBAL LIGATION    FAMILY HISTORY: Family History  Problem Relation Name Age of Onset  High blood pressure (Hypertension) Mother  Thyroid disease Mother  Diabetes type II Son  Thyroid disease Sister    SOCIAL HISTORY: Social History   Socioeconomic  History  Marital status: Divorced  Tobacco Use  Smoking status: Never  Smokeless tobacco: Never  Vaping Use  Vaping status: Never Used  Substance and Sexual Activity  Alcohol use: No  Alcohol/week: 0.0 standard drinks of alcohol  Drug use: No  Sexual activity: Not Currently  Partners: Male  Social History Narrative  Single. Children here and in [place]. Retired Engineer, civil (consulting). Grandson lives with her.   09/06/2022  Likes/Enjoys/What fills your day?:  Military Service: None  Driving Status: Reports driving, independently  Home: One Story  Your Bedrooms is on: First Level  Fewest Steps to enter the home: Ramp with railings  Other persons in the home: son  Pets: dog  Medical equipment you use daily: Cane, Single point and Rollator  Medical equipment available in the home: Blood Pressure Machine and Glucometer  Audiology/Hearing: Hearing loss bilaterally. Last screening Date: April > 5 years ago. Screening is: In need,  Dermatology: [facility]. Last screening Date: April > 5 years ago Screening is: In need,  Dental: Poor dentition. Last screening Date: April > 5 years ago. Screening is: In need,  Vision: Wears prescription glasses. Last screening Date: April 2021. Screening is: In need, Provider Name: [facility]    Social Determinants of Health   Financial Resource Strain: Low Risk (09/06/2022)  Overall Financial Resource Strain (CARDIA)  Difficulty of Paying Living Expenses: Not hard at all  Food Insecurity: Unknown (09/06/2022)  Hunger Vital Sign  Worried About Running Out of Food in the Last Year: Never true  Transportation Needs: Unknown (09/06/2022)  PRAPARE - Risk analyst (Medical): No   PHYSICAL EXAM: Vitals:  01/18/23 1331  BP: 130/71  Pulse: 62   Body mass index is 48.63 kg/m. Weight: (!) 124.5 kg (274 lb 7.6 oz)   GENERAL: Alert, active, oriented x3  HEENT: Pupils equal reactive to light. Extraocular movements are intact. Sclera clear. Palpebral  conjunctiva normal red color.Pharynx clear.  NECK: Supple with no palpable mass and  no adenopathy.  LUNGS: Sound clear with no rales rhonchi or wheezes.  HEART: Regular rhythm S1 and S2 without murmur.  BREAST: Palpable mass of the left breast. Large bruise on the right breast. No axillary adenopathy. No nipple discharge. No skin involvement.  ABDOMEN: Soft and depressible, nontender with no palpable mass, no hepatomegaly.  EXTREMITIES: Well-developed well-nourished symmetrical with no dependent edema.  NEUROLOGICAL: Awake alert oriented, facial expression symmetrical, moving all extremities.  REVIEW OF DATA: I have reviewed the following data today: Office Visit on 12/18/2022  Component Date Value  Vent Rate (bpm) 12/18/2022 90  QRS Interval (msec) 12/18/2022 88  QT Interval (msec) 12/18/2022 380  QTc (msec) 12/18/2022 464    ASSESSMENT: Ms. Pacyna is a 77 y.o. female presenting for consultation for left breast cancer and right breast intraductal papilloma with atypia.   Patient was oriented again about the pathology results. Surgical alternatives were discussed with patient including partial vs total mastectomy. Surgical technique and post operative care was discussed with patient. Risk of surgery was discussed with patient including but not limited to: wound infection, seroma, hematoma, brachial plexopathy, mondor's disease (thrombosis of small veins of breast), chronic wound pain, breast lymphedema, altered sensation to the nipple and cosmesis among others.   Due to the concern of possible triple negative, patient will meet with medical oncology tomorrow. Decision will be regarding neoadjuvant chemotherapy versus upfront surgery. Patient with large mass. If chemotherapy will be needed, I think the patient will benefit of neoadjuvant chemotherapy to decrease the size of the mass and being able to perform smaller partial mastectomy. Patient will also benefit of axillary sentinel lymph  node biopsy  Patient evaluated by medical oncology. It was recommended to proceed with neoadjuvant chemotherapy. Patient today endorses she understand port placement procedure and its risks.   Malignant neoplasm of upper-outer quadrant of left breast in female, estrogen receptor negative (CMS/HHS-HCC) [C50.412, Z17.1]  PLAN: 1. Insertion of port a cath  Patient verbalized understanding, all questions were answered, and were agreeable with the plan outlined above.   Carolan Shiver, MD

## 2023-01-31 NOTE — Anesthesia Procedure Notes (Signed)
Procedure Name: Intubation Date/Time: 01/31/2023 7:36 AM  Performed by: Cheral Bay, CRNAPre-anesthesia Checklist: Patient identified, Emergency Drugs available, Suction available and Patient being monitored Patient Re-evaluated:Patient Re-evaluated prior to induction Oxygen Delivery Method: Circle system utilized Preoxygenation: Pre-oxygenation with 100% oxygen Induction Type: IV induction and Rapid sequence Laryngoscope Size: McGraph and 3 Grade View: Grade I Tube type: Oral Tube size: 7.0 mm Number of attempts: 1 Airway Equipment and Method: Stylet Placement Confirmation: ETT inserted through vocal cords under direct vision, positive ETCO2 and breath sounds checked- equal and bilateral Secured at: 21 cm Tube secured with: Tape Dental Injury: Teeth and Oropharynx as per pre-operative assessment

## 2023-02-01 ENCOUNTER — Encounter: Payer: Self-pay | Admitting: General Surgery

## 2023-02-01 MED FILL — Dexamethasone Sodium Phosphate Inj 100 MG/10ML: INTRAMUSCULAR | Qty: 1 | Status: AC

## 2023-02-02 ENCOUNTER — Inpatient Hospital Stay: Payer: Medicare HMO

## 2023-02-02 ENCOUNTER — Encounter: Payer: Self-pay | Admitting: Oncology

## 2023-02-02 ENCOUNTER — Other Ambulatory Visit: Payer: Self-pay

## 2023-02-02 ENCOUNTER — Other Ambulatory Visit: Payer: Self-pay | Admitting: Oncology

## 2023-02-02 ENCOUNTER — Inpatient Hospital Stay: Payer: Medicare HMO | Admitting: Oncology

## 2023-02-02 VITALS — BP 138/82 | HR 67 | Temp 97.8°F | Resp 16

## 2023-02-02 VITALS — BP 97/53 | HR 75 | Temp 97.6°F | Resp 18 | Wt 281.0 lb

## 2023-02-02 DIAGNOSIS — Z171 Estrogen receptor negative status [ER-]: Secondary | ICD-10-CM

## 2023-02-02 DIAGNOSIS — Z5112 Encounter for antineoplastic immunotherapy: Secondary | ICD-10-CM

## 2023-02-02 DIAGNOSIS — Z5111 Encounter for antineoplastic chemotherapy: Secondary | ICD-10-CM | POA: Diagnosis not present

## 2023-02-02 DIAGNOSIS — C50412 Malignant neoplasm of upper-outer quadrant of left female breast: Secondary | ICD-10-CM

## 2023-02-02 DIAGNOSIS — Z79899 Other long term (current) drug therapy: Secondary | ICD-10-CM | POA: Diagnosis not present

## 2023-02-02 LAB — CBC WITH DIFFERENTIAL (CANCER CENTER ONLY)
Abs Immature Granulocytes: 0.02 10*3/uL (ref 0.00–0.07)
Basophils Absolute: 0.1 10*3/uL (ref 0.0–0.1)
Basophils Relative: 1 %
Eosinophils Absolute: 0.1 10*3/uL (ref 0.0–0.5)
Eosinophils Relative: 1 %
HCT: 40.3 % (ref 36.0–46.0)
Hemoglobin: 12.2 g/dL (ref 12.0–15.0)
Immature Granulocytes: 0 %
Lymphocytes Relative: 32 %
Lymphs Abs: 2 10*3/uL (ref 0.7–4.0)
MCH: 27.6 pg (ref 26.0–34.0)
MCHC: 30.3 g/dL (ref 30.0–36.0)
MCV: 91.2 fL (ref 80.0–100.0)
Monocytes Absolute: 0.5 10*3/uL (ref 0.1–1.0)
Monocytes Relative: 9 %
Neutro Abs: 3.5 10*3/uL (ref 1.7–7.7)
Neutrophils Relative %: 57 %
Platelet Count: 183 10*3/uL (ref 150–400)
RBC: 4.42 MIL/uL (ref 3.87–5.11)
RDW: 14.3 % (ref 11.5–15.5)
WBC Count: 6.2 10*3/uL (ref 4.0–10.5)
nRBC: 0 % (ref 0.0–0.2)

## 2023-02-02 LAB — CMP (CANCER CENTER ONLY)
ALT: 19 U/L (ref 0–44)
AST: 35 U/L (ref 15–41)
Albumin: 3.3 g/dL — ABNORMAL LOW (ref 3.5–5.0)
Alkaline Phosphatase: 79 U/L (ref 38–126)
Anion gap: 8 (ref 5–15)
BUN: 29 mg/dL — ABNORMAL HIGH (ref 8–23)
CO2: 29 mmol/L (ref 22–32)
Calcium: 8.6 mg/dL — ABNORMAL LOW (ref 8.9–10.3)
Chloride: 100 mmol/L (ref 98–111)
Creatinine: 1.25 mg/dL — ABNORMAL HIGH (ref 0.44–1.00)
GFR, Estimated: 44 mL/min — ABNORMAL LOW (ref >60)
Glucose, Bld: 185 mg/dL — ABNORMAL HIGH (ref 70–99)
Potassium: 4 mmol/L (ref 3.5–5.1)
Sodium: 137 mmol/L (ref 135–145)
Total Bilirubin: 1.1 mg/dL (ref 0.3–1.2)
Total Protein: 7.9 g/dL (ref 6.5–8.1)

## 2023-02-02 LAB — TSH: TSH: 3.025 u[IU]/mL (ref 0.350–4.500)

## 2023-02-02 MED ORDER — SODIUM CHLORIDE 0.9 % IV SOLN
400.0000 mg | Freq: Once | INTRAVENOUS | Status: AC
  Administered 2023-02-02: 400 mg via INTRAVENOUS
  Filled 2023-02-02: qty 16

## 2023-02-02 MED ORDER — FAMOTIDINE IN NACL 20-0.9 MG/50ML-% IV SOLN
20.0000 mg | Freq: Once | INTRAVENOUS | Status: AC
  Administered 2023-02-02: 20 mg via INTRAVENOUS
  Filled 2023-02-02: qty 50

## 2023-02-02 MED ORDER — DIPHENHYDRAMINE HCL 50 MG/ML IJ SOLN
50.0000 mg | Freq: Once | INTRAMUSCULAR | Status: AC
  Administered 2023-02-02: 50 mg via INTRAVENOUS
  Filled 2023-02-02: qty 1

## 2023-02-02 MED ORDER — SODIUM CHLORIDE 0.9 % IV SOLN
Freq: Once | INTRAVENOUS | Status: AC
  Filled 2023-02-02: qty 250

## 2023-02-02 MED ORDER — SODIUM CHLORIDE 0.9 % IV SOLN
10.0000 mg | Freq: Once | INTRAVENOUS | Status: AC
  Administered 2023-02-02: 10 mg via INTRAVENOUS
  Filled 2023-02-02: qty 10

## 2023-02-02 MED ORDER — SODIUM CHLORIDE 0.9 % IV SOLN
162.6000 mg | Freq: Once | INTRAVENOUS | Status: AC
  Administered 2023-02-02: 160 mg via INTRAVENOUS
  Filled 2023-02-02: qty 16

## 2023-02-02 MED ORDER — SODIUM CHLORIDE 0.9 % IV SOLN
80.0000 mg/m2 | Freq: Once | INTRAVENOUS | Status: AC
  Administered 2023-02-02: 192 mg via INTRAVENOUS
  Filled 2023-02-02: qty 32

## 2023-02-02 MED ORDER — PALONOSETRON HCL INJECTION 0.25 MG/5ML
0.2500 mg | Freq: Once | INTRAVENOUS | Status: AC
  Administered 2023-02-02: 0.25 mg via INTRAVENOUS
  Filled 2023-02-02: qty 5

## 2023-02-02 MED ORDER — HEPARIN SOD (PORK) LOCK FLUSH 100 UNIT/ML IV SOLN
500.0000 [IU] | Freq: Once | INTRAVENOUS | Status: AC | PRN
  Administered 2023-02-02: 500 [IU]
  Filled 2023-02-02: qty 5

## 2023-02-02 NOTE — Patient Instructions (Signed)
[facility]  Discharge Instructions: Thank you for choosing [facility] to provide your oncology and hematology care.  If you have a lab appointment with the Cancer Center, please go directly to the Cancer Center and check in at the registration area.  Wear comfortable clothing and clothing appropriate for easy access to any Portacath or PICC line.   We strive to give you quality time with your provider. You may need to reschedule your appointment if you arrive late (15 or more minutes).  Arriving late affects you and other patients whose appointments are after yours.  Also, if you miss three or more appointments without notifying the office, you may be dismissed from the clinic at the provider's discretion.      For prescription refill requests, have your pharmacy contact our office and allow 72 hours for refills to be completed.    Today you received the following chemotherapy and/or immunotherapy agents- Keytruda, Taxol, Carboplatin      To help prevent nausea and vomiting after your treatment, we encourage you to take your nausea medication as directed.  BELOW ARE SYMPTOMS THAT SHOULD BE REPORTED IMMEDIATELY: *FEVER GREATER THAN 100.4 F (38 C) OR HIGHER *CHILLS OR SWEATING *NAUSEA AND VOMITING THAT IS NOT CONTROLLED WITH YOUR NAUSEA MEDICATION *UNUSUAL SHORTNESS OF BREATH *UNUSUAL BRUISING OR BLEEDING *URINARY PROBLEMS (pain or burning when urinating, or frequent urination) *BOWEL PROBLEMS (unusual diarrhea, constipation, pain near the anus) TENDERNESS IN MOUTH AND THROAT WITH OR WITHOUT PRESENCE OF ULCERS (sore throat, sores in mouth, or a toothache) UNUSUAL RASH, SWELLING OR PAIN  UNUSUAL VAGINAL DISCHARGE OR ITCHING   Items with * indicate a potential emergency and should be followed up as soon as possible or go to the Emergency Department if any problems should occur.  Please show the CHEMOTHERAPY ALERT CARD or IMMUNOTHERAPY ALERT  CARD at check-in to the Emergency Department and triage nurse.  Should you have questions after your visit or need to cancel or reschedule your appointment, please contact [facility]  [phone] and follow the prompts.  Office hours are 8:00 a.m. to 4:30 p.m. Monday - Friday. Please note that voicemails left after 4:00 p.m. may not be returned until the following business day.  We are closed weekends and major holidays. You have access to a nurse at all times for urgent questions. Please call the main number to the clinic [phone] and follow the prompts.  For any non-urgent questions, you may also contact your provider using MyChart. We now offer e-Visits for anyone 19 and older to request care online for non-urgent symptoms. For details visit [email or web address].   Also download the MyChart app! Go to the app store, search "MyChart", open the app, select [place], and log in with your MyChart username and password.

## 2023-02-02 NOTE — Progress Notes (Signed)
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  This was followed by a diagnostic mammogram and ultrasound.  Mammogram showed 3.4 x 1.8 x 3.5 cm mass at the 3 o'clock position of the left breast.  There was also an intraductal mass located at the 12 o'clock position of the right breast.  No axillary adenopathy was noted.  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  Right breast mass showed fragments of epithelial proliferation with atypia differential diagnosis includes intraductal papilloma with at least atypical ductal hyperplasia.   Patient has a history of heart failure with.  Served ejection fraction and atrial fibrillation for which she follows up with Dr. Darin Engels from cardiology.  Plan is to proceed with 12 weeks of weekly CarboTaxol chemotherapy along with Keytruda.  Anthracycline will not be given  Interval history-patient was supposed to get MRI bilateral breasts at [facility] as she has a pacemaker.  Order was faxed but patient has not yet scheduled the MRI at [facility] so far.  Denies any specific complaints today  ECOG PS- 2 Pain scale- 0   Review of systems- Review of Systems  Constitutional:  Negative for chills, fever, malaise/fatigue and weight loss.  HENT:  Negative for congestion, ear  discharge and nosebleeds.   Eyes:  Negative for blurred vision.  Respiratory:  Negative for cough, hemoptysis, sputum production, shortness of breath and wheezing.   Cardiovascular:  Negative for chest pain, palpitations, orthopnea and claudication.  Gastrointestinal:  Negative for abdominal pain, blood in stool, constipation, diarrhea, heartburn, melena, nausea and vomiting.  Genitourinary:  Negative for dysuria, flank pain, frequency, hematuria and urgency.  Musculoskeletal:  Negative for back pain, joint pain and myalgias.  Skin:  Negative for rash.  Neurological:  Negative for dizziness, tingling, focal weakness, seizures, weakness and headaches.  Endo/Heme/Allergies:  Does not bruise/bleed easily.  Psychiatric/Behavioral:  Negative for depression and suicidal ideas. The patient does not have insomnia.       Allergies  Allergen Reactions   Aspirin Other (See Comments)    Bleeding risk since patient is on xarelta   Ciprofloxacin Other (See Comments)    Other Reaction: RASH ITCHING    Codeine Other (See Comments)   Diazepam Other (See Comments) and Nausea And Vomiting   Dofetilide Other (See Comments)    Other Reaction: arrhythmia    Hydrocodone-Acetaminophen Other (See Comments) and Nausea And Vomiting   Morphine Nausea And Vomiting   Niacin Other (See Comments)    fatigue   Oxycodone-Acetaminophen Other (See Comments) and Nausea And Vomiting   Sotalol Other (See Comments)    Other Reaction: Prolonged QTc    Amiodarone Other (See Comments)    Affecting liver    Pyridoxine Other (See Comments)    Splitting headache   Sulfamethoxazole-Trimethoprim Swelling   Other Rash    EKG sticker electrodes  extended time     Past Medical History:  Diagnosis Date   Atrial fibrillation [facility])    Atrial fibrillation with RVR (HCC)    Breast cancer, left (HCC) 11/2022   CHF (congestive heart failure) (HCC)    Complication of anesthesia    respiratory depression; needs to sit up  immediately after   Coronary artery disease    Edema of both lower extremities    GERD (gastroesophageal reflux disease)    Hypertension, essential    Hyperthyroidism    Morbid obesity with BMI of 45.0-49.9, adult (HCC)    Pneumonia    Pneumonia due to COVID-19 virus    Sick sinus syndrome (HCC)    Sleep apnea    Type 2 diabetes mellitus without complication (HCC)    Vitamin D deficiency      Past Surgical History:  Procedure Laterality Date   ABSCESS DRAINAGE     BREAST BIOPSY Left 01/15/2023   [place] Bx Left Ribbon - Path pending   breast biopsy Right 01/15/2023   [place] Bx Right Coil Clip - Path Pending   BREAST BIOPSY Right 01/15/2023   [place] RT BREAST BX W LOC DEV 1ST LESION IMG BX SPEC US GUIDE 01/15/2023 [facility]   BREAST BIOPSY Left 01/15/2023   [place] LT BREAST BX W LOC DEV 1ST LESION IMG BX SPEC US GUIDE 01/15/2023 [facility]   CARDIAC CATHETERIZATION     CARDIAC PACEMAKER PLACEMENT  06/26/2008   St. Jude dual chamber   CESAREAN SECTION     x 3   CHOLECYSTECTOMY     PACEMAKER GENERATOR CHANGE  03/28/2016   PORTACATH PLACEMENT Right 01/31/2023   Procedure: INSERTION PORT-A-CATH;  Surgeon: Carolan Shiver, MD;  Location: [facility] ORS;  Service: General;  Laterality: Right;   TOTAL ABDOMINAL HYSTERECTOMY     TUBAL LIGATION      Social History   Socioeconomic History   Marital status: Single    Spouse name: Not on file   Number of children: 3   Years of education: Not on file   Highest education level: Not on file  Occupational History   Not on file  Tobacco Use   Smoking status: Never   Smokeless tobacco: Never  Vaping Use   Vaping status: Never Used  Substance and Sexual Activity   Alcohol use: Not Currently   Drug use: Never   Sexual activity: Not Currently  Other Topics Concern   Not on file  Social History Narrative   Not on file   Social Determinants of Health   Financial Resource Strain: Low Risk  (09/06/2022)   Received from [facility]   Overall Financial Resource Strain (CARDIA)    Difficulty of Paying Living Expenses: Not hard at all  Food Insecurity: No Food Insecurity (01/19/2023)   Hunger Vital Sign    Worried About Running Out of Food in the Last Year: Never true    Ran Out of Food in the Last Year: Never true  Transportation Needs: No Transportation Needs (01/19/2023)   PRAPARE - Administrator, Civil Service (Medical): No    Lack of Transportation (Non-Medical): No  Physical Activity: Not on file  Stress: Not on file  Social Connections: Not on file  Intimate Partner Violence: Not At Risk (01/19/2023)   Humiliation, Afraid, Rape, and Kick questionnaire    Fear of Current or Ex-Partner: No    Emotionally Abused: No    Physically Abused: No    Sexually Abused: No  Family History  Problem Relation Age of Onset   Mesothelioma Father      Current Outpatient Medications:    citalopram (CELEXA) 10 MG tablet, Take 10 mg by mouth daily., Disp: , Rfl:    dexamethasone (DECADRON) 4 MG tablet, Take 2 tablets daily for 2 days, start the day after chemotherapy. Take with food., Disp: 30 tablet, Rfl: 1   lidocaine-prilocaine (EMLA) cream, Apply to affected area once, Disp: 30 g, Rfl: 3   metoprolol succinate (TOPROL-XL) 25 MG 24 hr tablet, Take 12.5 mg by mouth daily., Disp: , Rfl:    Multiple Vitamins-Minerals (CENTRUM SILVER 50+WOMEN PO), Take 1 tablet by mouth daily., Disp: , Rfl:    nitroGLYCERIN (NITROSTAT) 0.4 MG SL tablet, Place 0.4 mg under the tongue every 5 (five) minutes as needed for chest pain., Disp: , Rfl:    ondansetron (ZOFRAN) 4 MG tablet, Take 1 tablet (4 mg total) by mouth daily as needed for nausea or vomiting., Disp: 30 tablet, Rfl: 1   ondansetron (ZOFRAN) 8 MG tablet, Take 1 tablet (8 mg total) by mouth every 8 (eight) hours as needed for nausea or vomiting. Start on the third day after chemotherapy., Disp: 30 tablet, Rfl: 1   potassium chloride (KLOR-CON) 10  MEQ tablet, Take 30 mEq by mouth 2 (two) times daily. Am and 4 pm, Disp: , Rfl:    prochlorperazine (COMPAZINE) 10 MG tablet, Take 1 tablet (10 mg total) by mouth every 6 (six) hours as needed for nausea or vomiting., Disp: 30 tablet, Rfl: 1   rivaroxaban (XARELTO) 20 MG TABS tablet, Take 20 mg by mouth every evening., Disp: , Rfl:    torsemide (DEMADEX) 20 MG tablet, Take 40 mg by mouth daily in the afternoon. 4 pm, Disp: , Rfl:    traMADol (ULTRAM) 50 MG tablet, Take 1 tablet (50 mg total) by mouth every 6 (six) hours as needed for up to 10 days., Disp: 20 tablet, Rfl: 0 No current facility-administered medications for this visit.  Facility-Administered Medications Ordered in Other Visits:    CARBOplatin (PARAPLATIN) 160 mg in sodium chloride 0.9 % 100 mL chemo infusion, 160 mg, Intravenous, Once, Creig Hines, MD   PACLitaxel (TAXOL) 192 mg in sodium chloride 0.9 % 250 mL chemo infusion (</= 80mg /m2), 80 mg/m2 (Treatment Plan Recorded), Intravenous, Once, Creig Hines, MD, Last Rate: 63 mL/hr at 02/02/23 1249, Rate Change at 02/02/23 1249  Physical exam:  Vitals:   02/02/23 1018  BP: (!) 97/53  Pulse: 75  Resp: 18  Temp: 97.6 F (36.4 C)  SpO2: 95%  Weight: 281 lb (127.5 kg)   Physical Exam Cardiovascular:     Rate and Rhythm: Normal rate and regular rhythm.     Heart sounds: Normal heart sounds.  Pulmonary:     Effort: Pulmonary effort is normal.     Breath sounds: Normal breath sounds.  Abdominal:     General: Bowel sounds are normal.     Palpations: Abdomen is soft.  Skin:    General: Skin is warm and dry.  Neurological:     Mental Status: She is alert and oriented to person, place, and time.         Latest Ref Rng & Units 02/02/2023   10:03 AM  CMP  Glucose 70 - 99 mg/dL 244   BUN 8 - 23 mg/dL 29   Creatinine 0.10 - 1.00 mg/dL 2.72   Sodium 536 - 644 mmol/L 137   Potassium 3.5 - 5.1  mmol/L 4.0   Chloride 98 - 111 mmol/L 100   CO2 22 - 32 mmol/L 29   Calcium  8.9 - 10.3 mg/dL 8.6   Total Protein 6.5 - 8.1 g/dL 7.9   Total Bilirubin 0.3 - 1.2 mg/dL 1.1   Alkaline Phos 38 - 126 U/L 79   AST 15 - 41 U/L 35   ALT 0 - 44 U/L 19       Latest Ref Rng & Units 02/02/2023   10:03 AM  CBC  WBC 4.0 - 10.5 K/uL 6.2   Hemoglobin 12.0 - 15.0 g/dL 09.8   Hematocrit 11.9 - 46.0 % 40.3   Platelets 150 - 400 K/uL 183     No images are attached to the encounter.  DG Chest Port 1 View  Result Date: 01/31/2023 CLINICAL DATA:  [id number] Port-A-Cath in place [id number] EXAM: PORTABLE CHEST 1 VIEW.  Patient is slightly rotated. COMPARISON:  Chest x-ray 01/17/2022, PET CT 01/30/2023 FINDINGS: Interval placement of a right chest wall Port-A-Cath with tip overlying the expected region of the superior cavoatrial junction. Left chest wall dual lead pacemaker in similar position. Enlarged cardiac silhouette. The heart and mediastinal contours are unchanged. Left lower lobe atelectasis. No focal consolidation. No pulmonary edema. No pleural effusion. No pneumothorax. No acute osseous abnormality. IMPRESSION: 1. Interval placement of a right chest wall Port-A-Cath with tip overlying the expected region of the superior cavoatrial junction. 2. No active disease. Electronically Signed   By: Tish Frederickson M.D.   On: 01/31/2023 08:47   DG C-Arm 1-60 Min-No Report  Result Date: 01/31/2023 Fluoroscopy was utilized by the requesting physician.  No radiographic interpretation.   [place] RT BREAST BX W LOC DEV 1ST LESION IMG BX SPEC US GUIDE  Addendum Date: 01/16/2023   ADDENDUM REPORT: 01/16/2023 11:05 ADDENDUM: PATHOLOGY revealed: Site 1. Breast, LEFT, needle core biopsy, 3 o'clock, 8 cm fn, ribbon clip - INVASIVE MAMMARY CARCINOMA WITH FOCAL SQUAMOUS DIFFERENTIATION, SEE NOTE - TUBULE FORMATION: SCORE 3 - NUCLEAR PLEOMORPHISM: SCORE 3 - MITOTIC COUNT: SCORE 2 - TOTAL SCORE: 8 - OVERALL GRADE: 3 - LYMPHOVASCULAR INVASION: NOT IDENTIFIED - CANCER LENGTH: 0.9 CM CALCIFICATIONS: NOT IDENTIFIED  Pathology results are CONCORDANT with imaging findings, per Dr. Frederico Hamman. PATHOLOGY revealed: Site 2. Breast, RIGHT, needle core biopsy, 12 o'clock, 3 cm fn, coil clip - DETACHED FRAGMENTS OF EPITHELIAL PROLIFERATION WITH ATYPIA Diagnosis note 2. The differential diagnosis includes an intraductal papilloma with at least atypical ductal hyperplasia (ADH). Clinical correlation recommended. Pathology results are CONCORDANT with imaging findings, per Dr. Frederico Hamman with surgical consultation for excision. Pathology results and recommendations below were discussed with patient by telephone on 01/16/2023. Patient reported biopsy site with slight tenderness at the site. Post biopsy care instructions were reviewed, questions were answered and my direct phone number was provided to patient. Patient was instructed to call the [facility] if any concerns or questions arise related to the biopsy. RECOMMENDATIONS: 1. Surgical and oncological consultation. Request for surgical and oncological consultation relayed to Irving Shows RN at [facility] by Lynett Grimes, RN on 01/16/2023 via Epic in-basket message. Pathology results reported by Lynett Grimes, RN on 01/16/2023. Electronically Signed   By: Frederico Hamman M.D.   On: 01/16/2023 11:05   Result Date: 01/16/2023 CLINICAL DATA:  77 year old female presenting for ultrasound-guided biopsy of a right and left breast mass. EXAM: ULTRASOUND GUIDED BILATERAL BREAST CORE NEEDLE BIOPSY COMPARISON:  Previous exam(s). PROCEDURE: I met with  the patient and we discussed the procedure of ultrasound-guided biopsy, including benefits and alternatives. We discussed the high likelihood of a successful procedure. We discussed the risks of the procedure, including infection, bleeding, tissue injury, clip migration, and inadequate sampling. Informed written consent was given. The usual time-out protocol was performed immediately prior to the  procedure. Lesion quadrant: Upper outer quadrant Using sterile technique and 1% Lidocaine as local anesthetic, under direct ultrasound visualization, a 14 gauge spring-loaded device was used to perform biopsy of a left breast mass at 3 o'clock, 8 cm from the nipple using a superior approach. At the conclusion of the procedure a ribbon shaped tissue marker clip was deployed into the biopsy cavity. Using sterile technique and 1% Lidocaine as local anesthetic, under direct ultrasound visualization, a 14 gauge spring-loaded device was used to perform biopsy of a retroareolar right breast mass at 12 o'clock 3 cm from the nipple using a lateral approach. At the conclusion of the procedure a coil shaped tissue marker clip was deployed into the biopsy cavity. Follow up 2 view mammogram was performed and dictated separately. IMPRESSION: 1. Ultrasound guided biopsy of a left breast mass at 3 o'clock (ribbon clip). No apparent complications. 2. Ultrasound-guided biopsy of a right breast mass at 12 o'clock (coil clip). No apparent complications. Electronically Signed: By: Frederico Hamman M.D. On: 01/15/2023 09:11   [place] LT BREAST BX W LOC DEV 1ST LESION IMG BX SPEC US GUIDE  Addendum Date: 01/16/2023   ADDENDUM REPORT: 01/16/2023 11:05 ADDENDUM: PATHOLOGY revealed: Site 1. Breast, LEFT, needle core biopsy, 3 o'clock, 8 cm fn, ribbon clip - INVASIVE MAMMARY CARCINOMA WITH FOCAL SQUAMOUS DIFFERENTIATION, SEE NOTE - TUBULE FORMATION: SCORE 3 - NUCLEAR PLEOMORPHISM: SCORE 3 - MITOTIC COUNT: SCORE 2 - TOTAL SCORE: 8 - OVERALL GRADE: 3 - LYMPHOVASCULAR INVASION: NOT IDENTIFIED - CANCER LENGTH: 0.9 CM CALCIFICATIONS: NOT IDENTIFIED Pathology results are CONCORDANT with imaging findings, per Dr. Frederico Hamman. PATHOLOGY revealed: Site 2. Breast, RIGHT, needle core biopsy, 12 o'clock, 3 cm fn, coil clip - DETACHED FRAGMENTS OF EPITHELIAL PROLIFERATION WITH ATYPIA Diagnosis note 2. The differential diagnosis includes an  intraductal papilloma with at least atypical ductal hyperplasia (ADH). Clinical correlation recommended. Pathology results are CONCORDANT with imaging findings, per Dr. Frederico Hamman with surgical consultation for excision. Pathology results and recommendations below were discussed with patient by telephone on 01/16/2023. Patient reported biopsy site with slight tenderness at the site. Post biopsy care instructions were reviewed, questions were answered and my direct phone number was provided to patient. Patient was instructed to call the [facility] if any concerns or questions arise related to the biopsy. RECOMMENDATIONS: 1. Surgical and oncological consultation. Request for surgical and oncological consultation relayed to Irving Shows RN at [facility] by Lynett Grimes, RN on 01/16/2023 via Epic in-basket message. Pathology results reported by Lynett Grimes, RN on 01/16/2023. Electronically Signed   By: Frederico Hamman M.D.   On: 01/16/2023 11:05   Result Date: 01/16/2023 CLINICAL DATA:  77 year old female presenting for ultrasound-guided biopsy of a right and left breast mass. EXAM: ULTRASOUND GUIDED BILATERAL BREAST CORE NEEDLE BIOPSY COMPARISON:  Previous exam(s). PROCEDURE: I met with the patient and we discussed the procedure of ultrasound-guided biopsy, including benefits and alternatives. We discussed the high likelihood of a successful procedure. We discussed the risks of the procedure, including infection, bleeding, tissue injury, clip migration, and inadequate sampling. Informed written consent was given. The usual time-out protocol was performed immediately  prior to the procedure. Lesion quadrant: Upper outer quadrant Using sterile technique and 1% Lidocaine as local anesthetic, under direct ultrasound visualization, a 14 gauge spring-loaded device was used to perform biopsy of a left breast mass at 3 o'clock, 8 cm from the nipple using a superior approach. At  the conclusion of the procedure a ribbon shaped tissue marker clip was deployed into the biopsy cavity. Using sterile technique and 1% Lidocaine as local anesthetic, under direct ultrasound visualization, a 14 gauge spring-loaded device was used to perform biopsy of a retroareolar right breast mass at 12 o'clock 3 cm from the nipple using a lateral approach. At the conclusion of the procedure a coil shaped tissue marker clip was deployed into the biopsy cavity. Follow up 2 view mammogram was performed and dictated separately. IMPRESSION: 1. Ultrasound guided biopsy of a left breast mass at 3 o'clock (ribbon clip). No apparent complications. 2. Ultrasound-guided biopsy of a right breast mass at 12 o'clock (coil clip). No apparent complications. Electronically Signed: By: Frederico Hamman M.D. On: 01/15/2023 09:11   MM CLIP PLACEMENT RIGHT  Result Date: 01/15/2023 CLINICAL DATA:  Post biopsy mammogram of the bilateral breasts for clip placement. EXAM: 3D DIAGNOSTIC BILATERAL MAMMOGRAM POST ULTRASOUND BIOPSY COMPARISON:  Previous exam(s). FINDINGS: 3D Mammographic images were obtained following ultrasound guided biopsy of the bilateral breasts. The biopsy marking clips are in expected position at the sites of biopsy. IMPRESSION: 1. Appropriate positioning of the ribbon shaped biopsy marking clip at the site of biopsy in the lateral left breast. 2. Appropriate positioning of the coil shaped biopsy marking clip in the retroareolar right breast. Final Assessment: Post Procedure Mammograms for Marker Placement Electronically Signed   By: Frederico Hamman M.D.   On: 01/15/2023 09:34   MM CLIP PLACEMENT LEFT  Result Date: 01/15/2023 CLINICAL DATA:  Post biopsy mammogram of the bilateral breasts for clip placement. EXAM: 3D DIAGNOSTIC BILATERAL MAMMOGRAM POST ULTRASOUND BIOPSY COMPARISON:  Previous exam(s). FINDINGS: 3D Mammographic images were obtained following ultrasound guided biopsy of the bilateral breasts.  The biopsy marking clips are in expected position at the sites of biopsy. IMPRESSION: 1. Appropriate positioning of the ribbon shaped biopsy marking clip at the site of biopsy in the lateral left breast. 2. Appropriate positioning of the coil shaped biopsy marking clip in the retroareolar right breast. Final Assessment: Post Procedure Mammograms for Marker Placement Electronically Signed   By: Frederico Hamman M.D.   On: 01/15/2023 09:34   MM 3D DIAGNOSTIC MAMMOGRAM BILATERAL BREAST  Result Date: 01/04/2023 CLINICAL DATA:  Palpable mass in the LEFT breast for 4 weeks. EXAM: DIGITAL DIAGNOSTIC BILATERAL MAMMOGRAM WITH TOMOSYNTHESIS AND CAD; ULTRASOUND LEFT BREAST LIMITED; ULTRASOUND RIGHT BREAST LIMITED TECHNIQUE: Bilateral digital diagnostic mammography and breast tomosynthesis was performed. The images were evaluated with computer-aided detection. ; Targeted ultrasound examination of the left breast was performed.; Targeted ultrasound examination of the right breast was performed COMPARISON:  Prior studies from [facility] dated 07/16/2014, 07/13/2014 ACR Breast Density Category d: The breasts are extremely dense, which lowers the sensitivity of mammography. FINDINGS: RIGHT BREAST: Mammogram: Possible mass in the central retroareolar region of the RIGHT breast is further evaluated with spot compression views. These views demonstrate dense fibroglandular tissue without definitive. Mammographic images were processed with CAD. Physical Exam: I palpate no abnormality in the 12 o'clock location of the RIGHT breast. Ultrasound: Targeted ultrasound is performed, showing no sonographic correlate for the questioned mass in the 12 o'clock location. Incidental note is made of an intraductal  solid mass in the 12 o'clock location 1 centimeter from the nipple. There is internal blood flow by Doppler evaluation. Mass measures 0.7 x 0.3 x 0.4 centimeters. LEFT BREAST: Mammogram: There is asymmetric density in the UPPER-OUTER  QUADRANT of the LEFT breast, corresponding to the area of patient's concern. Spot tangential view confirms a focal asymmetry with associated distortion in the LATERAL portion of the breast. Mammographic images were processed with CAD. Physical Exam: I palpate a discrete mobile firm mass in the UPPER-OUTER QUADRANT of the LEFT breast. Ultrasound: Targeted ultrasound is performed, showing an oval mass with indistinct and angular margins in the 3 o'clock location of the LEFT breast 8 centimeters from the nipple. There is internal blood flow by Doppler evaluation. Mass is 3.4 x 1.8 x 3.5 centimeters. Evaluation of the LEFT axilla is negative for adenopathy. IMPRESSION: 1. Intraductal mass in the 12 o'clock location of the RIGHT breast for which biopsy is recommended. 2. Palpable solid 3.5 centimeter mass in the 3 o'clock location of the LEFT breast, suspicious for malignancy. 3. No axillary adenopathy. RECOMMENDATION: 1. Ultrasound-guided core biopsy of intraductal mass in the 12 o'clock location of the RIGHT breast. 2. Ultrasound-guided core biopsy of mass in the 3 o'clock location of the LEFT breast. I have discussed the findings and recommendations with the patient. If applicable, a reminder letter will be sent to the patient regarding the next appointment. BI-RADS CATEGORY  5: Highly suggestive of malignancy. Electronically Signed   By: Norva Pavlov M.D.   On: 01/04/2023 15:57   [place] LIMITED ULTRASOUND INCLUDING AXILLA LEFT BREAST   Result Date: 01/04/2023 CLINICAL DATA:  Palpable mass in the LEFT breast for 4 weeks. EXAM: DIGITAL DIAGNOSTIC BILATERAL MAMMOGRAM WITH TOMOSYNTHESIS AND CAD; ULTRASOUND LEFT BREAST LIMITED; ULTRASOUND RIGHT BREAST LIMITED TECHNIQUE: Bilateral digital diagnostic mammography and breast tomosynthesis was performed. The images were evaluated with computer-aided detection. ; Targeted ultrasound examination of the left breast was performed.; Targeted ultrasound examination of the right  breast was performed COMPARISON:  Prior studies from [facility] dated 07/16/2014, 07/13/2014 ACR Breast Density Category d: The breasts are extremely dense, which lowers the sensitivity of mammography. FINDINGS: RIGHT BREAST: Mammogram: Possible mass in the central retroareolar region of the RIGHT breast is further evaluated with spot compression views. These views demonstrate dense fibroglandular tissue without definitive. Mammographic images were processed with CAD. Physical Exam: I palpate no abnormality in the 12 o'clock location of the RIGHT breast. Ultrasound: Targeted ultrasound is performed, showing no sonographic correlate for the questioned mass in the 12 o'clock location. Incidental note is made of an intraductal solid mass in the 12 o'clock location 1 centimeter from the nipple. There is internal blood flow by Doppler evaluation. Mass measures 0.7 x 0.3 x 0.4 centimeters. LEFT BREAST: Mammogram: There is asymmetric density in the UPPER-OUTER QUADRANT of the LEFT breast, corresponding to the area of patient's concern. Spot tangential view confirms a focal asymmetry with associated distortion in the LATERAL portion of the breast. Mammographic images were processed with CAD. Physical Exam: I palpate a discrete mobile firm mass in the UPPER-OUTER QUADRANT of the LEFT breast. Ultrasound: Targeted ultrasound is performed, showing an oval mass with indistinct and angular margins in the 3 o'clock location of the LEFT breast 8 centimeters from the nipple. There is internal blood flow by Doppler evaluation. Mass is 3.4 x 1.8 x 3.5 centimeters. Evaluation of the LEFT axilla is negative for adenopathy. IMPRESSION: 1. Intraductal mass in the 12 o'clock location of the RIGHT breast  for which biopsy is recommended. 2. Palpable solid 3.5 centimeter mass in the 3 o'clock location of the LEFT breast, suspicious for malignancy. 3. No axillary adenopathy. RECOMMENDATION: 1. Ultrasound-guided core biopsy of intraductal mass in the  12 o'clock location of the RIGHT breast. 2. Ultrasound-guided core biopsy of mass in the 3 o'clock location of the LEFT breast. I have discussed the findings and recommendations with the patient. If applicable, a reminder letter will be sent to the patient regarding the next appointment. BI-RADS CATEGORY  5: Highly suggestive of malignancy. Electronically Signed   By: Norva Pavlov M.D.   On: 01/04/2023 15:57   [place] LIMITED ULTRASOUND INCLUDING AXILLA RIGHT BREAST  Result Date: 01/04/2023 CLINICAL DATA:  Palpable mass in the LEFT breast for 4 weeks. EXAM: DIGITAL DIAGNOSTIC BILATERAL MAMMOGRAM WITH TOMOSYNTHESIS AND CAD; ULTRASOUND LEFT BREAST LIMITED; ULTRASOUND RIGHT BREAST LIMITED TECHNIQUE: Bilateral digital diagnostic mammography and breast tomosynthesis was performed. The images were evaluated with computer-aided detection. ; Targeted ultrasound examination of the left breast was performed.; Targeted ultrasound examination of the right breast was performed COMPARISON:  Prior studies from [facility] dated 07/16/2014, 07/13/2014 ACR Breast Density Category d: The breasts are extremely dense, which lowers the sensitivity of mammography. FINDINGS: RIGHT BREAST: Mammogram: Possible mass in the central retroareolar region of the RIGHT breast is further evaluated with spot compression views. These views demonstrate dense fibroglandular tissue without definitive. Mammographic images were processed with CAD. Physical Exam: I palpate no abnormality in the 12 o'clock location of the RIGHT breast. Ultrasound: Targeted ultrasound is performed, showing no sonographic correlate for the questioned mass in the 12 o'clock location. Incidental note is made of an intraductal solid mass in the 12 o'clock location 1 centimeter from the nipple. There is internal blood flow by Doppler evaluation. Mass measures 0.7 x 0.3 x 0.4 centimeters. LEFT BREAST: Mammogram: There is asymmetric density in the UPPER-OUTER QUADRANT of the LEFT  breast, corresponding to the area of patient's concern. Spot tangential view confirms a focal asymmetry with associated distortion in the LATERAL portion of the breast. Mammographic images were processed with CAD. Physical Exam: I palpate a discrete mobile firm mass in the UPPER-OUTER QUADRANT of the LEFT breast. Ultrasound: Targeted ultrasound is performed, showing an oval mass with indistinct and angular margins in the 3 o'clock location of the LEFT breast 8 centimeters from the nipple. There is internal blood flow by Doppler evaluation. Mass is 3.4 x 1.8 x 3.5 centimeters. Evaluation of the LEFT axilla is negative for adenopathy. IMPRESSION: 1. Intraductal mass in the 12 o'clock location of the RIGHT breast for which biopsy is recommended. 2. Palpable solid 3.5 centimeter mass in the 3 o'clock location of the LEFT breast, suspicious for malignancy. 3. No axillary adenopathy. RECOMMENDATION: 1. Ultrasound-guided core biopsy of intraductal mass in the 12 o'clock location of the RIGHT breast. 2. Ultrasound-guided core biopsy of mass in the 3 o'clock location of the LEFT breast. I have discussed the findings and recommendations with the patient. If applicable, a reminder letter will be sent to the patient regarding the next appointment. BI-RADS CATEGORY  5: Highly suggestive of malignancy. Electronically Signed   By: Norva Pavlov M.D.   On: 01/04/2023 15:57   MM Outside Films Mammo  Result Date: 01/03/2023 This examination belongs to an outside facility and is stored here for comparison purposes only.  Contact the originating outside institution for any associated report or interpretation.    Assessment and plan- Patient is a 77 y.o. 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  She is here for on treatment assessment prior to cycle 1 of neoadjuvant CarboTaxol Keytruda chemotherapy  Counts okay to proceed with cycle 1 of neoadjuvant CarboTaxol Keytruda  chemotherapy today.  She will directly proceed for CarboTaxol hemotherapy next week and I will see her back in 2 weeks for cycle 3 of weekly CarboTaxol chemotherapy.  Discussed risks of chemotherapy again including all but not limited to nausea, vomiting, low blood counts, risk of infections and hospitalization as well as peripheral neuropathy.  Patient understands and agrees to proceed as planned.  Have reviewed PET CT scan images independently and discussed findings with the patient.  Official reports are not back but I do not see any evidence of distant metastatic disease on the PET scan.  I have encouraged the patient to proceed with bilateral breast MRI at [facility] as soon as possible as we would like to know the extent of disease before she is too deep into neoadjuvant chemotherapy.  Patient states that she will call and schedule that ASAP.   Visit Diagnosis 1. Encounter for antineoplastic chemotherapy   2. Malignant neoplasm of upper-outer quadrant of left breast in female, estrogen receptor negative (HCC)   3. Encounter for antineoplastic immunotherapy      Dr. Owens Shark, MD, MPH [facility] at [facility] [phone] 02/02/2023 12:55 PM

## 2023-02-03 ENCOUNTER — Other Ambulatory Visit: Payer: Self-pay

## 2023-02-03 LAB — T4: T4, Total: 7.4 ug/dL (ref 4.5–12.0)

## 2023-02-05 ENCOUNTER — Telehealth: Payer: Self-pay

## 2023-02-05 NOTE — Telephone Encounter (Signed)
Telephone call to patient for follow up after receiving first infusion.   Patient states infusion went great but had too much Benadryl and made her sleepy.  States eating good and drinking plenty of fluids.   Denies any nausea or vomiting.  Encouraged patient to call for any concerns or questions.

## 2023-02-06 ENCOUNTER — Inpatient Hospital Stay: Payer: Medicare HMO

## 2023-02-06 ENCOUNTER — Encounter: Payer: Self-pay | Admitting: *Deleted

## 2023-02-06 ENCOUNTER — Ambulatory Visit: Payer: Medicare HMO | Admitting: Radiation Oncology

## 2023-02-06 ENCOUNTER — Inpatient Hospital Stay: Payer: Medicare HMO | Admitting: Licensed Clinical Social Worker

## 2023-02-06 ENCOUNTER — Encounter: Payer: Self-pay | Admitting: Oncology

## 2023-02-06 NOTE — Progress Notes (Deleted)
REFERRING PROVIDER: Creig Hines, MD [address] [place],  [place] [address]  PRIMARY PROVIDER:  Rayetta Humphrey, MD  PRIMARY REASON FOR VISIT:  1. Malignant neoplasm of upper-outer quadrant of left breast in female, estrogen receptor negative (HCC)   2. Genetic testing      HISTORY OF PRESENT ILLNESS:   Jennifer Gregory, a 77 y.o. female, was seen for a [place] cancer genetics consultation at the request of Dr. [name] due to a personal history of triple negative breast cancer.  Jennifer Gregory presents to clinic today to discuss the possibility of a hereditary predisposition to cancer, genetic testing, and to further clarify her future cancer risks, as well as potential cancer risks for family members.   In 2024, at the age of 62, Jennifer Gregory was diagnosed with invasive mammary carcinoma of the left breast, triple negative. The treatment plan includes neoadjuvant chemotherapy, surgery, adjuvant radiation. 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  CANCER HISTORY:  Oncology History  Malignant neoplasm of upper-outer quadrant of left breast in female, estrogen receptor negative (HCC)  01/19/2023 Initial Diagnosis   Malignant neoplasm of upper-outer quadrant of left breast in female, estrogen receptor negative (HCC)   01/19/2023 Cancer Staging   Staging form: Breast, AJCC 8th Edition - Clinical stage from 01/19/2023: Stage IIB (cT2, cN0, cM0, G3, ER-, PR-, HER2-) - Signed by Creig Hines, MD on 01/19/2023 Stage prefix: Initial diagnosis Histologic grading system: 3 grade system   02/02/2023 -  Chemotherapy   Patient is on Treatment Plan : BREAST Pembrolizumab (200) D1 + Carboplatin (1.5) weekly + Paclitaxel (80) weekly      Genetic Testing   Negative genetic testing. No pathogenic variants identified on the [facility] Common Hereditary Cancers+RNA panel. The report date is 01/27/2023.  The Common Hereditary Cancers Panel + RNA offered  by [facility] includes sequencing and/or deletion duplication testing of the following 48 genes: APC*, ATM*, AXIN2, BAP1, BARD1, BMPR1A, BRCA1, BRCA2, BRIP1, CDH1, CDK4, CDKN2A (p14ARF), CDKN2A (p16INK4a), CHEK2, CTNNA1, DICER1*, EPCAM*, FH*, GREM1*, HOXB13, KIT, MBD4, MEN1*, MLH1*, MSH2*, MSH3*, MSH6*, MUTYH, NF1*, NTHL1, PALB2, PDGFRA, PMS2*, POLD1*, POLE, PTEN*, RAD51C, RAD51D, SDHA*, SDHB, SDHC*, SDHD, SMAD4, SMARCA4, STK11, TP53, TSC1*, TSC2, VHL.      RISK FACTORS:  Menarche was at age ***.  First live birth at age ***.  Ovaries intact: {Yes/No-Ex:120004}.  Hysterectomy: yes.  Menopausal status: postmenopausal.  Colonoscopy: yes; {normal/abnormal/not examined:14677}. Mammogram within the last year: yes.  Past Medical History:  Diagnosis Date   Atrial fibrillation [facility])    Atrial fibrillation with RVR (HCC)    Breast cancer, left (HCC) 11/2022   CHF (congestive heart failure) (HCC)    Complication of anesthesia    respiratory depression; needs to sit up immediately after   Coronary artery disease    Edema of both lower extremities    GERD (gastroesophageal reflux disease)    Hypertension, essential    Hyperthyroidism    Morbid obesity with BMI of 45.0-49.9, adult (HCC)    Pneumonia    Pneumonia due to COVID-19 virus    Sick sinus syndrome (HCC)    Sleep apnea    Type 2 diabetes mellitus without complication (HCC)    Vitamin D deficiency     Past Surgical History:  Procedure Laterality Date   ABSCESS DRAINAGE     BREAST BIOPSY Left 01/15/2023   [place] Bx Left Ribbon - Path pending  breast biopsy Right 01/15/2023   [place] Bx Right Coil Clip - Path Pending   BREAST BIOPSY Right 01/15/2023   [place] RT BREAST BX W LOC DEV 1ST LESION IMG BX SPEC US GUIDE 01/15/2023 [facility]   BREAST BIOPSY Left 01/15/2023   [place] LT BREAST BX W LOC DEV 1ST LESION IMG BX SPEC US GUIDE 01/15/2023 [facility]   CARDIAC CATHETERIZATION     CARDIAC PACEMAKER PLACEMENT  06/26/2008   St[employer] Jude dual  chamber   CESAREAN SECTION     x 3   CHOLECYSTECTOMY     PACEMAKER GENERATOR CHANGE  03/28/2016   PORTACATH PLACEMENT Right 01/31/2023   Procedure: INSERTION PORT-A-CATH;  Surgeon: Carolan Shiver, MD;  Location: [facility] ORS;  Service: General;  Laterality: Right;   TOTAL ABDOMINAL HYSTERECTOMY     TUBAL LIGATION      FAMILY HISTORY:  We obtained a detailed, 4-generation family history.  Significant diagnoses are listed below: Family History  Problem Relation Age of Onset   Mesothelioma Father     Jennifer Gregory is unaware of previous family history of genetic testing for hereditary cancer risks. There is no reported Ashkenazi Jewish ancestry. There is no known consanguinity.  GENETIC COUNSELING ASSESSMENT: Jennifer Gregory is a 77 y.o. female with a personal history of triple negative breast cancer which is somewhat suggestive of a hereditary cancer syndrome and predisposition to cancer. We, therefore, discussed and recommended the following at today's visit.   DISCUSSION: We discussed that approximately 10% of breast cancer is hereditary. Most cases of hereditary breast cancer are associated with BRCA1/BRCA2 genes, although there are other genes associated with hereditary cancer as well. Cancers and risks are gene specific. We discussed that testing is beneficial for several reasons including knowing about cancer risks, identifying potential screening and risk-reduction options that may be appropriate, and to understand if other family members could be at risk for cancer and allow them to undergo genetic testing.   We reviewed the characteristics, features and inheritance patterns of hereditary cancer syndromes. We also discussed genetic testing, including the appropriate family members to test, the process of testing, insurance coverage and turn-around-time for results. We discussed the implications of a negative, positive and/or variant of uncertain significant result. We recommended Jennifer Gregory  pursue genetic testing for the [facility] Common Hereditary Cancers+RNA gene panel. She had blood drawn for this at her initial oncology appointment.  GENETIC TEST RESULTS:  The [facility] Common Hereditary Cancers+RNA Panel found no pathogenic mutations.   The Common Hereditary Cancers Panel + RNA offered by [facility] includes sequencing and/or deletion duplication testing of the following 48 genes: APC*, ATM*, AXIN2, BAP1, BARD1, BMPR1A, BRCA1, BRCA2, BRIP1, CDH1, CDK4, CDKN2A (p14ARF), CDKN2A (p16INK4a), CHEK2, CTNNA1, DICER1*, EPCAM*, FH*, GREM1*, HOXB13, KIT, MBD4, MEN1*, MLH1*, MSH2*, MSH3*, MSH6*, MUTYH, NF1*, NTHL1, PALB2, PDGFRA, PMS2*, POLD1*, POLE, PTEN*, RAD51C, RAD51D, SDHA*, SDHB, SDHC*, SDHD, SMAD4, SMARCA4, STK11, TP53, TSC1*, TSC2, VHL.   The test report has been scanned into EPIC and is located under the Molecular Pathology section of the Results Review tab.  A portion of the result report is included below for reference. Genetic testing reported out on 01/27/2023.    Even though a pathogenic variant was not identified, possible explanations for the cancer in the family may include: There may be no hereditary risk for cancer in the family. The cancers in Jennifer Gregory and/or her family may be sporadic/familial or due to other genetic and environmental factors. There may be a gene mutation in one of  these genes that current testing methods cannot detect but that chance is small. There could be another gene that has not yet been discovered, or that we have not yet tested, that is responsible for the cancer diagnoses in the family.  It is also possible there is a hereditary cause for the cancer in the family that Jennifer Gregory did not inherit.  Therefore, it is important to remain in touch with cancer genetics in the future so that we can continue to offer Jennifer Gregory the most up to date genetic testing.   ADDITIONAL GENETIC TESTING:  We discussed with Jennifer Gregory that her genetic testing was fairly  extensive.  If there are additional relevant genes identified to increase cancer risk that can be analyzed in the future, we would be happy to discuss and coordinate this testing at that time.   CANCER SCREENING RECOMMENDATIONS:  Jennifer Gregory test result is considered negative (normal).  This means that we have not identified a hereditary cause for her personal history of cancer at this time.   An individual's cancer risk and medical management are not determined by genetic test results alone. Overall cancer risk assessment incorporates additional factors, including personal medical history, family history, and any available genetic information that may result in a personalized plan for cancer prevention and surveillance. Therefore, it is recommended she continue to follow the cancer management and screening guidelines provided by her oncology and primary healthcare provider.  RECOMMENDATIONS FOR FAMILY MEMBERS:   Since she did not inherit a identifiable mutation in a cancer predisposition gene included on this panel, her children could not have inherited a known mutation from her in one of these genes. Individuals in this family might be at some increased risk of developing cancer, over the general population risk, due to the family history of cancer.  Individuals in the family should notify their providers of the family history of cancer. We recommend women in this family have a yearly mammogram beginning at age 90, or 77 years younger than the earliest onset of cancer, an annual clinical breast exam, and perform monthly breast self-exams.  Family members should have colonoscopies by at age 19, or earlier, as recommended by their providers.  FOLLOW-UP:  Lastly, we discussed with Jennifer Gregory that cancer genetics is a rapidly advancing field and it is possible that new genetic tests will be appropriate for her and/or her family members in the future. We encouraged her to remain in contact with cancer  genetics on an annual basis so we can update her personal and family histories and let her know of advances in cancer genetics that may benefit this family.   Our contact number was provided. Jennifer Gregory questions were answered to her satisfaction, and she knows she is welcome to call us at anytime with additional questions or concerns.    Lacy Duverney, MS, [facility] Genetic Counselor [email or web address] Phone: [phone]  The patient was seen for a total of *** minutes in face-to-face genetic counseling.  Dr. Blake Divine was available for discussion regarding this case.   _______________________________________________________________________ For Office Staff:  Number of people involved in session: *** Was an Intern/ student involved with case: no

## 2023-02-07 ENCOUNTER — Telehealth: Payer: Self-pay | Admitting: *Deleted

## 2023-02-07 NOTE — Telephone Encounter (Signed)
Patient called reporting that she had diarrhea for 2 days and now has bright red rectal bleeding and wanted to know what to do. I advised that with the time of day, and rectal bleeding that she needs to go to the ER , not Urgent care but the ER. She agreed with plan

## 2023-02-08 ENCOUNTER — Other Ambulatory Visit: Payer: Self-pay

## 2023-02-08 ENCOUNTER — Inpatient Hospital Stay (HOSPITAL_BASED_OUTPATIENT_CLINIC_OR_DEPARTMENT_OTHER): Payer: Medicare HMO | Admitting: Hospice and Palliative Medicine

## 2023-02-08 ENCOUNTER — Encounter: Payer: Self-pay | Admitting: Oncology

## 2023-02-08 ENCOUNTER — Inpatient Hospital Stay: Payer: Medicare HMO

## 2023-02-08 ENCOUNTER — Encounter: Payer: Self-pay | Admitting: Hospice and Palliative Medicine

## 2023-02-08 ENCOUNTER — Other Ambulatory Visit: Payer: Self-pay | Admitting: *Deleted

## 2023-02-08 ENCOUNTER — Encounter: Payer: Self-pay | Admitting: *Deleted

## 2023-02-08 VITALS — BP 99/62 | HR 84 | Temp 98.9°F | Resp 18 | Ht 63.0 in | Wt 279.0 lb

## 2023-02-08 DIAGNOSIS — Z95828 Presence of other vascular implants and grafts: Secondary | ICD-10-CM

## 2023-02-08 DIAGNOSIS — R197 Diarrhea, unspecified: Secondary | ICD-10-CM

## 2023-02-08 DIAGNOSIS — C50412 Malignant neoplasm of upper-outer quadrant of left female breast: Secondary | ICD-10-CM

## 2023-02-08 DIAGNOSIS — C50919 Malignant neoplasm of unspecified site of unspecified female breast: Secondary | ICD-10-CM

## 2023-02-08 DIAGNOSIS — K625 Hemorrhage of anus and rectum: Secondary | ICD-10-CM

## 2023-02-08 DIAGNOSIS — R17 Unspecified jaundice: Secondary | ICD-10-CM

## 2023-02-08 DIAGNOSIS — Z5111 Encounter for antineoplastic chemotherapy: Secondary | ICD-10-CM | POA: Diagnosis not present

## 2023-02-08 DIAGNOSIS — K921 Melena: Secondary | ICD-10-CM

## 2023-02-08 LAB — CMP (CANCER CENTER ONLY)
ALT: 22 U/L (ref 0–44)
AST: 27 U/L (ref 15–41)
Albumin: 3.2 g/dL — ABNORMAL LOW (ref 3.5–5.0)
Alkaline Phosphatase: 69 U/L (ref 38–126)
Anion gap: 9 (ref 5–15)
BUN: 24 mg/dL — ABNORMAL HIGH (ref 8–23)
CO2: 27 mmol/L (ref 22–32)
Calcium: 8.8 mg/dL — ABNORMAL LOW (ref 8.9–10.3)
Chloride: 100 mmol/L (ref 98–111)
Creatinine: 0.97 mg/dL (ref 0.44–1.00)
GFR, Estimated: 60 mL/min — ABNORMAL LOW (ref >60)
Glucose, Bld: 176 mg/dL — ABNORMAL HIGH (ref 70–99)
Potassium: 4.1 mmol/L (ref 3.5–5.1)
Sodium: 136 mmol/L (ref 135–145)
Total Bilirubin: 3 mg/dL — ABNORMAL HIGH (ref 0.3–1.2)
Total Protein: 7.5 g/dL (ref 6.5–8.1)

## 2023-02-08 LAB — CBC WITH DIFFERENTIAL (CANCER CENTER ONLY)
Abs Immature Granulocytes: 0.04 10*3/uL (ref 0.00–0.07)
Basophils Absolute: 0.1 10*3/uL (ref 0.0–0.1)
Basophils Relative: 1 %
Eosinophils Absolute: 0.2 10*3/uL (ref 0.0–0.5)
Eosinophils Relative: 4 %
HCT: 37.9 % (ref 36.0–46.0)
Hemoglobin: 11.6 g/dL — ABNORMAL LOW (ref 12.0–15.0)
Immature Granulocytes: 1 %
Lymphocytes Relative: 26 %
Lymphs Abs: 1.3 10*3/uL (ref 0.7–4.0)
MCH: 27.2 pg (ref 26.0–34.0)
MCHC: 30.6 g/dL (ref 30.0–36.0)
MCV: 89 fL (ref 80.0–100.0)
Monocytes Absolute: 0.1 10*3/uL (ref 0.1–1.0)
Monocytes Relative: 1 %
Neutro Abs: 3.3 10*3/uL (ref 1.7–7.7)
Neutrophils Relative %: 67 %
Platelet Count: 168 10*3/uL (ref 150–400)
RBC: 4.26 MIL/uL (ref 3.87–5.11)
RDW: 13.7 % (ref 11.5–15.5)
WBC Count: 4.9 10*3/uL (ref 4.0–10.5)
nRBC: 0 % (ref 0.0–0.2)

## 2023-02-08 LAB — BILIRUBIN, DIRECT: Bilirubin, Direct: 0.4 mg/dL — ABNORMAL HIGH (ref 0.0–0.2)

## 2023-02-08 LAB — MAGNESIUM: Magnesium: 2 mg/dL (ref 1.7–2.4)

## 2023-02-08 MED ORDER — HEPARIN SOD (PORK) LOCK FLUSH 100 UNIT/ML IV SOLN
500.0000 [IU] | Freq: Once | INTRAVENOUS | Status: AC
  Administered 2023-02-08: 500 [IU]
  Filled 2023-02-08: qty 5

## 2023-02-08 MED ORDER — SODIUM CHLORIDE 0.9% FLUSH
10.0000 mL | Freq: Once | INTRAVENOUS | Status: AC
  Administered 2023-02-08: 10 mL via INTRAVENOUS
  Filled 2023-02-08: qty 10

## 2023-02-08 MED FILL — Dexamethasone Sodium Phosphate Inj 100 MG/10ML: INTRAMUSCULAR | Qty: 1 | Status: AC

## 2023-02-08 NOTE — Progress Notes (Signed)
Symptom Management Clinic [facility] at [facility] Telephone:([phone] [phone]) [phone]  Patient Care Team: Rayetta Humphrey, MD as PCP - General (Family Medicine) Hulen Luster, RN as Oncology Nurse Navigator Creig Hines, MD as Consulting Physician (Oncology) Carmina Miller, MD as Consulting Physician (Radiation Oncology)   NAME OF PATIENT: Jennifer Gregory  [id number]  1945/06/12   DATE OF VISIT: 02/08/23  REASON FOR CONSULT: Jennifer Gregory is a 77 y.o. 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   INTERVAL HISTORY: Patient saw Dr. [name] on 02/02/2023 for cycle 1 neoadjuvant CarboTaxol Keytruda chemotherapy.  Patient presents [name] today for evaluation of of rectal bleeding.  Patient says that she developed diarrhea following her chemotherapy.  She otherwise has chronic constipation.  The diarrhea resolved but yesterday patient noted that she had several drops of bright red blood in the toilet after having a bowel movement.  She also noticed several drops of blood this morning.  She denies history of GI bleeds.  Has not seen GI in the past and has never had a colonoscopy.  She does have a history of hemorrhoids which she self manages with OTC medications.  Denies any neurologic complaints. Denies recent fevers or illnesses. Denies any easy bleeding or bruising. Reports fair appetite and denies weight loss. Denies chest pain. Denies urinary complaints. Patient offers no further specific complaints today.   PAST MEDICAL HISTORY: Past Medical History:  Diagnosis Date   Atrial fibrillation [facility])    Atrial fibrillation with RVR (HCC)    Breast cancer, left (HCC) 11/2022   CHF (congestive heart failure) (HCC)    Complication of anesthesia    respiratory depression; needs to sit up immediately after   Coronary artery disease    Edema of both lower extremities    GERD (gastroesophageal reflux disease)     Hypertension, essential    Hyperthyroidism    Morbid obesity with BMI of 45.0-49.9, adult (HCC)    Pneumonia    Pneumonia due to COVID-19 virus    Sick sinus syndrome (HCC)    Sleep apnea    Type 2 diabetes mellitus without complication (HCC)    Vitamin D deficiency     PAST SURGICAL HISTORY:  Past Surgical History:  Procedure Laterality Date   ABSCESS DRAINAGE     BREAST BIOPSY Left 01/15/2023   [place] Bx Left Ribbon - Path pending   breast biopsy Right 01/15/2023   [place] Bx Right Coil Clip - Path Pending   BREAST BIOPSY Right 01/15/2023   [place] RT BREAST BX W LOC DEV 1ST LESION IMG BX SPEC US GUIDE 01/15/2023 [facility]   BREAST BIOPSY Left 01/15/2023   [place] LT BREAST BX W LOC DEV 1ST LESION IMG BX SPEC US GUIDE 01/15/2023 [facility]   CARDIAC CATHETERIZATION     CARDIAC PACEMAKER PLACEMENT  06/26/2008   St. Jude dual chamber   CESAREAN SECTION     x 3   CHOLECYSTECTOMY     PACEMAKER GENERATOR CHANGE  03/28/2016   PORTACATH PLACEMENT Right 01/31/2023   Procedure: INSERTION PORT-A-CATH;  Surgeon: Carolan Shiver, MD;  Location: [facility] ORS;  Service: General;  Laterality: Right;   TOTAL ABDOMINAL HYSTERECTOMY     TUBAL LIGATION      HEMATOLOGY/ONCOLOGY HISTORY:  Oncology History  Malignant neoplasm of upper-outer quadrant of left breast in female, estrogen receptor negative (HCC)  01/19/2023 Initial Diagnosis   Malignant neoplasm of upper-outer quadrant of left breast in female,  estrogen receptor negative (HCC)   01/19/2023 Cancer Staging   Staging form: Breast, AJCC 8th Edition - Clinical stage from 01/19/2023: Stage IIB (cT2, cN0, cM0, G3, ER-, PR-, HER2-) - Signed by Creig Hines, MD on 01/19/2023 Stage prefix: Initial diagnosis Histologic grading system: 3 grade system   02/02/2023 -  Chemotherapy   Patient is on Treatment Plan : BREAST Pembrolizumab (200) D1 + Carboplatin (1.5) weekly + Paclitaxel (80) weekly      Genetic Testing   Negative genetic testing. No  pathogenic variants identified on the [employer] Common Hereditary Cancers+RNA panel. The report date is 01/27/2023.  The Common Hereditary Cancers Panel + RNA offered by [facility] includes sequencing and/or deletion duplication testing of the following 48 genes: APC*, ATM*, AXIN2, BAP1, BARD1, BMPR1A, BRCA1, BRCA2, BRIP1, CDH1, CDK4, CDKN2A (p14ARF), CDKN2A (p16INK4a), CHEK2, CTNNA1, DICER1*, EPCAM*, FH*, GREM1*, HOXB13, KIT, MBD4, MEN1*, MLH1*, MSH2*, MSH3*, MSH6*, MUTYH, NF1*, NTHL1, PALB2, PDGFRA, PMS2*, POLD1*, POLE, PTEN*, RAD51C, RAD51D, SDHA*, SDHB, SDHC*, SDHD, SMAD4, SMARCA4, STK11, TP53, TSC1*, TSC2, VHL.      ALLERGIES:  is allergic to aspirin, ciprofloxacin, codeine, diazepam, dofetilide, hydrocodone-acetaminophen, morphine, niacin, oxycodone-acetaminophen, sotalol, amiodarone, pyridoxine, sulfamethoxazole-trimethoprim, and other.  MEDICATIONS:  Current Outpatient Medications  Medication Sig Dispense Refill   citalopram (CELEXA) 10 MG tablet Take 10 mg by mouth daily.     dexamethasone (DECADRON) 4 MG tablet Take 2 tablets daily for 2 days, start the day after chemotherapy. Take with food. 30 tablet 1   lidocaine-prilocaine (EMLA) cream Apply to affected area once 30 g 3   metoprolol succinate (TOPROL-XL) 25 MG 24 hr tablet Take 12.5 mg by mouth daily.     Multiple Vitamins-Minerals (CENTRUM SILVER 50+WOMEN PO) Take 1 tablet by mouth daily.     nitroGLYCERIN (NITROSTAT) 0.4 MG SL tablet Place 0.4 mg under the tongue every 5 (five) minutes as needed for chest pain.     ondansetron (ZOFRAN) 4 MG tablet Take 1 tablet (4 mg total) by mouth daily as needed for nausea or vomiting. 30 tablet 1   ondansetron (ZOFRAN) 8 MG tablet Take 1 tablet (8 mg total) by mouth every 8 (eight) hours as needed for nausea or vomiting. Start on the third day after chemotherapy. 30 tablet 1   potassium chloride (KLOR-CON) 10 MEQ tablet Take 30 mEq by mouth 2 (two) times daily. Am and 4 pm     prochlorperazine  (COMPAZINE) 10 MG tablet Take 1 tablet (10 mg total) by mouth every 6 (six) hours as needed for nausea or vomiting. 30 tablet 1   rivaroxaban (XARELTO) 20 MG TABS tablet Take 20 mg by mouth every evening.     torsemide (DEMADEX) 20 MG tablet Take 40 mg by mouth daily in the afternoon. 4 pm     traMADol (ULTRAM) 50 MG tablet Take 1 tablet (50 mg total) by mouth every 6 (six) hours as needed for up to 10 days. 20 tablet 0   No current facility-administered medications for this visit.   Facility-Administered Medications Ordered in Other Visits  Medication Dose Route Frequency Provider Last Rate Last Admin   sodium chloride flush (NS) 0.9 % injection 10 mL  10 mL Intravenous Once Dent Plantz, Daryl Eastern, NP        VITAL SIGNS: There were no vitals taken for this visit. There were no vitals filed for this visit.  Estimated body mass index is 49.78 kg/m as calculated from the following:   Height as of 01/31/23: 5\' 3"  (1.6 m).  Weight as of 02/02/23: 281 lb (127.5 kg).  LABS: CBC:    Component Value Date/Time   WBC 6.2 02/02/2023 1003   WBC 6.5 01/31/2023 0634   HGB 12.2 02/02/2023 1003   HGB 13.2 08/03/2011 2143   HCT 40.3 02/02/2023 1003   HCT 41.4 08/03/2011 2143   PLT 183 02/02/2023 1003   PLT 185 08/03/2011 2143   MCV 91.2 02/02/2023 1003   MCV 87 08/03/2011 2143   NEUTROABS 3.5 02/02/2023 1003   LYMPHSABS 2.0 02/02/2023 1003   MONOABS 0.5 02/02/2023 1003   EOSABS 0.1 02/02/2023 1003   BASOSABS 0.1 02/02/2023 1003   Comprehensive Metabolic Panel:    Component Value Date/Time   NA 137 02/02/2023 1003   NA 144 08/03/2011 2143   K 4.0 02/02/2023 1003   K 3.6 08/03/2011 2143   CL 100 02/02/2023 1003   CL 101 08/03/2011 2143   CO2 29 02/02/2023 1003   CO2 33 (H) 08/03/2011 2143   BUN 29 (H) 02/02/2023 1003   BUN 15 08/03/2011 2143   CREATININE 1.25 (H) 02/02/2023 1003   CREATININE 1.10 08/03/2011 2143   GLUCOSE 185 (H) 02/02/2023 1003   GLUCOSE 100 (H) 08/03/2011 2143    CALCIUM 8.6 (L) 02/02/2023 1003   CALCIUM 8.5 08/03/2011 2143   AST 35 02/02/2023 1003   ALT 19 02/02/2023 1003   ALT 45 08/03/2011 2143   ALKPHOS 79 02/02/2023 1003   ALKPHOS 86 08/03/2011 2143   BILITOT 1.1 02/02/2023 1003   PROT 7.9 02/02/2023 1003   PROT 7.6 08/03/2011 2143   ALBUMIN 3.3 (L) 02/02/2023 1003   ALBUMIN 3.1 (L) 08/03/2011 2143    RADIOGRAPHIC STUDIES: DG Chest Port 1 View  Result Date: 01/31/2023 CLINICAL DATA:  [id number] Port-A-Cath in place [id number] EXAM: PORTABLE CHEST 1 VIEW.  Patient is slightly rotated. COMPARISON:  Chest x-ray 01/17/2022, PET CT 01/30/2023 FINDINGS: Interval placement of a right chest wall Port-A-Cath with tip overlying the expected region of the superior cavoatrial junction. Left chest wall dual lead pacemaker in similar position. Enlarged cardiac silhouette. The heart and mediastinal contours are unchanged. Left lower lobe atelectasis. No focal consolidation. No pulmonary edema. No pleural effusion. No pneumothorax. No acute osseous abnormality. IMPRESSION: 1. Interval placement of a right chest wall Port-A-Cath with tip overlying the expected region of the superior cavoatrial junction. 2. No active disease. Electronically Signed   By: Tish Frederickson M.D.   On: 01/31/2023 08:47   DG C-Arm 1-60 Min-No Report  Result Date: 01/31/2023 Fluoroscopy was utilized by the requesting physician.  No radiographic interpretation.   [place] RT BREAST BX W LOC DEV 1ST LESION IMG BX SPEC US GUIDE  Addendum Date: 01/16/2023   ADDENDUM REPORT: 01/16/2023 11:05 ADDENDUM: PATHOLOGY revealed: Site 1. Breast, LEFT, needle core biopsy, 3 o'clock, 8 cm fn, ribbon clip - INVASIVE MAMMARY CARCINOMA WITH FOCAL SQUAMOUS DIFFERENTIATION, SEE NOTE - TUBULE FORMATION: SCORE 3 - NUCLEAR PLEOMORPHISM: SCORE 3 - MITOTIC COUNT: SCORE 2 - TOTAL SCORE: 8 - OVERALL GRADE: 3 - LYMPHOVASCULAR INVASION: NOT IDENTIFIED - CANCER LENGTH: 0.9 CM CALCIFICATIONS: NOT IDENTIFIED Pathology results are  CONCORDANT with imaging findings, per Dr. Frederico Hamman. PATHOLOGY revealed: Site 2. Breast, RIGHT, needle core biopsy, 12 o'clock, 3 cm fn, coil clip - DETACHED FRAGMENTS OF EPITHELIAL PROLIFERATION WITH ATYPIA Diagnosis note 2. The differential diagnosis includes an intraductal papilloma with at least atypical ductal hyperplasia (ADH). Clinical correlation recommended. Pathology results are CONCORDANT with imaging findings, per Dr. Frederico Hamman with surgical  consultation for excision. Pathology results and recommendations below were discussed with patient by telephone on 01/16/2023. Patient reported biopsy site with slight tenderness at the site. Post biopsy care instructions were reviewed, questions were answered and my direct phone number was provided to patient. Patient was instructed to call the Breast Center of [facility] if any concerns or questions arise related to the biopsy. RECOMMENDATIONS: 1. Surgical and oncological consultation. Request for surgical and oncological consultation relayed to Irving Shows RN at [facility] by Lynett Grimes, RN on 01/16/2023 via Epic in-basket message. Pathology results reported by Lynett Grimes, RN on 01/16/2023. Electronically Signed   By: Frederico Hamman M.D.   On: 01/16/2023 11:05   Result Date: 01/16/2023 CLINICAL DATA:  77 year old female presenting for ultrasound-guided biopsy of a right and left breast mass. EXAM: ULTRASOUND GUIDED BILATERAL BREAST CORE NEEDLE BIOPSY COMPARISON:  Previous exam(s). PROCEDURE: I met with the patient and we discussed the procedure of ultrasound-guided biopsy, including benefits and alternatives. We discussed the high likelihood of a successful procedure. We discussed the risks of the procedure, including infection, bleeding, tissue injury, clip migration, and inadequate sampling. Informed written consent was given. The usual time-out protocol was performed immediately prior to the procedure. Lesion  quadrant: Upper outer quadrant Using sterile technique and 1% Lidocaine as local anesthetic, under direct ultrasound visualization, a 14 gauge spring-loaded device was used to perform biopsy of a left breast mass at 3 o'clock, 8 cm from the nipple using a superior approach. At the conclusion of the procedure a ribbon shaped tissue marker clip was deployed into the biopsy cavity. Using sterile technique and 1% Lidocaine as local anesthetic, under direct ultrasound visualization, a 14 gauge spring-loaded device was used to perform biopsy of a retroareolar right breast mass at 12 o'clock 3 cm from the nipple using a lateral approach. At the conclusion of the procedure a coil shaped tissue marker clip was deployed into the biopsy cavity. Follow up 2 view mammogram was performed and dictated separately. IMPRESSION: 1. Ultrasound guided biopsy of a left breast mass at 3 o'clock (ribbon clip). No apparent complications. 2. Ultrasound-guided biopsy of a right breast mass at 12 o'clock (coil clip). No apparent complications. Electronically Signed: By: Frederico Hamman M.D. On: 01/15/2023 09:11   [place] LT BREAST BX W LOC DEV 1ST LESION IMG BX SPEC US GUIDE  Addendum Date: 01/16/2023   ADDENDUM REPORT: 01/16/2023 11:05 ADDENDUM: PATHOLOGY revealed: Site 1. Breast, LEFT, needle core biopsy, 3 o'clock, 8 cm fn, ribbon clip - INVASIVE MAMMARY CARCINOMA WITH FOCAL SQUAMOUS DIFFERENTIATION, SEE NOTE - TUBULE FORMATION: SCORE 3 - NUCLEAR PLEOMORPHISM: SCORE 3 - MITOTIC COUNT: SCORE 2 - TOTAL SCORE: 8 - OVERALL GRADE: 3 - LYMPHOVASCULAR INVASION: NOT IDENTIFIED - CANCER LENGTH: 0.9 CM CALCIFICATIONS: NOT IDENTIFIED Pathology results are CONCORDANT with imaging findings, per Dr. Frederico Hamman. PATHOLOGY revealed: Site 2. Breast, RIGHT, needle core biopsy, 12 o'clock, 3 cm fn, coil clip - DETACHED FRAGMENTS OF EPITHELIAL PROLIFERATION WITH ATYPIA Diagnosis note 2. The differential diagnosis includes an intraductal papilloma with  at least atypical ductal hyperplasia (ADH). Clinical correlation recommended. Pathology results are CONCORDANT with imaging findings, per Dr. Frederico Hamman with surgical consultation for excision. Pathology results and recommendations below were discussed with patient by telephone on 01/16/2023. Patient reported biopsy site with slight tenderness at the site. Post biopsy care instructions were reviewed, questions were answered and my direct phone number was provided to patient. Patient was instructed to call the Breast Center  of [facility] if any concerns or questions arise related to the biopsy. RECOMMENDATIONS: 1. Surgical and oncological consultation. Request for surgical and oncological consultation relayed to Irving Shows RN at [facility] by Lynett Grimes, RN on 01/16/2023 via Epic in-basket message. Pathology results reported by Lynett Grimes, RN on 01/16/2023. Electronically Signed   By: Frederico Hamman M.D.   On: 01/16/2023 11:05   Result Date: 01/16/2023 CLINICAL DATA:  77 year old female presenting for ultrasound-guided biopsy of a right and left breast mass. EXAM: ULTRASOUND GUIDED BILATERAL BREAST CORE NEEDLE BIOPSY COMPARISON:  Previous exam(s). PROCEDURE: I met with the patient and we discussed the procedure of ultrasound-guided biopsy, including benefits and alternatives. We discussed the high likelihood of a successful procedure. We discussed the risks of the procedure, including infection, bleeding, tissue injury, clip migration, and inadequate sampling. Informed written consent was given. The usual time-out protocol was performed immediately prior to the procedure. Lesion quadrant: Upper outer quadrant Using sterile technique and 1% Lidocaine as local anesthetic, under direct ultrasound visualization, a 14 gauge spring-loaded device was used to perform biopsy of a left breast mass at 3 o'clock, 8 cm from the nipple using a superior approach. At the conclusion of the  procedure a ribbon shaped tissue marker clip was deployed into the biopsy cavity. Using sterile technique and 1% Lidocaine as local anesthetic, under direct ultrasound visualization, a 14 gauge spring-loaded device was used to perform biopsy of a retroareolar right breast mass at 12 o'clock 3 cm from the nipple using a lateral approach. At the conclusion of the procedure a coil shaped tissue marker clip was deployed into the biopsy cavity. Follow up 2 view mammogram was performed and dictated separately. IMPRESSION: 1. Ultrasound guided biopsy of a left breast mass at 3 o'clock (ribbon clip). No apparent complications. 2. Ultrasound-guided biopsy of a right breast mass at 12 o'clock (coil clip). No apparent complications. Electronically Signed: By: Frederico Hamman M.D. On: 01/15/2023 09:11   MM CLIP PLACEMENT RIGHT  Result Date: 01/15/2023 CLINICAL DATA:  Post biopsy mammogram of the bilateral breasts for clip placement. EXAM: 3D DIAGNOSTIC BILATERAL MAMMOGRAM POST ULTRASOUND BIOPSY COMPARISON:  Previous exam(s). FINDINGS: 3D Mammographic images were obtained following ultrasound guided biopsy of the bilateral breasts. The biopsy marking clips are in expected position at the sites of biopsy. IMPRESSION: 1. Appropriate positioning of the ribbon shaped biopsy marking clip at the site of biopsy in the lateral left breast. 2. Appropriate positioning of the coil shaped biopsy marking clip in the retroareolar right breast. Final Assessment: Post Procedure Mammograms for Marker Placement Electronically Signed   By: Frederico Hamman M.D.   On: 01/15/2023 09:34   MM CLIP PLACEMENT LEFT  Result Date: 01/15/2023 CLINICAL DATA:  Post biopsy mammogram of the bilateral breasts for clip placement. EXAM: 3D DIAGNOSTIC BILATERAL MAMMOGRAM POST ULTRASOUND BIOPSY COMPARISON:  Previous exam(s). FINDINGS: 3D Mammographic images were obtained following ultrasound guided biopsy of the bilateral breasts. The biopsy marking clips  are in expected position at the sites of biopsy. IMPRESSION: 1. Appropriate positioning of the ribbon shaped biopsy marking clip at the site of biopsy in the lateral left breast. 2. Appropriate positioning of the coil shaped biopsy marking clip in the retroareolar right breast. Final Assessment: Post Procedure Mammograms for Marker Placement Electronically Signed   By: Frederico Hamman M.D.   On: 01/15/2023 09:34    PERFORMANCE STATUS (ECOG) : 1 - Symptomatic but completely ambulatory  Review of Systems Unless otherwise noted, a  complete review of systems is negative.  Physical Exam General: NAD Cardiovascular: regular rate and rhythm Pulmonary: clear ant fields Abdomen: soft, nontender, + bowel sounds GU: no suprapubic tenderness Extremities: no edema, no joint deformities Skin: no rashes Neurological: Weakness but otherwise nonfocal  IMPRESSION/PLAN: Rectal bleeding -unclear etiology but suspect possible hemorrhoidal bleeding.  Patient declined exam today.  She is interested in referral to GI.  Discussed ED triggers in detail should bleeding recur.  Hyperbilirubinemia -suspect secondary to chemotherapy.  Will trend labs.  Case and plan discussed with Dr. [name]  Patient expressed understanding and was in agreement with this plan. She also understands that She can call clinic at any time with any questions, concerns, or complaints.   Thank you for allowing me to participate in the care of this very pleasant patient.   Time Total: 15 minutes  Visit consisted of counseling and education dealing with the complex and emotionally intense issues of symptom management in the setting of serious illness.Greater than 50%  of this time was spent counseling and coordinating care related to the above assessment and plan.  Signed by: Laurette Schimke, PhD, NP-C

## 2023-02-08 NOTE — Telephone Encounter (Signed)
Ms. Sarao called this morning and had not went to the ER because she didn't have anymore bleeding last night.   This morning she did notice a small amount of bright red blood when she wiped.   She asked if she could be seen today.   She will be seen in [facility] at 10:00.   Appt. Details given to her.

## 2023-02-09 ENCOUNTER — Inpatient Hospital Stay: Payer: Medicare HMO

## 2023-02-09 VITALS — BP 129/67 | HR 88 | Temp 97.6°F | Resp 16 | Wt 278.2 lb

## 2023-02-09 DIAGNOSIS — Z171 Estrogen receptor negative status [ER-]: Secondary | ICD-10-CM

## 2023-02-09 DIAGNOSIS — Z5111 Encounter for antineoplastic chemotherapy: Secondary | ICD-10-CM | POA: Diagnosis not present

## 2023-02-09 LAB — CBC WITH DIFFERENTIAL (CANCER CENTER ONLY)
Abs Immature Granulocytes: 0.05 10*3/uL (ref 0.00–0.07)
Basophils Absolute: 0.1 10*3/uL (ref 0.0–0.1)
Basophils Relative: 1 %
Eosinophils Absolute: 0.3 10*3/uL (ref 0.0–0.5)
Eosinophils Relative: 6 %
HCT: 36.4 % (ref 36.0–46.0)
Hemoglobin: 11.2 g/dL — ABNORMAL LOW (ref 12.0–15.0)
Immature Granulocytes: 1 %
Lymphocytes Relative: 26 %
Lymphs Abs: 1.2 10*3/uL (ref 0.7–4.0)
MCH: 27.5 pg (ref 26.0–34.0)
MCHC: 30.8 g/dL (ref 30.0–36.0)
MCV: 89.4 fL (ref 80.0–100.0)
Monocytes Absolute: 0.1 10*3/uL (ref 0.1–1.0)
Monocytes Relative: 3 %
Neutro Abs: 3 10*3/uL (ref 1.7–7.7)
Neutrophils Relative %: 63 %
Platelet Count: 179 10*3/uL (ref 150–400)
RBC: 4.07 MIL/uL (ref 3.87–5.11)
RDW: 13.7 % (ref 11.5–15.5)
WBC Count: 4.7 10*3/uL (ref 4.0–10.5)
nRBC: 0 % (ref 0.0–0.2)

## 2023-02-09 MED ORDER — SODIUM CHLORIDE 0.9 % IV SOLN
60.0000 mg/m2 | Freq: Once | INTRAVENOUS | Status: AC
  Administered 2023-02-09: 144 mg via INTRAVENOUS
  Filled 2023-02-09: qty 24

## 2023-02-09 MED ORDER — FAMOTIDINE IN NACL 20-0.9 MG/50ML-% IV SOLN
20.0000 mg | Freq: Once | INTRAVENOUS | Status: AC
  Administered 2023-02-09: 20 mg via INTRAVENOUS
  Filled 2023-02-09: qty 50

## 2023-02-09 MED ORDER — SODIUM CHLORIDE 0.9 % IV SOLN: 80.0000 mg/m2 | Freq: Once | INTRAVENOUS | Status: DC

## 2023-02-09 MED ORDER — PALONOSETRON HCL INJECTION 0.25 MG/5ML
0.2500 mg | Freq: Once | INTRAVENOUS | Status: AC
  Administered 2023-02-09: 0.25 mg via INTRAVENOUS
  Filled 2023-02-09: qty 5

## 2023-02-09 MED ORDER — SODIUM CHLORIDE 0.9 % IV SOLN
162.6000 mg | Freq: Once | INTRAVENOUS | Status: AC
  Administered 2023-02-09: 160 mg via INTRAVENOUS
  Filled 2023-02-09: qty 16

## 2023-02-09 MED ORDER — SODIUM CHLORIDE 0.9 % IV SOLN
10.0000 mg | Freq: Once | INTRAVENOUS | Status: AC
  Administered 2023-02-09: 10 mg via INTRAVENOUS
  Filled 2023-02-09: qty 10

## 2023-02-09 MED ORDER — DIPHENHYDRAMINE HCL 50 MG/ML IJ SOLN
25.0000 mg | Freq: Once | INTRAMUSCULAR | Status: AC
  Administered 2023-02-09: 25 mg via INTRAVENOUS
  Filled 2023-02-09: qty 1

## 2023-02-09 MED ORDER — HEPARIN SOD (PORK) LOCK FLUSH 100 UNIT/ML IV SOLN
500.0000 [IU] | Freq: Once | INTRAVENOUS | Status: AC | PRN
  Administered 2023-02-09: 500 [IU]
  Filled 2023-02-09: qty 5

## 2023-02-09 MED ORDER — SODIUM CHLORIDE 0.9 % IV SOLN
Freq: Once | INTRAVENOUS | Status: AC
  Filled 2023-02-09: qty 250

## 2023-02-09 NOTE — Progress Notes (Signed)
Modify Paclitaxel dose to 60 mg/m2 today for elevated Tbili = 3 per Dr [name]. Will reassess next week based on labs. OK to proceed w/ Carboplatin as planned; no dose reduction necessary for hepatic insufficiency.  Ebony Hail, Pharm.D., CPP 02/09/2023@10 :38 AM

## 2023-02-12 ENCOUNTER — Encounter: Payer: Self-pay | Admitting: Oncology

## 2023-02-14 ENCOUNTER — Other Ambulatory Visit: Payer: Self-pay | Admitting: Oncology

## 2023-02-15 ENCOUNTER — Inpatient Hospital Stay: Payer: Medicare HMO

## 2023-02-15 ENCOUNTER — Encounter: Payer: Self-pay | Admitting: Oncology

## 2023-02-15 ENCOUNTER — Inpatient Hospital Stay: Payer: Medicare HMO | Admitting: Licensed Clinical Social Worker

## 2023-02-15 DIAGNOSIS — C50412 Malignant neoplasm of upper-outer quadrant of left female breast: Secondary | ICD-10-CM | POA: Diagnosis not present

## 2023-02-15 DIAGNOSIS — Z171 Estrogen receptor negative status [ER-]: Secondary | ICD-10-CM

## 2023-02-15 DIAGNOSIS — Z1379 Encounter for other screening for genetic and chromosomal anomalies: Secondary | ICD-10-CM

## 2023-02-15 MED FILL — Dexamethasone Sodium Phosphate Inj 100 MG/10ML: INTRAMUSCULAR | Qty: 1 | Status: AC

## 2023-02-15 NOTE — Progress Notes (Signed)
REFERRING PROVIDER: Creig Hines, MD [address] [place],  [place] [address]  PRIMARY PROVIDER:  Rayetta Humphrey, MD  PRIMARY REASON FOR VISIT:  1. Genetic testing   2. Malignant neoplasm of upper-outer quadrant of left breast in female, estrogen receptor negative (HCC)      HISTORY OF PRESENT ILLNESS:   Jennifer Gregory, a 77 y.o. female, was seen for a [facility] cancer genetics consultation at the request of Dr. [name] due to a personal history of triple negative breast cancer.  Jennifer Gregory presents to clinic today to discuss the possibility of a hereditary predisposition to cancer, genetic testing, and to further clarify her future cancer risks, as well as potential cancer risks for family members.   In 2024, at the age of 56, Jennifer Gregory was diagnosed with invasive mammary carcinoma of the left breast, triple negative. The treatment plan includes neoadjuvant chemotherapy, surgery, adjuvant radiation. 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  CANCER HISTORY:  Oncology History  Malignant neoplasm of upper-outer quadrant of left breast in female, estrogen receptor negative (HCC)  01/19/2023 Initial Diagnosis   Malignant neoplasm of upper-outer quadrant of left breast in female, estrogen receptor negative (HCC)   01/19/2023 Cancer Staging   Staging form: Breast, AJCC 8th Edition - Clinical stage from 01/19/2023: Stage IIB (cT2, cN0, cM0, G3, ER-, PR-, HER2-) - Signed by Creig Hines, MD on 01/19/2023 Stage prefix: Initial diagnosis Histologic grading system: 3 grade system   02/02/2023 -  Chemotherapy   Patient is on Treatment Plan : BREAST Pembrolizumab (200) D1 + Carboplatin (1.5) weekly + Paclitaxel (80) weekly      Genetic Testing   Negative genetic testing. No pathogenic variants identified on the [facility] Common Hereditary Cancers+RNA panel. The report date is 01/27/2023.  The Common Hereditary Cancers Panel + RNA offered  by [facility] includes sequencing and/or deletion duplication testing of the following 48 genes: APC*, ATM*, AXIN2, BAP1, BARD1, BMPR1A, BRCA1, BRCA2, BRIP1, CDH1, CDK4, CDKN2A (p14ARF), CDKN2A (p16INK4a), CHEK2, CTNNA1, DICER1*, EPCAM*, FH*, GREM1*, HOXB13, KIT, MBD4, MEN1*, MLH1*, MSH2*, MSH3*, MSH6*, MUTYH, NF1*, NTHL1, PALB2, PDGFRA, PMS2*, POLD1*, POLE, PTEN*, RAD51C, RAD51D, SDHA*, SDHB, SDHC*, SDHD, SMAD4, SMARCA4, STK11, TP53, TSC1*, TSC2, VHL.       Past Medical History:  Diagnosis Date   Atrial fibrillation [facility])    Atrial fibrillation with RVR (HCC)    Breast cancer, left (HCC) 11/2022   CHF (congestive heart failure) (HCC)    Complication of anesthesia    respiratory depression; needs to sit up immediately after   Coronary artery disease    Edema of both lower extremities    GERD (gastroesophageal reflux disease)    Hypertension, essential    Hyperthyroidism    Morbid obesity with BMI of 45.0-49.9, adult (HCC)    Pneumonia    Pneumonia due to COVID-19 virus    Sick sinus syndrome (HCC)    Sleep apnea    Type 2 diabetes mellitus without complication (HCC)    Vitamin D deficiency     Past Surgical History:  Procedure Laterality Date   ABSCESS DRAINAGE     BREAST BIOPSY Left 01/15/2023   [place] Bx Left Ribbon - Path pending   breast biopsy Right 01/15/2023   [place] Bx Right Coil Clip - Path Pending   BREAST BIOPSY Right 01/15/2023   [place] RT BREAST BX W LOC DEV 1ST LESION IMG BX SPEC US GUIDE 01/15/2023  [facility]   BREAST BIOPSY Left 01/15/2023   [place] LT BREAST BX W LOC DEV 1ST LESION IMG BX SPEC US GUIDE 01/15/2023 [facility]   CARDIAC CATHETERIZATION     CARDIAC PACEMAKER PLACEMENT  06/26/2008   [employer] dual chamber   CESAREAN SECTION     x 3   CHOLECYSTECTOMY     PACEMAKER GENERATOR CHANGE  03/28/2016   PORTACATH PLACEMENT Right 01/31/2023   Procedure: INSERTION PORT-A-CATH;  Surgeon: Carolan Shiver, MD;  Location: [facility] ORS;  Service: General;  Laterality:  Right;   TOTAL ABDOMINAL HYSTERECTOMY     TUBAL LIGATION      FAMILY HISTORY:  We obtained a detailed, 4-generation family history.  Significant diagnoses are listed below: Family History  Problem Relation Age of Onset   Mesothelioma Father    Jennifer Gregory has 2 sons, 1 daughter, no cancers. She had 2 brothers and 2 sisters, no cancers.  Jennifer Gregory mother died at 24. No known cancers on her side of the family.  Jennifer Gregory's father died at 60 and had history of mesothelioma. No known cancers on this side of the family, although an uncle may have had cancer.   Jennifer Gregory is unaware of previous family history of genetic testing for hereditary cancer risks. There is  reported Ashkenazi Jewish ancestry (26% per Ancestry [employer]). There is no known consanguinity.    GENETIC COUNSELING ASSESSMENT: Jennifer Gregory is a 77 y.o. female with a personal history of triple negative breast cancer which is somewhat suggestive of a hereditary cancer syndrome and predisposition to cancer. We, therefore, discussed and recommended the following at today's visit.   DISCUSSION: We discussed that approximately 10% of breast cancer is hereditary. Most cases of hereditary breast cancer are associated with BRCA1/BRCA2 genes, although there are other genes associated with hereditary cancer as well. Cancers and risks are gene specific. We discussed that testing is beneficial for several reasons including knowing about cancer risks, identifying potential screening and risk-reduction options that may be appropriate, and to understand if other family members could be at risk for cancer and allow them to undergo genetic testing.   We reviewed the characteristics, features and inheritance patterns of hereditary cancer syndromes. We also discussed genetic testing, including the appropriate family members to test, the process of testing, insurance coverage and turn-around-time for results. We discussed the implications of a negative,  positive and/or variant of uncertain significant result. We recommended Jennifer Gregory pursue genetic testing for the [facility] Common Hereditary Cancers+RNA gene panel. She had blood drawn for this at her initial oncology appointment.  GENETIC TEST RESULTS:  The [facility] Common Hereditary Cancers+RNA Panel found no pathogenic mutations.   The Common Hereditary Cancers Panel + RNA offered by [facility] includes sequencing and/or deletion duplication testing of the following 48 genes: APC*, ATM*, AXIN2, BAP1, BARD1, BMPR1A, BRCA1, BRCA2, BRIP1, CDH1, CDK4, CDKN2A (p14ARF), CDKN2A (p16INK4a), CHEK2, CTNNA1, DICER1*, EPCAM*, FH*, GREM1*, HOXB13, KIT, MBD4, MEN1*, MLH1*, MSH2*, MSH3*, MSH6*, MUTYH, NF1*, NTHL1, PALB2, PDGFRA, PMS2*, POLD1*, POLE, PTEN*, RAD51C, RAD51D, SDHA*, SDHB, SDHC*, SDHD, SMAD4, SMARCA4, STK11, TP53, TSC1*, TSC2, VHL.   The test report has been scanned into EPIC and is located under the Molecular Pathology section of the Results Review tab.  A portion of the result report is included below for reference. Genetic testing reported out on 01/27/2023.    Even though a pathogenic variant was not identified, possible explanations for the cancer in the family may include: There may be no hereditary risk for  cancer in the family. The cancers in Jennifer Gregory and/or her family may be sporadic/familial or due to other genetic and environmental factors. There may be a gene mutation in one of these genes that current testing methods cannot detect but that chance is small. There could be another gene that has not yet been discovered, or that we have not yet tested, that is responsible for the cancer diagnoses in the family.  It is also possible there is a hereditary cause for the cancer in the family that Jennifer Gregory did not inherit.  Therefore, it is important to remain in touch with cancer genetics in the future so that we can continue to offer Jennifer Gregory the most up to date genetic testing.   ADDITIONAL  GENETIC TESTING:  We discussed with Jennifer Gregory that her genetic testing was fairly extensive.  If there are additional relevant genes identified to increase cancer risk that can be analyzed in the future, we would be happy to discuss and coordinate this testing at that time.   CANCER SCREENING RECOMMENDATIONS:  Jennifer Gregory test result is considered negative (normal).  This means that we have not identified a hereditary cause for her personal history of cancer at this time.   An individual's cancer risk and medical management are not determined by genetic test results alone. Overall cancer risk assessment incorporates additional factors, including personal medical history, family history, and any available genetic information that may result in a personalized plan for cancer prevention and surveillance. Therefore, it is recommended she continue to follow the cancer management and screening guidelines provided by her oncology and primary healthcare provider.  RECOMMENDATIONS FOR FAMILY MEMBERS:   Since she did not inherit a identifiable mutation in a cancer predisposition gene included on this panel, her children could not have inherited a known mutation from her in one of these genes. Individuals in this family might be at some increased risk of developing cancer, over the general population risk, due to the family history of cancer.  Individuals in the family should notify their providers of the family history of cancer. We recommend women in this family have a yearly mammogram beginning at age 26, or 43 years younger than the earliest onset of cancer, an annual clinical breast exam, and perform monthly breast self-exams.  Family members should have colonoscopies by at age 39, or earlier, as recommended by their providers.  FOLLOW-UP:  Lastly, we discussed with Jennifer Gregory that cancer genetics is a rapidly advancing field and it is possible that new genetic tests will be appropriate for her and/or her  family members in the future. We encouraged her to remain in contact with cancer genetics on an annual basis so we can update her personal and family histories and let her know of advances in cancer genetics that may benefit this family.   Our contact number was provided. Ms. Era questions were answered to her satisfaction, and she knows she is welcome to call us at anytime with additional questions or concerns.    Jennifer Duverney, MS, [facility] Genetic Counselor [email or web address] Phone: [phone]  The patient was seen for a total of 20 minutes in face-to-face genetic counseling.  Dr. Blake Divine was available for discussion regarding this case.   _______________________________________________________________________ For Office Staff:  Number of people involved in session: 1 Was an Intern/ student involved with case: no

## 2023-02-15 NOTE — Progress Notes (Signed)
.  brtcl

## 2023-02-16 ENCOUNTER — Encounter: Payer: Self-pay | Admitting: Oncology

## 2023-02-16 ENCOUNTER — Inpatient Hospital Stay: Payer: Medicare HMO

## 2023-02-16 ENCOUNTER — Inpatient Hospital Stay: Payer: Medicare HMO | Admitting: Oncology

## 2023-02-16 ENCOUNTER — Encounter: Payer: Self-pay | Admitting: *Deleted

## 2023-02-16 VITALS — BP 118/73 | HR 96 | Temp 97.1°F | Resp 18 | Ht 63.0 in | Wt 274.0 lb

## 2023-02-16 DIAGNOSIS — C50412 Malignant neoplasm of upper-outer quadrant of left female breast: Secondary | ICD-10-CM | POA: Diagnosis not present

## 2023-02-16 DIAGNOSIS — Z5111 Encounter for antineoplastic chemotherapy: Secondary | ICD-10-CM

## 2023-02-16 DIAGNOSIS — Z171 Estrogen receptor negative status [ER-]: Secondary | ICD-10-CM

## 2023-02-16 LAB — CBC WITH DIFFERENTIAL (CANCER CENTER ONLY)
Abs Immature Granulocytes: 0.02 10*3/uL (ref 0.00–0.07)
Basophils Absolute: 0 10*3/uL (ref 0.0–0.1)
Basophils Relative: 1 %
Eosinophils Absolute: 0.1 10*3/uL (ref 0.0–0.5)
Eosinophils Relative: 3 %
HCT: 36.9 % (ref 36.0–46.0)
Hemoglobin: 11.4 g/dL — ABNORMAL LOW (ref 12.0–15.0)
Immature Granulocytes: 1 %
Lymphocytes Relative: 41 %
Lymphs Abs: 1.3 10*3/uL (ref 0.7–4.0)
MCH: 27.5 pg (ref 26.0–34.0)
MCHC: 30.9 g/dL (ref 30.0–36.0)
MCV: 89.1 fL (ref 80.0–100.0)
Monocytes Absolute: 0.2 10*3/uL (ref 0.1–1.0)
Monocytes Relative: 5 %
Neutro Abs: 1.6 10*3/uL — ABNORMAL LOW (ref 1.7–7.7)
Neutrophils Relative %: 49 %
Platelet Count: 205 10*3/uL (ref 150–400)
RBC: 4.14 MIL/uL (ref 3.87–5.11)
RDW: 14 % (ref 11.5–15.5)
WBC Count: 3.2 10*3/uL — ABNORMAL LOW (ref 4.0–10.5)
nRBC: 0 % (ref 0.0–0.2)

## 2023-02-16 LAB — CMP (CANCER CENTER ONLY)
ALT: 52 U/L — ABNORMAL HIGH (ref 0–44)
AST: 29 U/L (ref 15–41)
Albumin: 3.2 g/dL — ABNORMAL LOW (ref 3.5–5.0)
Alkaline Phosphatase: 89 U/L (ref 38–126)
Anion gap: 9 (ref 5–15)
BUN: 17 mg/dL (ref 8–23)
CO2: 26 mmol/L (ref 22–32)
Calcium: 8.4 mg/dL — ABNORMAL LOW (ref 8.9–10.3)
Chloride: 102 mmol/L (ref 98–111)
Creatinine: 0.92 mg/dL (ref 0.44–1.00)
GFR, Estimated: >60 mL/min (ref >60)
Glucose, Bld: 184 mg/dL — ABNORMAL HIGH (ref 70–99)
Potassium: 3.8 mmol/L (ref 3.5–5.1)
Sodium: 137 mmol/L (ref 135–145)
Total Bilirubin: 1.2 mg/dL (ref 0.3–1.2)
Total Protein: 7.1 g/dL (ref 6.5–8.1)

## 2023-02-16 MED ORDER — SODIUM CHLORIDE 0.9 % IV SOLN
Freq: Once | INTRAVENOUS | Status: AC
  Filled 2023-02-16: qty 250

## 2023-02-16 MED ORDER — FAMOTIDINE IN NACL 20-0.9 MG/50ML-% IV SOLN
20.0000 mg | Freq: Once | INTRAVENOUS | Status: AC
  Administered 2023-02-16: 20 mg via INTRAVENOUS
  Filled 2023-02-16: qty 50

## 2023-02-16 MED ORDER — SODIUM CHLORIDE 0.9 % IV SOLN
162.6000 mg | Freq: Once | INTRAVENOUS | Status: AC
  Administered 2023-02-16: 160 mg via INTRAVENOUS
  Filled 2023-02-16: qty 16

## 2023-02-16 MED ORDER — SODIUM CHLORIDE 0.9 % IV SOLN
10.0000 mg | Freq: Once | INTRAVENOUS | Status: AC
  Administered 2023-02-16: 10 mg via INTRAVENOUS
  Filled 2023-02-16: qty 10

## 2023-02-16 MED ORDER — SODIUM CHLORIDE 0.9 % IV SOLN
80.0000 mg/m2 | Freq: Once | INTRAVENOUS | Status: AC
  Administered 2023-02-16: 192 mg via INTRAVENOUS
  Filled 2023-02-16: qty 32

## 2023-02-16 MED ORDER — DIPHENHYDRAMINE HCL 50 MG/ML IJ SOLN
25.0000 mg | Freq: Once | INTRAMUSCULAR | Status: AC
  Administered 2023-02-16: 25 mg via INTRAVENOUS
  Filled 2023-02-16: qty 1

## 2023-02-16 MED ORDER — PALONOSETRON HCL INJECTION 0.25 MG/5ML
0.2500 mg | Freq: Once | INTRAVENOUS | Status: AC
  Administered 2023-02-16: 0.25 mg via INTRAVENOUS
  Filled 2023-02-16: qty 5

## 2023-02-16 MED ORDER — SODIUM CHLORIDE 0.9% FLUSH
10.0000 mL | Freq: Once | INTRAVENOUS | Status: AC
  Administered 2023-02-16: 10 mL via INTRAVENOUS
  Filled 2023-02-16: qty 10

## 2023-02-16 NOTE — Progress Notes (Signed)
Called [facility] scheduling number at [phone].   I was told they don't see the order that was faxed on 9/6.   The scheduler is sending a message to the team to look for the order and I was told they will call me back.   I offered to re-fax the order but they want to see if they can find previous order first.

## 2023-02-16 NOTE — Progress Notes (Signed)
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  This was followed by a diagnostic mammogram and ultrasound.  Mammogram showed 3.4 x 1.8 x 3.5 cm mass at the 3 o'clock position of the left breast.  There was also an intraductal mass located at the 12 o'clock position of the right breast.  No axillary adenopathy was noted.  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  Right breast mass showed fragments of epithelial proliferation with atypia differential diagnosis includes intraductal papilloma with at least atypical ductal hyperplasia.   Patient has a history of heart failure with reduced ejection fraction and atrial fibrillation for which she follows up with Dr. Darin Engels from cardiology.  Plan is to proceed with 12 weeks of weekly CarboTaxol chemotherapy along with Keytruda.  Anthracycline will not be given    Interval history-tolerating treatments well so far and denies any complaints.  She is yet to get her preoperative MRI at [facility] PS- 2 Pain scale- 0   Review of systems- Review of Systems  Constitutional:  Negative for chills, fever, malaise/fatigue and weight loss.   HENT:  Negative for congestion, ear discharge and nosebleeds.   Eyes:  Negative for blurred vision.  Respiratory:  Negative for cough, hemoptysis, sputum production, shortness of breath and wheezing.   Cardiovascular:  Negative for chest pain, palpitations, orthopnea and claudication.  Gastrointestinal:  Negative for abdominal pain, blood in stool, constipation, diarrhea, heartburn, melena, nausea and vomiting.  Genitourinary:  Negative for dysuria, flank pain, frequency, hematuria and urgency.  Musculoskeletal:  Negative for back pain, joint pain and myalgias.  Skin:  Negative for rash.  Neurological:  Negative for dizziness, tingling, focal weakness, seizures, weakness and headaches.  Endo/Heme/Allergies:  Does not bruise/bleed easily.  Psychiatric/Behavioral:  Negative for depression and suicidal ideas. The patient does not have insomnia.       Allergies  Allergen Reactions   Aspirin Other (See Comments)    Bleeding risk since patient is on xarelta   Ciprofloxacin Other (See Comments)    Other Reaction: RASH ITCHING    Codeine Other (See Comments)   Diazepam Other (See Comments) and Nausea And Vomiting   Dofetilide Other (See Comments)    Other Reaction: arrhythmia    Hydrocodone-Acetaminophen Other (See Comments) and Nausea And Vomiting   Morphine Nausea And Vomiting   Niacin Other (See Comments)    fatigue   Oxycodone-Acetaminophen Other (See Comments) and Nausea And Vomiting   Sotalol Other (See Comments)    Other Reaction: Prolonged QTc    Amiodarone Other (See Comments)    Affecting liver    Pyridoxine Other (See Comments)    Splitting headache   Sulfamethoxazole-Trimethoprim Swelling   Other Rash  EKG sticker electrodes extended time     Past Medical History:  Diagnosis Date   Atrial fibrillation [facility])    Atrial fibrillation with RVR (HCC)    Breast cancer, left (HCC) 11/2022   CHF (congestive heart failure) (HCC)    Complication of anesthesia     respiratory depression; needs to sit up immediately after   Coronary artery disease    Edema of both lower extremities    GERD (gastroesophageal reflux disease)    Hypertension, essential    Hyperthyroidism    Morbid obesity with BMI of 45.0-49.9, adult (HCC)    Pneumonia    Pneumonia due to COVID-19 virus    Sick sinus syndrome (HCC)    Sleep apnea    Type 2 diabetes mellitus without complication (HCC)    Vitamin D deficiency      Past Surgical History:  Procedure Laterality Date   ABSCESS DRAINAGE     BREAST BIOPSY Left 01/15/2023   [place] Bx Left Ribbon - Path pending   breast biopsy Right 01/15/2023   [place] Bx Right Coil Clip - Path Pending   BREAST BIOPSY Right 01/15/2023   [place] RT BREAST BX W LOC DEV 1ST LESION IMG BX SPEC US GUIDE 01/15/2023 [facility]   BREAST BIOPSY Left 01/15/2023   [place] LT BREAST BX W LOC DEV 1ST LESION IMG BX SPEC US GUIDE 01/15/2023 [facility]   CARDIAC CATHETERIZATION     CARDIAC PACEMAKER PLACEMENT  06/26/2008   [employer] Jude dual chamber   CESAREAN SECTION     x 3   CHOLECYSTECTOMY     PACEMAKER GENERATOR CHANGE  03/28/2016   PORTACATH PLACEMENT Right 01/31/2023   Procedure: INSERTION PORT-A-CATH;  Surgeon: Carolan Shiver, MD;  Location: [facility] ORS;  Service: General;  Laterality: Right;   TOTAL ABDOMINAL HYSTERECTOMY     TUBAL LIGATION      Social History   Socioeconomic History   Marital status: Single    Spouse name: Not on file   Number of children: 3   Years of education: Not on file   Highest education level: Not on file  Occupational History   Not on file  Tobacco Use   Smoking status: Never   Smokeless tobacco: Never  Vaping Use   Vaping status: Never Used  Substance and Sexual Activity   Alcohol use: Not Currently   Drug use: Never   Sexual activity: Not Currently  Other Topics Concern   Not on file  Social History Narrative   Not on file   Social Determinants of Health   Financial Resource Strain: Low Risk   (09/06/2022)   Received from [facility]   Overall Financial Resource Strain (CARDIA)    Difficulty of Paying Living Expenses: Not hard at all  Food Insecurity: No Food Insecurity (01/19/2023)   Hunger Vital Sign    Worried About Running Out of Food in the Last Year: Never true    Ran Out of Food in the Last Year: Never true  Transportation Needs: No Transportation Needs (01/19/2023)   PRAPARE - Administrator, Civil Service (Medical): No    Lack of Transportation (Non-Medical): No  Physical Activity: Not on file  Stress: Not on file  Social Connections: Not on file  Intimate Partner Violence: Not At Risk (01/19/2023)   Humiliation, Afraid, Rape, and Kick questionnaire    Fear of Current or Ex-Partner: No    Emotionally Abused: No    Physically Abused: No  Sexually Abused: No    Family History  Problem Relation Age of Onset   Mesothelioma Father      Current Outpatient Medications:    citalopram (CELEXA) 10 MG tablet, Take 10 mg by mouth daily., Disp: , Rfl:    dexamethasone (DECADRON) 4 MG tablet, Take 2 tablets daily for 2 days, start the day after chemotherapy. Take with food., Disp: 30 tablet, Rfl: 1   lidocaine-prilocaine (EMLA) cream, Apply to affected area once, Disp: 30 g, Rfl: 3   metoprolol succinate (TOPROL-XL) 25 MG 24 hr tablet, Take 12.5 mg by mouth daily., Disp: , Rfl:    Multiple Vitamins-Minerals (CENTRUM SILVER 50+WOMEN PO), Take 1 tablet by mouth daily., Disp: , Rfl:    ondansetron (ZOFRAN) 4 MG tablet, Take 1 tablet (4 mg total) by mouth daily as needed for nausea or vomiting., Disp: 30 tablet, Rfl: 1   potassium chloride (KLOR-CON) 10 MEQ tablet, Take 30 mEq by mouth 2 (two) times daily. Am and 4 pm, Disp: , Rfl:    prochlorperazine (COMPAZINE) 10 MG tablet, Take 1 tablet (10 mg total) by mouth every 6 (six) hours as needed for nausea or vomiting., Disp: 30 tablet, Rfl: 1   rivaroxaban (XARELTO) 20 MG TABS tablet, Take 20 mg by  mouth every evening., Disp: , Rfl:    torsemide (DEMADEX) 20 MG tablet, Take 40 mg by mouth daily in the afternoon. 4 pm, Disp: , Rfl:    nitroGLYCERIN (NITROSTAT) 0.4 MG SL tablet, Place 0.4 mg under the tongue every 5 (five) minutes as needed for chest pain. (Patient not taking: Reported on 02/08/2023), Disp: , Rfl:    ondansetron (ZOFRAN) 8 MG tablet, Take 1 tablet (8 mg total) by mouth every 8 (eight) hours as needed for nausea or vomiting. Start on the third day after chemotherapy. (Patient not taking: Reported on 02/08/2023), Disp: 30 tablet, Rfl: 1 No current facility-administered medications for this visit.  Facility-Administered Medications Ordered in Other Visits:    CARBOplatin (PARAPLATIN) 160 mg in sodium chloride 0.9 % 100 mL chemo infusion, 160 mg, Intravenous, Once, Creig Hines, MD, Last Rate: 232 mL/hr at 02/16/23 1229, 160 mg at 02/16/23 1229  Physical exam:  Vitals:   02/16/23 0854  BP: 118/73  Pulse: 96  Resp: 18  Temp: (!) 97.1 F (36.2 C)  TempSrc: Tympanic  SpO2: (!) 74%  Weight: 274 lb (124.3 kg)  Height: 5\' 3"  (1.6 m)   Physical Exam Constitutional:      Comments: Ambulates with a walker.  Appears in no acute distress  Cardiovascular:     Rate and Rhythm: Normal rate and regular rhythm.     Heart sounds: Normal heart sounds.  Pulmonary:     Effort: Pulmonary effort is normal.     Breath sounds: Normal breath sounds.  Skin:    General: Skin is warm and dry.  Neurological:     Mental Status: She is alert and oriented to person, place, and time.         Latest Ref Rng & Units 02/16/2023    8:39 AM  CMP  Glucose 70 - 99 mg/dL 161   BUN 8 - 23 mg/dL 17   Creatinine 0.96 - 1.00 mg/dL 0.45   Sodium 409 - 811 mmol/L 137   Potassium 3.5 - 5.1 mmol/L 3.8   Chloride 98 - 111 mmol/L 102   CO2 22 - 32 mmol/L 26   Calcium 8.9 - 10.3 mg/dL 8.4   Total Protein 6.5 -  8.1 g/dL 7.1   Total Bilirubin 0.3 - 1.2 mg/dL 1.2   Alkaline Phos 38 - 126 U/L 89   AST  15 - 41 U/L 29   ALT 0 - 44 U/L 52       Latest Ref Rng & Units 02/16/2023    8:39 AM  CBC  WBC 4.0 - 10.5 K/uL 3.2   Hemoglobin 12.0 - 15.0 g/dL 16.1   Hematocrit 09.6 - 46.0 % 36.9   Platelets 150 - 400 K/uL 205     No images are attached to the encounter.  NM PET Image Initial (PI) Skull Base To Thigh  Result Date: 02/12/2023 CLINICAL DATA:  Initial treatment strategy for breast cancer. EXAM: NUCLEAR MEDICINE PET SKULL BASE TO THIGH TECHNIQUE: 12.8 mCi F-18 FDG was injected intravenously. Full-ring PET imaging was performed from the skull base to thigh after the radiotracer. CT data was obtained and used for attenuation correction and anatomic localization. Fasting blood glucose: 101 mg/dl COMPARISON:  None Available. FINDINGS: Mediastinal blood pool activity: SUV max 3.4 Liver activity: SUV max NA NECK: No abnormal hypermetabolism. Incidental CT findings: None. CHEST: Internal jugular, supraclavicular and left axillary lymph nodes are not hypermetabolic. Dominant hypermetabolic nodule in the lower left breast, SUV max 12.1. Separate nodular area of borderline hypermetabolism in the upper outer left breast, SUV max 3.2. Small to borderline enlarged mediastinal lymph nodes, also hypometabolic. 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 Atherosclerotic calcification of the aorta, aortic valve and coronary arteries. Enlarged pulmonic trunk and heart. No pericardial or pleural effusion. ABDOMEN/PELVIS: No abnormal hypermetabolism. Incidental CT findings: Liver margin is slightly irregular. Cholecystectomy. Adrenal glands, kidneys, spleen, pancreas, stomach and bowel are grossly unremarkable. SKELETON: No abnormal hypermetabolism. Incidental CT findings: Degenerative changes in the spine. IMPRESSION: 1. Hypermetabolic left breast nodules, compatible with breast cancer. Left axillary, low internal jugular and mediastinal lymph nodes do not show metabolism above blood pool. Recommend  attention on follow-up. 2. Cirrhosis. 3. Aortic atherosclerosis (ICD10-I70.0). Coronary artery calcification. 4. Enlarged pulmonic trunk, indicative of pulmonary arterial hypertension. Electronically Signed   By: Leanna Battles M.D.   On: 02/12/2023 12:54   DG Chest Port 1 View  Result Date: 01/31/2023 CLINICAL DATA:  [id number] Port-A-Cath in place [id number] EXAM: PORTABLE CHEST 1 VIEW.  Patient is slightly rotated. COMPARISON:  Chest x-ray 01/17/2022, PET CT 01/30/2023 FINDINGS: Interval placement of a right chest wall Port-A-Cath with tip overlying the expected region of the superior cavoatrial junction. Left chest wall dual lead pacemaker in similar position. Enlarged cardiac silhouette. The heart and mediastinal contours are unchanged. Left lower lobe atelectasis. No focal consolidation. No pulmonary edema. No pleural effusion. No pneumothorax. No acute osseous abnormality. IMPRESSION: 1. Interval placement of a right chest wall Port-A-Cath with tip overlying the expected region of the superior cavoatrial junction. 2. No active disease. Electronically Signed   By: Tish Frederickson M.D.   On: 01/31/2023 08:47   DG C-Arm 1-60 Min-No Report  Result Date: 01/31/2023 Fluoroscopy was utilized by the requesting physician.  No radiographic interpretation.     Assessment and plan- Patient is a 77 y.o. female   with invasive mammary carcinoma of the left breast stage II cT2 N0 M0 triple negative.  She is here for on treatment assessment prior to cycle 1 day 15 of weekly CarboTaxol chemotherapy  Patient's bilirubin was 3 last week but is presently normal and LFTs are otherwise normal.  She will proceed with full dose of CarboTaxol chemotherapy today and Taxol  will be given at 80 mg/m.  So far she is tolerating it well without any significant neuropathy.  She will directly proceed for cycle 2-day 1 of CarboTaxol Keytruda next week and I will see her back in 2 weeks for cycle 2-day 8 of CarboTaxol chemotherapy.   Patient states that she has tried to call [facility] and get her MRI scheduled there as she has a pacemaker but she has not been able to make any appointments.  I am having Irving Shows our breast cancer navigator look into this as well.   Visit Diagnosis 1. Encounter for antineoplastic chemotherapy   2. Malignant neoplasm of upper-outer quadrant of left breast in female, estrogen receptor negative (HCC)      Dr. Owens Shark, MD, MPH [facility] [phone] 02/16/2023 12:59 PM

## 2023-02-16 NOTE — Patient Instructions (Signed)
[place] [facility]  Discharge Instructions: Thank you for choosing [facility] to provide your oncology and hematology care.  If you have a lab appointment with the Cancer Center, please go directly to the Cancer Center and check in at the registration area.  Wear comfortable clothing and clothing appropriate for easy access to any Portacath or PICC line.   We strive to give you quality time with your provider. You may need to reschedule your appointment if you arrive late (15 or more minutes).  Arriving late affects you and other patients whose appointments are after yours.  Also, if you miss three or more appointments without notifying the office, you may be dismissed from the clinic at the provider's discretion.      For prescription refill requests, have your pharmacy contact our office and allow 72 hours for refills to be completed.    Today you received the following chemotherapy and/or immunotherapy agents Carboplatin & Taxol      To help prevent nausea and vomiting after your treatment, we encourage you to take your nausea medication as directed.  BELOW ARE SYMPTOMS THAT SHOULD BE REPORTED IMMEDIATELY: *FEVER GREATER THAN 100.4 F (38 C) OR HIGHER *CHILLS OR SWEATING *NAUSEA AND VOMITING THAT IS NOT CONTROLLED WITH YOUR NAUSEA MEDICATION *UNUSUAL SHORTNESS OF BREATH *UNUSUAL BRUISING OR BLEEDING *URINARY PROBLEMS (pain or burning when urinating, or frequent urination) *BOWEL PROBLEMS (unusual diarrhea, constipation, pain near the anus) TENDERNESS IN MOUTH AND THROAT WITH OR WITHOUT PRESENCE OF ULCERS (sore throat, sores in mouth, or a toothache) UNUSUAL RASH, SWELLING OR PAIN  UNUSUAL VAGINAL DISCHARGE OR ITCHING   Items with * indicate a potential emergency and should be followed up as soon as possible or go to the Emergency Department if any problems should occur.  Please show the CHEMOTHERAPY ALERT CARD or IMMUNOTHERAPY ALERT CARD at  check-in to the Emergency Department and triage nurse.  Should you have questions after your visit or need to cancel or reschedule your appointment, please contact [place] [facility]  [phone] and follow the prompts.  Office hours are 8:00 a.m. to 4:30 p.m. Monday - Friday. Please note that voicemails left after 4:00 p.m. may not be returned until the following business day.  We are closed weekends and major holidays. You have access to a nurse at all times for urgent questions. Please call the main number to the clinic [phone] and follow the prompts.  For any non-urgent questions, you may also contact your provider using MyChart. We now offer e-Visits for anyone 33 and older to request care online for non-urgent symptoms. For details visit [email or web address].   Also download the MyChart app! Go to the app store, search "MyChart", open the app, select [place], and log in with your MyChart username and password.

## 2023-02-19 ENCOUNTER — Inpatient Hospital Stay: Payer: Medicare HMO

## 2023-02-19 DIAGNOSIS — Z171 Estrogen receptor negative status [ER-]: Secondary | ICD-10-CM

## 2023-02-19 DIAGNOSIS — Z5111 Encounter for antineoplastic chemotherapy: Secondary | ICD-10-CM | POA: Diagnosis not present

## 2023-02-19 MED ORDER — FILGRASTIM-SNDZ 480 MCG/0.8ML IJ SOSY
480.0000 ug | PREFILLED_SYRINGE | Freq: Once | INTRAMUSCULAR | Status: AC
  Administered 2023-02-19: 480 ug via SUBCUTANEOUS
  Filled 2023-02-19: qty 0.8

## 2023-02-20 ENCOUNTER — Ambulatory Visit
Admission: RE | Admit: 2023-02-20 | Discharge: 2023-02-20 | Disposition: A | Discharge status: Home / Self Care | Payer: Medicare HMO | Source: Ambulatory Visit | Attending: Radiation Oncology | Admitting: Radiation Oncology

## 2023-02-20 ENCOUNTER — Inpatient Hospital Stay: Payer: Medicare HMO | Attending: Oncology

## 2023-02-20 ENCOUNTER — Encounter: Payer: Self-pay | Admitting: Radiation Oncology

## 2023-02-20 ENCOUNTER — Telehealth: Payer: Self-pay | Admitting: *Deleted

## 2023-02-20 VITALS — BP 131/67 | HR 87 | Temp 97.0°F | Resp 18 | Ht 64.0 in | Wt 271.3 lb

## 2023-02-20 DIAGNOSIS — I509 Heart failure, unspecified: Secondary | ICD-10-CM | POA: Diagnosis not present

## 2023-02-20 DIAGNOSIS — E0590 Thyrotoxicosis, unspecified without thyrotoxic crisis or storm: Secondary | ICD-10-CM | POA: Insufficient documentation

## 2023-02-20 DIAGNOSIS — I10 Essential (primary) hypertension: Secondary | ICD-10-CM | POA: Diagnosis not present

## 2023-02-20 DIAGNOSIS — R609 Edema, unspecified: Secondary | ICD-10-CM | POA: Diagnosis not present

## 2023-02-20 DIAGNOSIS — N6315 Unspecified lump in the right breast, overlapping quadrants: Secondary | ICD-10-CM | POA: Diagnosis not present

## 2023-02-20 DIAGNOSIS — Z7952 Long term (current) use of systemic steroids: Secondary | ICD-10-CM | POA: Insufficient documentation

## 2023-02-20 DIAGNOSIS — Z79899 Other long term (current) drug therapy: Secondary | ICD-10-CM | POA: Insufficient documentation

## 2023-02-20 DIAGNOSIS — M7989 Other specified soft tissue disorders: Secondary | ICD-10-CM | POA: Diagnosis not present

## 2023-02-20 DIAGNOSIS — Z171 Estrogen receptor negative status [ER-]: Secondary | ICD-10-CM | POA: Insufficient documentation

## 2023-02-20 DIAGNOSIS — I4891 Unspecified atrial fibrillation: Secondary | ICD-10-CM | POA: Insufficient documentation

## 2023-02-20 DIAGNOSIS — C50412 Malignant neoplasm of upper-outer quadrant of left female breast: Secondary | ICD-10-CM | POA: Insufficient documentation

## 2023-02-20 DIAGNOSIS — Z7901 Long term (current) use of anticoagulants: Secondary | ICD-10-CM | POA: Insufficient documentation

## 2023-02-20 DIAGNOSIS — E559 Vitamin D deficiency, unspecified: Secondary | ICD-10-CM | POA: Diagnosis not present

## 2023-02-20 DIAGNOSIS — L270 Generalized skin eruption due to drugs and medicaments taken internally: Secondary | ICD-10-CM | POA: Insufficient documentation

## 2023-02-20 DIAGNOSIS — Z5111 Encounter for antineoplastic chemotherapy: Secondary | ICD-10-CM | POA: Insufficient documentation

## 2023-02-20 DIAGNOSIS — K219 Gastro-esophageal reflux disease without esophagitis: Secondary | ICD-10-CM | POA: Diagnosis not present

## 2023-02-20 DIAGNOSIS — E119 Type 2 diabetes mellitus without complications: Secondary | ICD-10-CM | POA: Diagnosis not present

## 2023-02-20 DIAGNOSIS — I2510 Atherosclerotic heart disease of native coronary artery without angina pectoris: Secondary | ICD-10-CM | POA: Diagnosis not present

## 2023-02-20 MED ORDER — FILGRASTIM-SNDZ 480 MCG/0.8ML IJ SOSY
480.0000 ug | PREFILLED_SYRINGE | Freq: Once | INTRAMUSCULAR | Status: AC
  Administered 2023-02-20: 480 ug via SUBCUTANEOUS
  Filled 2023-02-20: qty 0.8

## 2023-02-20 NOTE — Telephone Encounter (Signed)
Called [facility] radiology appointment scheduling again to check on status of breast MRI.   I spoke with Leory Plowman who again said they are looking for the order.  I gave date and fax confirmation time of 01/26/2023 at 12:13pm when breast MRI was faxed to them.  He again said not to re-fax the order at this time and to check back in a couple days as they have been back logged.

## 2023-02-20 NOTE — Consult Note (Signed)
NEW PATIENT EVALUATION  Name: Jennifer Gregory  MRN: [id number]  Date:   02/20/2023     DOB: Aug 15, 1945   This 77 y.o. 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  REFERRING PHYSICIAN: Rayetta Humphrey, MD  CHIEF COMPLAINT:  Chief Complaint  Patient presents with   Breast Cancer    Consult    DIAGNOSIS: The encounter diagnosis was Malignant neoplasm of upper-outer quadrant of left breast in female, estrogen receptor negative (HCC).   PREVIOUS INVESTIGATIONS:  Pathology reports reviewed Mammogram and ultrasound reviewed PET scan reviewed Clinical notes reviewed  HPI: Patient is a 77 year old female who presented with a self palpated mass in her left breast.  Mammogram demonstrated a 3.4 x 1.8 x 3.5 cm mass 3 o'clock position of the left breast 8 cm from the nipple.  By ultrasound was no axillary adenopathy.  Patient underwent biopsy which was positive for grade 3 invasive mammary carcinoma triple negative.  PET scan demonstrated hypermetabolic left breast nodules compatible with primary breast cancer left axillary lobe internal jugular mediastinal nodes did not show metabolic activity.  Patient has multiple medical comorbidities including reduced ejection fraction atrial fibrillation.  She has been started on 12 weeks of CarboTaxol along with Keytruda.  She is tolerating that only fair.  She states she is having numerous skin lesions as well as some swelling in her extremities.  She adamantly denies she wants radiation therapy.  PLANNED TREATMENT REGIMEN: Completion of neoadjuvant chemotherapy followed by left modified radical mastectomy  PAST MEDICAL HISTORY:  has a past medical history of Atrial fibrillation (HCC), Atrial fibrillation with RVR (HCC), Breast cancer, left (HCC) (11/2022), CHF (congestive heart failure) (HCC),  Complication of anesthesia, Coronary artery disease, Edema of both lower extremities, GERD (gastroesophageal reflux disease), Hypertension, essential, Hyperthyroidism, Morbid obesity with BMI of 45.0-49.9, adult (HCC), Pneumonia, Pneumonia due to COVID-19 virus, Sick sinus syndrome (HCC), Sleep apnea, Type 2 diabetes mellitus without complication (HCC), and Vitamin D deficiency.    PAST SURGICAL HISTORY:  Past Surgical History:  Procedure Laterality Date   ABSCESS DRAINAGE     BREAST BIOPSY Left 01/15/2023   [place] Bx Left Ribbon - Path pending   breast biopsy Right 01/15/2023   [place] Bx Right Coil Clip - Path Pending   BREAST BIOPSY Right 01/15/2023   [place] RT BREAST BX W LOC DEV 1ST LESION IMG BX SPEC US GUIDE 01/15/2023 [facility]   BREAST BIOPSY Left 01/15/2023   [place] LT BREAST BX W LOC DEV 1ST LESION IMG BX SPEC US GUIDE 01/15/2023 [facility]   CARDIAC CATHETERIZATION     CARDIAC PACEMAKER PLACEMENT  06/26/2008   St. Jude dual chamber   CESAREAN SECTION     x 3   CHOLECYSTECTOMY     PACEMAKER GENERATOR CHANGE  03/28/2016   PORTACATH PLACEMENT Right 01/31/2023   Procedure: INSERTION PORT-A-CATH;  Surgeon: Carolan Shiver, MD;  Location: [facility] OR[facility];  Service: General;  Laterality: Right;   TOTAL ABDOMINAL HYSTERECTOMY     TUBAL LIGATION      FAMILY HISTORY: family history includes Mesothelioma in her father.  SOCIAL HISTORY:  reports that she has never smoked. She has never used smokeless tobacco. She reports that she does not currently use alcohol. She reports that she does not use drugs.  ALLERGIES: Aspirin, Ciprofloxacin, Codeine, Diazepam, Dofetilide, Hydrocodone-acetaminophen, Morphine, Niacin, Oxycodone-acetaminophen, Sotalol, Amiodarone, Pyridoxine, Sulfamethoxazole-trimethoprim, and Other  MEDICATIONS:  Current Outpatient Medications  Medication Sig Dispense Refill   citalopram (CELEXA) 10 MG tablet Take 10 mg by mouth daily.     dexamethasone (DECADRON) 4 MG tablet  Take 2 tablets daily for 2 days, start the day after chemotherapy. Take with food. 30 tablet 1   lidocaine-prilocaine (EMLA) cream Apply to affected area once 30 g 3   metoprolol succinate (TOPROL-XL) 25 MG 24 hr tablet Take 12.5 mg by mouth daily.     Multiple Vitamins-Minerals (CENTRUM SILVER 50+WOMEN PO) Take 1 tablet by mouth daily.     nitroGLYCERIN (NITROSTAT) 0.4 MG SL tablet Place 0.4 mg under the tongue every 5 (five) minutes as needed for chest pain. (Patient not taking: Reported on 02/08/2023)     ondansetron (ZOFRAN) 4 MG tablet Take 1 tablet (4 mg total) by mouth daily as needed for nausea or vomiting. 30 tablet 1   ondansetron (ZOFRAN) 8 MG tablet Take 1 tablet (8 mg total) by mouth every 8 (eight) hours as needed for nausea or vomiting. Start on the third day after chemotherapy. (Patient not taking: Reported on 02/08/2023) 30 tablet 1   potassium chloride (KLOR-CON) 10 MEQ tablet Take 30 mEq by mouth 2 (two) times daily. Am and 4 pm     prochlorperazine (COMPAZINE) 10 MG tablet Take 1 tablet (10 mg total) by mouth every 6 (six) hours as needed for nausea or vomiting. 30 tablet 1   rivaroxaban (XARELTO) 20 MG TABS tablet Take 20 mg by mouth every evening.     torsemide (DEMADEX) 20 MG tablet Take 40 mg by mouth daily in the afternoon. 4 pm     No current facility-administered medications for this encounter.    ECOG PERFORMANCE STATUS:  1 - Symptomatic but completely ambulatory  REVIEW OF SYSTEMS: Patient is a history of atrial fibrillation congestive heart failure coronary artery disease hypertension hypothyroidism type 2 diabetes Patient denies any weight loss, fatigue, weakness, fever, chills or night sweats. Patient denies any loss of vision, blurred vision. Patient denies any ringing  of the ears or hearing loss. No irregular heartbeat. Patient denies heart murmur or history of fainting. Patient denies any chest pain or pain radiating to her upper extremities. Patient denies any  shortness of breath, difficulty breathing at night, cough or hemoptysis. Patient denies any swelling in the lower legs. Patient denies any nausea vomiting, vomiting of blood, or coffee ground material in the vomitus. Patient denies any stomach pain. Patient states has had normal bowel movements no significant constipation or diarrhea. Patient denies any dysuria, hematuria or significant nocturia. Patient denies any problems walking, swelling in the joints or loss of balance. Patient denies any skin changes, loss of hair or loss of weight. Patient denies any excessive worrying or anxiety or significant depression. Patient denies any problems with insomnia. Patient denies excessive thirst, polyuria, polydipsia. Patient denies any swollen glands, patient denies easy bruising or easy bleeding. Patient denies any recent infections, allergies or URI. Patient "s visual fields have not changed significantly in recent time.   PHYSICAL EXAM: BP 131/67   Pulse 87   Temp (!) 97 F (36.1 C)   Resp 18   Ht 5\' 4"  (1.626 m)   Wt 271 lb 4.8 oz (123.1 kg)   BMI 46.57 kg/m  Obese female in NAD.  Well-developed well-nourished patient in NAD. HEENT reveals PERLA, EOMI, discs not visualized.  Oral cavity is clear. No oral mucosal lesions are identified. Neck is clear without evidence of cervical  or supraclavicular adenopathy. Lungs are clear to A&P. Cardiac examination is essentially unremarkable with regular rate and rhythm without murmur rub or thrill. Abdomen is benign with no organomegaly or masses noted. Motor sensory and DTR levels are equal and symmetric in the upper and lower extremities. Cranial nerves II through XII are grossly intact. Proprioception is intact. No peripheral adenopathy or edema is identified. No motor or sensory levels are noted. Crude visual fields are within normal range.  LABORATORY DATA: Pathology reports reviewed    RADIOLOGY RESULTS: Mammograms ultrasound and PET scan all reviewed  compatible with above-stated findings   IMPRESSION: Clinical stage IIa triple negative invasive mammary carcinoma the left breast currently undergoing neoadjuvant chemotherapy and 77 year old female  PLAN: At this time patient is adamant about refusing radiation therapy treatments.  I have explained to her her #1 option would be a left modified radical mastectomy which would avoid radiation therapy.  She is inclined to that decision.  Would be happy to reevaluate her after her surgery should there be any need for postmastectomy radiation or if there is no viable way of doing a mastectomy based on her multiple medical comorbidities would plan on a short course of partial breast radiation.  Patient comprehends my recommendations well.  I would like to take this opportunity to thank you for allowing me to participate in the care of your patient.Carmina Miller, MD

## 2023-02-21 ENCOUNTER — Inpatient Hospital Stay: Payer: Medicare HMO

## 2023-02-21 DIAGNOSIS — Z5111 Encounter for antineoplastic chemotherapy: Secondary | ICD-10-CM | POA: Diagnosis not present

## 2023-02-21 DIAGNOSIS — C50412 Malignant neoplasm of upper-outer quadrant of left female breast: Secondary | ICD-10-CM

## 2023-02-21 MED ORDER — FILGRASTIM-SNDZ 480 MCG/0.8ML IJ SOSY
480.0000 ug | PREFILLED_SYRINGE | Freq: Once | INTRAMUSCULAR | Status: AC
  Administered 2023-02-21: 480 ug via SUBCUTANEOUS
  Filled 2023-02-21: qty 0.8

## 2023-02-22 MED FILL — Dexamethasone Sodium Phosphate Inj 100 MG/10ML: INTRAMUSCULAR | Qty: 1 | Status: AC

## 2023-02-23 ENCOUNTER — Inpatient Hospital Stay: Payer: Medicare HMO

## 2023-02-23 ENCOUNTER — Ambulatory Visit: Payer: Medicare HMO | Admitting: Oncology

## 2023-02-23 ENCOUNTER — Other Ambulatory Visit: Payer: Self-pay | Admitting: *Deleted

## 2023-02-23 VITALS — BP 119/65 | HR 91 | Temp 97.3°F | Resp 18 | Wt 270.6 lb

## 2023-02-23 DIAGNOSIS — C50412 Malignant neoplasm of upper-outer quadrant of left female breast: Secondary | ICD-10-CM

## 2023-02-23 DIAGNOSIS — Z5111 Encounter for antineoplastic chemotherapy: Secondary | ICD-10-CM | POA: Diagnosis not present

## 2023-02-23 LAB — CMP (CANCER CENTER ONLY)
ALT: 21 U/L (ref 0–44)
AST: 22 U/L (ref 15–41)
Albumin: 3.1 g/dL — ABNORMAL LOW (ref 3.5–5.0)
Alkaline Phosphatase: 78 U/L (ref 38–126)
Anion gap: 7 (ref 5–15)
BUN: 11 mg/dL (ref 8–23)
CO2: 28 mmol/L (ref 22–32)
Calcium: 8.7 mg/dL — ABNORMAL LOW (ref 8.9–10.3)
Chloride: 103 mmol/L (ref 98–111)
Creatinine: 1.02 mg/dL — ABNORMAL HIGH (ref 0.44–1.00)
GFR, Estimated: 56 mL/min — ABNORMAL LOW (ref >60)
Glucose, Bld: 164 mg/dL — ABNORMAL HIGH (ref 70–99)
Potassium: 4 mmol/L (ref 3.5–5.1)
Sodium: 138 mmol/L (ref 135–145)
Total Bilirubin: 1 mg/dL (ref 0.3–1.2)
Total Protein: 6.7 g/dL (ref 6.5–8.1)

## 2023-02-23 LAB — CBC WITH DIFFERENTIAL (CANCER CENTER ONLY)
Abs Immature Granulocytes: 0.6 10*3/uL — ABNORMAL HIGH (ref 0.00–0.07)
Band Neutrophils: 1 %
Basophils Absolute: 0 10*3/uL (ref 0.0–0.1)
Basophils Relative: 0 %
Eosinophils Absolute: 0.1 10*3/uL (ref 0.0–0.5)
Eosinophils Relative: 2 %
HCT: 36.8 % (ref 36.0–46.0)
Hemoglobin: 11.4 g/dL — ABNORMAL LOW (ref 12.0–15.0)
Lymphocytes Relative: 45 %
Lymphs Abs: 2.2 10*3/uL (ref 0.7–4.0)
MCH: 27.4 pg (ref 26.0–34.0)
MCHC: 31 g/dL (ref 30.0–36.0)
MCV: 88.5 fL (ref 80.0–100.0)
Metamyelocytes Relative: 2 %
Monocytes Absolute: 0.3 10*3/uL (ref 0.1–1.0)
Monocytes Relative: 7 %
Myelocytes: 10 %
Neutro Abs: 1.6 10*3/uL — ABNORMAL LOW (ref 1.7–7.7)
Neutrophils Relative %: 33 %
Platelet Count: 206 10*3/uL (ref 150–400)
RBC: 4.16 MIL/uL (ref 3.87–5.11)
RDW: 14.3 % (ref 11.5–15.5)
Smear Review: NORMAL
WBC Count: 4.8 10*3/uL (ref 4.0–10.5)
nRBC: 0 % (ref 0.0–0.2)

## 2023-02-23 MED ORDER — TRIAMCINOLONE ACETONIDE 0.5 % EX OINT: 1.0000 | TOPICAL_OINTMENT | Freq: Two times a day (BID) | CUTANEOUS | 1 refills | Status: AC | PRN

## 2023-02-23 MED ORDER — DIPHENHYDRAMINE HCL 50 MG/ML IJ SOLN
25.0000 mg | Freq: Once | INTRAMUSCULAR | Status: AC
  Administered 2023-02-23: 25 mg via INTRAVENOUS
  Filled 2023-02-23: qty 1

## 2023-02-23 MED ORDER — SODIUM CHLORIDE 0.9 % IV SOLN
10.0000 mg | Freq: Once | INTRAVENOUS | Status: AC
  Administered 2023-02-23: 10 mg via INTRAVENOUS
  Filled 2023-02-23: qty 10

## 2023-02-23 MED ORDER — PALONOSETRON HCL INJECTION 0.25 MG/5ML
0.2500 mg | Freq: Once | INTRAVENOUS | Status: AC
  Administered 2023-02-23: 0.25 mg via INTRAVENOUS
  Filled 2023-02-23: qty 5

## 2023-02-23 MED ORDER — SODIUM CHLORIDE 0.9 % IV SOLN
80.0000 mg/m2 | Freq: Once | INTRAVENOUS | Status: AC
  Administered 2023-02-23: 192 mg via INTRAVENOUS
  Filled 2023-02-23: qty 32

## 2023-02-23 MED ORDER — HEPARIN SOD (PORK) LOCK FLUSH 100 UNIT/ML IV SOLN
500.0000 [IU] | Freq: Once | INTRAVENOUS | Status: AC | PRN
  Administered 2023-02-23: 500 [IU]
  Filled 2023-02-23: qty 5

## 2023-02-23 MED ORDER — FAMOTIDINE IN NACL 20-0.9 MG/50ML-% IV SOLN
20.0000 mg | Freq: Once | INTRAVENOUS | Status: AC
  Administered 2023-02-23: 20 mg via INTRAVENOUS
  Filled 2023-02-23: qty 50

## 2023-02-23 MED ORDER — SODIUM CHLORIDE 0.9 % IV SOLN
162.6000 mg | Freq: Once | INTRAVENOUS | Status: AC
  Administered 2023-02-23: 160 mg via INTRAVENOUS
  Filled 2023-02-23: qty 16

## 2023-02-23 MED ORDER — SODIUM CHLORIDE 0.9 % IV SOLN
Freq: Once | INTRAVENOUS | Status: AC
  Filled 2023-02-23: qty 250

## 2023-02-23 NOTE — Progress Notes (Signed)
Patient notified this RN regarding developing itchiness and a rash that began two days after her last treatment of carbo and taxol. Pt claiming rash is the worst on her back but generalized and spreading. [name], MD notified and came to infusion chair to assess patient. [name], MD ordered pt prescription cream to apply on skin for itching and to proceed with treatment today. Pt educated about new topical prescription and to notify us of any other side effects or symptoms. Pt verified understanding and was stable at discharge.

## 2023-02-23 NOTE — Patient Instructions (Signed)
[facility]  Discharge Instructions: Thank you for choosing [facility] to provide your oncology and hematology care.  If you have a lab appointment with the Cancer Center, please go directly to the Cancer Center and check in at the registration area.  Wear comfortable clothing and clothing appropriate for easy access to any Portacath or PICC line.   We strive to give you quality time with your provider. You may need to reschedule your appointment if you arrive late (15 or more minutes).  Arriving late affects you and other patients whose appointments are after yours.  Also, if you miss three or more appointments without notifying the office, you may be dismissed from the clinic at the provider's discretion.      For prescription refill requests, have your pharmacy contact our office and allow 72 hours for refills to be completed.    Today you received the following chemotherapy and/or immunotherapy agents taxol and carboplatin      To help prevent nausea and vomiting after your treatment, we encourage you to take your nausea medication as directed.  BELOW ARE SYMPTOMS THAT SHOULD BE REPORTED IMMEDIATELY: *FEVER GREATER THAN 100.4 F (38 C) OR HIGHER *CHILLS OR SWEATING *NAUSEA AND VOMITING THAT IS NOT CONTROLLED WITH YOUR NAUSEA MEDICATION *UNUSUAL SHORTNESS OF BREATH *UNUSUAL BRUISING OR BLEEDING *URINARY PROBLEMS (pain or burning when urinating, or frequent urination) *BOWEL PROBLEMS (unusual diarrhea, constipation, pain near the anus) TENDERNESS IN MOUTH AND THROAT WITH OR WITHOUT PRESENCE OF ULCERS (sore throat, sores in mouth, or a toothache) UNUSUAL RASH, SWELLING OR PAIN  UNUSUAL VAGINAL DISCHARGE OR ITCHING   Items with * indicate a potential emergency and should be followed up as soon as possible or go to the Emergency Department if any problems should occur.  Please show the CHEMOTHERAPY ALERT CARD or IMMUNOTHERAPY ALERT CARD at  check-in to the Emergency Department and triage nurse.  Should you have questions after your visit or need to cancel or reschedule your appointment, please contact [facility]  [phone] and follow the prompts.  Office hours are 8:00 a.m. to 4:30 p.m. Monday - Friday. Please note that voicemails left after 4:00 p.m. may not be returned until the following business day.  We are closed weekends and major holidays. You have access to a nurse at all times for urgent questions. Please call the main number to the clinic [phone] and follow the prompts.  For any non-urgent questions, you may also contact your provider using MyChart. We now offer e-Visits for anyone 68 and older to request care online for non-urgent symptoms. For details visit [email or web address].   Also download the MyChart app! Go to the app store, search "MyChart", open the app, select [facility], and log in with your MyChart username and password.

## 2023-02-26 ENCOUNTER — Telehealth: Payer: Self-pay | Admitting: *Deleted

## 2023-02-26 ENCOUNTER — Encounter: Payer: Self-pay | Admitting: Oncology

## 2023-02-26 NOTE — Telephone Encounter (Signed)
Called [facility] radiology scheduling again to inquire about the breast MRI.   The scheduler stayed on the line while someone looked in the system for the order and did have me re-fax the order while on hold.   The order then must go to the MRI team to get patient scheduled.

## 2023-03-01 MED FILL — Dexamethasone Sodium Phosphate Inj 100 MG/10ML: INTRAMUSCULAR | Qty: 1 | Status: AC

## 2023-03-02 ENCOUNTER — Inpatient Hospital Stay: Payer: Medicare HMO

## 2023-03-02 ENCOUNTER — Encounter: Payer: Self-pay | Admitting: Oncology

## 2023-03-02 ENCOUNTER — Inpatient Hospital Stay: Payer: Medicare HMO | Admitting: Oncology

## 2023-03-02 VITALS — BP 134/65 | HR 78

## 2023-03-02 VITALS — BP 134/79 | HR 95 | Temp 97.0°F | Resp 18 | Wt 266.0 lb

## 2023-03-02 DIAGNOSIS — L270 Generalized skin eruption due to drugs and medicaments taken internally: Secondary | ICD-10-CM | POA: Diagnosis not present

## 2023-03-02 DIAGNOSIS — Z171 Estrogen receptor negative status [ER-]: Secondary | ICD-10-CM

## 2023-03-02 DIAGNOSIS — C50412 Malignant neoplasm of upper-outer quadrant of left female breast: Secondary | ICD-10-CM | POA: Diagnosis not present

## 2023-03-02 DIAGNOSIS — Z5111 Encounter for antineoplastic chemotherapy: Secondary | ICD-10-CM

## 2023-03-02 LAB — CBC WITH DIFFERENTIAL (CANCER CENTER ONLY)
Abs Immature Granulocytes: 0.04 K/uL (ref 0.00–0.07)
Basophils Absolute: 0 K/uL (ref 0.0–0.1)
Basophils Relative: 1 %
Eosinophils Absolute: 0 K/uL (ref 0.0–0.5)
Eosinophils Relative: 0 %
HCT: 35.5 % — ABNORMAL LOW (ref 36.0–46.0)
Hemoglobin: 11.2 g/dL — ABNORMAL LOW (ref 12.0–15.0)
Immature Granulocytes: 1 %
Lymphocytes Relative: 35 %
Lymphs Abs: 1.2 K/uL (ref 0.7–4.0)
MCH: 27.7 pg (ref 26.0–34.0)
MCHC: 31.5 g/dL (ref 30.0–36.0)
MCV: 87.9 fL (ref 80.0–100.0)
Monocytes Absolute: 0.1 K/uL (ref 0.1–1.0)
Monocytes Relative: 4 %
Neutro Abs: 2 K/uL (ref 1.7–7.7)
Neutrophils Relative %: 59 %
Platelet Count: 155 K/uL (ref 150–400)
RBC: 4.04 MIL/uL (ref 3.87–5.11)
RDW: 14.4 % (ref 11.5–15.5)
Smear Review: ADEQUATE
WBC Count: 3.5 K/uL — ABNORMAL LOW (ref 4.0–10.5)
nRBC: 0 % (ref 0.0–0.2)

## 2023-03-02 LAB — CMP (CANCER CENTER ONLY)
ALT: 21 U/L (ref 0–44)
AST: 24 U/L (ref 15–41)
Albumin: 3.2 g/dL — ABNORMAL LOW (ref 3.5–5.0)
Alkaline Phosphatase: 70 U/L (ref 38–126)
Anion gap: 6 (ref 5–15)
BUN: 18 mg/dL (ref 8–23)
CO2: 28 mmol/L (ref 22–32)
Calcium: 8.2 mg/dL — ABNORMAL LOW (ref 8.9–10.3)
Chloride: 103 mmol/L (ref 98–111)
Creatinine: 1.06 mg/dL — ABNORMAL HIGH (ref 0.44–1.00)
GFR, Estimated: 54 mL/min — ABNORMAL LOW (ref >60)
Glucose, Bld: 167 mg/dL — ABNORMAL HIGH (ref 70–99)
Potassium: 3.7 mmol/L (ref 3.5–5.1)
Sodium: 137 mmol/L (ref 135–145)
Total Bilirubin: 1 mg/dL (ref 0.3–1.2)
Total Protein: 6.8 g/dL (ref 6.5–8.1)

## 2023-03-02 MED ORDER — SODIUM CHLORIDE 0.9 % IV SOLN
Freq: Once | INTRAVENOUS | Status: AC
  Filled 2023-03-02: qty 250

## 2023-03-02 MED ORDER — SODIUM CHLORIDE 0.9 % IV SOLN
162.6000 mg | Freq: Once | INTRAVENOUS | Status: AC
  Administered 2023-03-02: 160 mg via INTRAVENOUS
  Filled 2023-03-02: qty 16

## 2023-03-02 MED ORDER — FAMOTIDINE IN NACL 20-0.9 MG/50ML-% IV SOLN
20.0000 mg | Freq: Once | INTRAVENOUS | Status: AC
  Administered 2023-03-02: 20 mg via INTRAVENOUS
  Filled 2023-03-02: qty 50

## 2023-03-02 MED ORDER — HEPARIN SOD (PORK) LOCK FLUSH 100 UNIT/ML IV SOLN
500.0000 [IU] | Freq: Once | INTRAVENOUS | Status: AC | PRN
  Administered 2023-03-02: 500 [IU]
  Filled 2023-03-02: qty 5

## 2023-03-02 MED ORDER — SODIUM CHLORIDE 0.9 % IV SOLN
10.0000 mg | Freq: Once | INTRAVENOUS | Status: AC
  Administered 2023-03-02: 10 mg via INTRAVENOUS
  Filled 2023-03-02: qty 10

## 2023-03-02 MED ORDER — PREDNISONE 5 MG PO TABS: ORAL_TABLET | ORAL | 0 refills | Status: DC

## 2023-03-02 MED ORDER — DIPHENHYDRAMINE HCL 50 MG/ML IJ SOLN
25.0000 mg | Freq: Once | INTRAMUSCULAR | Status: AC
  Administered 2023-03-02: 25 mg via INTRAVENOUS
  Filled 2023-03-02: qty 1

## 2023-03-02 MED ORDER — PALONOSETRON HCL INJECTION 0.25 MG/5ML
0.2500 mg | Freq: Once | INTRAVENOUS | Status: AC
  Administered 2023-03-02: 0.25 mg via INTRAVENOUS
  Filled 2023-03-02: qty 5

## 2023-03-02 MED ORDER — SODIUM CHLORIDE 0.9% FLUSH
10.0000 mL | INTRAVENOUS | Status: DC | PRN
  Administered 2023-03-02: 10 mL via INTRAVENOUS
  Filled 2023-03-02: qty 10

## 2023-03-02 MED ORDER — SODIUM CHLORIDE 0.9 % IV SOLN
80.0000 mg/m2 | Freq: Once | INTRAVENOUS | Status: AC
  Administered 2023-03-02: 192 mg via INTRAVENOUS
  Filled 2023-03-02: qty 32

## 2023-03-02 MED ORDER — SODIUM CHLORIDE 0.9% FLUSH
10.0000 mL | INTRAVENOUS | Status: DC | PRN
  Administered 2023-03-02: 10 mL
  Filled 2023-03-02: qty 10

## 2023-03-02 NOTE — Progress Notes (Signed)
Hematology/Oncology Consult note [facility]  [phone] [phone]  Patient Care Team: Rayetta Humphrey, MD as PCP - General (Family Medicine) Hulen Luster, RN as Oncology Nurse Navigator Creig Hines, MD as Consulting Physician (Oncology) Carmina Miller, MD as Consulting Physician (Radiation Oncology)   Name of the patient: Jennifer Gregory  [id number]  Feb 08, 1946   Date of visit: 03/02/23  Diagnosis- stage II triple negative left breast cancer   Chief complaint/ Reason for visit-on treatment assessment prior to cycle 2-day 8 of neoadjuvant CarboTaxol chemotherapy  Heme/Onc history: Patient is a 77 year old female who self palpated a mass in the left breast since July 2024.  This was followed by a diagnostic mammogram and ultrasound.  Mammogram showed 3.4 x 1.8 x 3.5 cm mass at the 3 o'clock position of the left breast.  There was also an intraductal mass located at the 12 o'clock position of the right breast.  No axillary adenopathy was noted.  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  Right breast mass showed fragments of epithelial proliferation with atypia differential diagnosis includes intraductal papilloma with at least atypical ductal hyperplasia.   Patient has a history of heart failure with reduced ejection fraction and atrial fibrillation for which she follows up with Dr. Darin Engels from cardiology.  Plan is to proceed with 12 weeks of weekly CarboTaxol chemotherapy along with Keytruda.  Anthracycline will not be given    Interval history-patient received CarboTaxol chemotherapy cycle 2 day 1 last week.  She reports that her rash over her bilateral forearms is worse.  There is more erythema involving her hands which makes it difficult for her to use warm water.  She feels more fatigued.  ECOG PS-  2 Pain scale- 0   Review of systems- Review of Systems  Constitutional:  Positive for malaise/fatigue. Negative for chills, fever and weight loss.  HENT:  Negative for congestion, ear discharge and nosebleeds.   Eyes:  Negative for blurred vision.  Respiratory:  Negative for cough, hemoptysis, sputum production, shortness of breath and wheezing.   Cardiovascular:  Negative for chest pain, palpitations, orthopnea and claudication.  Gastrointestinal:  Negative for abdominal pain, blood in stool, constipation, diarrhea, heartburn, melena, nausea and vomiting.  Genitourinary:  Negative for dysuria, flank pain, frequency, hematuria and urgency.  Musculoskeletal:  Negative for back pain, joint pain and myalgias.  Skin:  Positive for rash.  Neurological:  Negative for dizziness, tingling, focal weakness, seizures, weakness and headaches.  Endo/Heme/Allergies:  Does not bruise/bleed easily.  Psychiatric/Behavioral:  Negative for depression and suicidal ideas. The patient does not have insomnia.       Allergies  Allergen Reactions   Aspirin Other (See Comments)    Bleeding risk since patient is on xarelta   Ciprofloxacin Other (See Comments)    Other Reaction: RASH ITCHING    Codeine Other (See Comments)   Diazepam Other (See Comments) and Nausea And Vomiting   Dofetilide Other (See Comments)    Other Reaction: arrhythmia    Hydrocodone-Acetaminophen Other (See Comments) and Nausea And Vomiting   Morphine Nausea And Vomiting   Niacin Other (See Comments)    fatigue   Oxycodone-Acetaminophen Other (See Comments) and Nausea And Vomiting   Sotalol Other (See Comments)    Other Reaction: Prolonged QTc    Amiodarone Other (See Comments)  Affecting liver    Pyridoxine Other (See Comments)    Splitting headache   Sulfamethoxazole-Trimethoprim Swelling   Other Rash    EKG sticker electrodes extended time     Past Medical History:  Diagnosis Date   Atrial fibrillation (HCC)     Atrial fibrillation with RVR (HCC)    Breast cancer, left (HCC) 11/2022   CHF (congestive heart failure) (HCC)    Complication of anesthesia    respiratory depression; needs to sit up immediately after   Coronary artery disease    Edema of both lower extremities    GERD (gastroesophageal reflux disease)    Hypertension, essential    Hyperthyroidism    Morbid obesity with BMI of 45.0-49.9, adult (HCC)    Pneumonia    Pneumonia due to COVID-19 virus    Sick sinus syndrome (HCC)    Sleep apnea    Type 2 diabetes mellitus without complication (HCC)    Vitamin D deficiency      Past Surgical History:  Procedure Laterality Date   ABSCESS DRAINAGE     BREAST BIOPSY Left 01/15/2023   [place] Bx Left Ribbon - Path pending   breast biopsy Right 01/15/2023   [place] Bx Right Coil Clip - Path Pending   BREAST BIOPSY Right 01/15/2023   [place] RT BREAST BX W LOC DEV 1ST LESION IMG BX SPEC US GUIDE 01/15/2023 [facility]   BREAST BIOPSY Left 01/15/2023   [place] LT BREAST BX W LOC DEV 1ST LESION IMG BX SPEC US GUIDE 01/15/2023 [facility]   CARDIAC CATHETERIZATION     CARDIAC PACEMAKER PLACEMENT  06/26/2008   St. Jude dual chamber   CESAREAN SECTION     x 3   CHOLECYSTECTOMY     PACEMAKER GENERATOR CHANGE  03/28/2016   PORTACATH PLACEMENT Right 01/31/2023   Procedure: INSERTION PORT-A-CATH;  Surgeon: Carolan Shiver, MD;  Location: [facility];  Service: General;  Laterality: Right;   TOTAL ABDOMINAL HYSTERECTOMY     TUBAL LIGATION      Social History   Socioeconomic History   Marital status: Single    Spouse name: Not on file   Number of children: 3   Years of education: Not on file   Highest education level: Not on file  Occupational History   Not on file  Tobacco Use   Smoking status: Never   Smokeless tobacco: Never  Vaping Use   Vaping status: Never Used  Substance and Sexual Activity   Alcohol use: Not Currently   Drug use: Never   Sexual activity: Not Currently  Other  Topics Concern   Not on file  Social History Narrative   Not on file   Social Determinants of Health   Financial Resource Strain: Low Risk  (09/06/2022)   Received from [facility]   Overall Financial Resource Strain (CARDIA)    Difficulty of Paying Living Expenses: Not hard at all  Food Insecurity: No Food Insecurity (01/19/2023)   Hunger Vital Sign    Worried About Running Out of Food in the Last Year: Never true    Ran Out of Food in the Last Year: Never true  Transportation Needs: No Transportation Needs (01/19/2023)   PRAPARE - Administrator, Civil Service (Medical): No    Lack of Transportation (Non-Medical): No  Physical Activity: Not on file  Stress: Not on file  Social Connections: Not on file  Intimate Partner Violence: Not At Risk (01/19/2023)   Humiliation, Afraid, Rape, and Kick  questionnaire    Fear of Current or Ex-Partner: No    Emotionally Abused: No    Physically Abused: No    Sexually Abused: No    Family History  Problem Relation Age of Onset   Mesothelioma Father      Current Outpatient Medications:    citalopram (CELEXA) 10 MG tablet, Take 10 mg by mouth daily., Disp: , Rfl:    dexamethasone (DECADRON) 4 MG tablet, Take 2 tablets daily for 2 days, start the day after chemotherapy. Take with food., Disp: 30 tablet, Rfl: 1   lidocaine-prilocaine (EMLA) cream, Apply to affected area once, Disp: 30 g, Rfl: 3   metoprolol succinate (TOPROL-XL) 25 MG 24 hr tablet, Take 12.5 mg by mouth daily., Disp: , Rfl:    Multiple Vitamins-Minerals (CENTRUM SILVER 50+WOMEN PO), Take 1 tablet by mouth daily., Disp: , Rfl:    ondansetron (ZOFRAN) 4 MG tablet, Take 1 tablet (4 mg total) by mouth daily as needed for nausea or vomiting., Disp: 30 tablet, Rfl: 1   potassium chloride (KLOR-CON) 10 MEQ tablet, Take 30 mEq by mouth 2 (two) times daily. Am and 4 pm, Disp: , Rfl:    prochlorperazine (COMPAZINE) 10 MG tablet, Take 1 tablet (10 mg total) by  mouth every 6 (six) hours as needed for nausea or vomiting., Disp: 30 tablet, Rfl: 1   rivaroxaban (XARELTO) 20 MG TABS tablet, Take 20 mg by mouth every evening., Disp: , Rfl:    torsemide (DEMADEX) 20 MG tablet, Take 40 mg by mouth daily in the afternoon. 4 pm, Disp: , Rfl:    triamcinolone ointment (KENALOG) 0.5 %, Apply 1 Application topically 2 (two) times daily as needed (itching from rash)., Disp: 30 g, Rfl: 1   nitroGLYCERIN (NITROSTAT) 0.4 MG SL tablet, Place 0.4 mg under the tongue every 5 (five) minutes as needed for chest pain. (Patient not taking: Reported on 02/08/2023), Disp: , Rfl:    ondansetron (ZOFRAN) 8 MG tablet, Take 1 tablet (8 mg total) by mouth every 8 (eight) hours as needed for nausea or vomiting. Start on the third day after chemotherapy. (Patient not taking: Reported on 02/08/2023), Disp: 30 tablet, Rfl: 1 No current facility-administered medications for this visit.  Facility-Administered Medications Ordered in Other Visits:    sodium chloride flush (NS) 0.9 % injection 10 mL, 10 mL, Intravenous, PRN, Creig Hines, MD, 10 mL at 03/02/23 0272  Physical exam:  Vitals:   03/02/23 0852  BP: 134/79  Pulse: 95  Resp: 18  Temp: (!) 97 F (36.1 C)  TempSrc: Tympanic  SpO2: 98%  Weight: 266 lb (120.7 kg)   Physical Exam Constitutional:      Comments: Ambulates with a walker and appears in no acute distress  Cardiovascular:     Rate and Rhythm: Normal rate and regular rhythm.     Heart sounds: Normal heart sounds.  Pulmonary:     Effort: Pulmonary effort is normal.     Breath sounds: Normal breath sounds.  Abdominal:     General: Bowel sounds are normal.     Palpations: Abdomen is soft.  Skin:    Comments: Erythematous maculopapular rash noted over bilateral forearms  Neurological:     Mental Status: She is alert and oriented to person, place, and time.         Latest Ref Rng & Units 03/02/2023    8:24 AM  CMP  Glucose 70 - 99 mg/dL 536   BUN 8 - 23  mg/dL  18   Creatinine 0.44 - 1.00 mg/dL 4.09   Sodium 811 - 914 mmol/L 137   Potassium 3.5 - 5.1 mmol/L 3.7   Chloride 98 - 111 mmol/L 103   CO2 22 - 32 mmol/L 28   Calcium 8.9 - 10.3 mg/dL 8.2   Total Protein 6.5 - 8.1 g/dL 6.8   Total Bilirubin 0.3 - 1.2 mg/dL 1.0   Alkaline Phos 38 - 126 U/L 70   AST 15 - 41 U/L 24   ALT 0 - 44 U/L 21       Latest Ref Rng & Units 03/02/2023    8:24 AM  CBC  WBC 4.0 - 10.5 K/uL 3.5   Hemoglobin 12.0 - 15.0 g/dL 78.2   Hematocrit 95.6 - 46.0 % 35.5   Platelets 150 - 400 K/uL 155     No images are attached to the encounter.  No results found.   Assessment and plan- Patient is a 77 y.o. female with invasive mammary carcinoma of the left breast stage II cT2 N0 M0 triple negative.  She is here for on treatment assessment prior to cycle 2-day 8 of CarboTaxol chemotherapy  Counts okay to proceed with cycle 2-day 8 of CarboTaxol chemotherapy today.  She will receive cycle 2-day 15 next week along with growth factor support and I will see her back in 2 weeks for cycle 3-day 1 of CarboTaxol Keytruda chemotherapy  Drug-induced skin rash: Likely secondary to [facility].  Topical steroids are not helpful I will give her a brief course of steroid taper with prednisone for 2 weeks starting at 50 mg and tapering by 10 mg every other day.  Based on her response to rash I will decide if we can continue with Keytruda moving forward   Visit Diagnosis 1. Malignant neoplasm of upper-outer quadrant of left breast in female, estrogen receptor negative (HCC)   2. Encounter for antineoplastic chemotherapy   3. Drug-induced skin rash      Dr. Owens Shark, MD, MPH [facility] [phone] 03/02/2023 9:09 AM

## 2023-03-02 NOTE — Progress Notes (Signed)
Patient here for oncology follow-up appointment, concerns of worsening rash on arms

## 2023-03-03 ENCOUNTER — Other Ambulatory Visit: Payer: Self-pay

## 2023-03-05 ENCOUNTER — Encounter: Payer: Self-pay | Admitting: Oncology

## 2023-03-08 MED FILL — Dexamethasone Sodium Phosphate Inj 100 MG/10ML: INTRAMUSCULAR | Qty: 1 | Status: AC

## 2023-03-09 ENCOUNTER — Inpatient Hospital Stay (HOSPITAL_BASED_OUTPATIENT_CLINIC_OR_DEPARTMENT_OTHER): Payer: Medicare HMO | Admitting: Hospice and Palliative Medicine

## 2023-03-09 ENCOUNTER — Inpatient Hospital Stay: Payer: Medicare HMO

## 2023-03-09 ENCOUNTER — Other Ambulatory Visit: Payer: Self-pay

## 2023-03-09 ENCOUNTER — Other Ambulatory Visit: Payer: Self-pay | Admitting: Oncology

## 2023-03-09 ENCOUNTER — Encounter: Payer: Self-pay | Admitting: Oncology

## 2023-03-09 ENCOUNTER — Other Ambulatory Visit: Payer: Self-pay | Admitting: *Deleted

## 2023-03-09 ENCOUNTER — Ambulatory Visit: Payer: Medicare HMO | Admitting: Oncology

## 2023-03-09 VITALS — BP 142/76 | HR 99 | Temp 97.6°F | Resp 18

## 2023-03-09 DIAGNOSIS — R197 Diarrhea, unspecified: Secondary | ICD-10-CM

## 2023-03-09 DIAGNOSIS — C50412 Malignant neoplasm of upper-outer quadrant of left female breast: Secondary | ICD-10-CM

## 2023-03-09 DIAGNOSIS — Z171 Estrogen receptor negative status [ER-]: Secondary | ICD-10-CM | POA: Diagnosis not present

## 2023-03-09 DIAGNOSIS — Z5111 Encounter for antineoplastic chemotherapy: Secondary | ICD-10-CM | POA: Diagnosis not present

## 2023-03-09 DIAGNOSIS — Z95828 Presence of other vascular implants and grafts: Secondary | ICD-10-CM

## 2023-03-09 DIAGNOSIS — L270 Generalized skin eruption due to drugs and medicaments taken internally: Secondary | ICD-10-CM | POA: Diagnosis not present

## 2023-03-09 LAB — CBC WITH DIFFERENTIAL/PLATELET
Abs Immature Granulocytes: 0.04 10*3/uL (ref 0.00–0.07)
Basophils Absolute: 0 10*3/uL (ref 0.0–0.1)
Basophils Relative: 1 %
Eosinophils Absolute: 0 10*3/uL (ref 0.0–0.5)
Eosinophils Relative: 0 %
HCT: 33.2 % — ABNORMAL LOW (ref 36.0–46.0)
Hemoglobin: 10.3 g/dL — ABNORMAL LOW (ref 12.0–15.0)
Immature Granulocytes: 1 %
Lymphocytes Relative: 41 %
Lymphs Abs: 1.2 10*3/uL (ref 0.7–4.0)
MCH: 27.6 pg (ref 26.0–34.0)
MCHC: 31 g/dL (ref 30.0–36.0)
MCV: 89 fL (ref 80.0–100.0)
Monocytes Absolute: 0.1 10*3/uL (ref 0.1–1.0)
Monocytes Relative: 4 %
Neutro Abs: 1.5 10*3/uL — ABNORMAL LOW (ref 1.7–7.7)
Neutrophils Relative %: 53 %
Platelets: 231 10*3/uL (ref 150–400)
RBC: 3.73 MIL/uL — ABNORMAL LOW (ref 3.87–5.11)
RDW: 14.6 % (ref 11.5–15.5)
WBC: 2.8 10*3/uL — ABNORMAL LOW (ref 4.0–10.5)
nRBC: 0 % (ref 0.0–0.2)

## 2023-03-09 LAB — COMPREHENSIVE METABOLIC PANEL
ALT: 57 U/L — ABNORMAL HIGH (ref 0–44)
AST: 49 U/L — ABNORMAL HIGH (ref 15–41)
Albumin: 2.9 g/dL — ABNORMAL LOW (ref 3.5–5.0)
Alkaline Phosphatase: 105 U/L (ref 38–126)
Anion gap: 7 (ref 5–15)
BUN: 12 mg/dL (ref 8–23)
CO2: 27 mmol/L (ref 22–32)
Calcium: 8.3 mg/dL — ABNORMAL LOW (ref 8.9–10.3)
Chloride: 103 mmol/L (ref 98–111)
Creatinine, Ser: 0.92 mg/dL (ref 0.44–1.00)
GFR, Estimated: >60 mL/min (ref >60)
Glucose, Bld: 160 mg/dL — ABNORMAL HIGH (ref 70–99)
Potassium: 3.9 mmol/L (ref 3.5–5.1)
Sodium: 137 mmol/L (ref 135–145)
Total Bilirubin: 1.2 mg/dL (ref 0.3–1.2)
Total Protein: 6.3 g/dL — ABNORMAL LOW (ref 6.5–8.1)

## 2023-03-09 MED ORDER — DIPHENOXYLATE-ATROPINE 2.5-0.025 MG PO TABS: 1.0000 | ORAL_TABLET | Freq: Four times a day (QID) | ORAL | 0 refills | Status: AC | PRN

## 2023-03-09 MED ORDER — HEPARIN SOD (PORK) LOCK FLUSH 100 UNIT/ML IV SOLN
500.0000 [IU] | Freq: Once | INTRAVENOUS | Status: AC
  Administered 2023-03-09: 500 [IU] via INTRAVENOUS
  Filled 2023-03-09: qty 5

## 2023-03-09 NOTE — Progress Notes (Signed)
Pt here for treatment, continues with rash,finished prednisone, cream not helping, increased diarrhea, weak and shaky, almost fell twice. Dr [name] aware, no treatment today, will be assessed by [facility]

## 2023-03-09 NOTE — Progress Notes (Signed)
Patient and her daughter provided with verbal and written instructions for dermatology apt today at 130 and directions. RN fax pt's records to Arin L. Roseanne Kaufman, MD

## 2023-03-09 NOTE — Progress Notes (Signed)
Symptom Management Clinic [facility] at [facility] Telephone:(336) [phone] Fax:(336) [phone]  Patient Care Team: Rayetta Humphrey, MD as PCP - General (Family Medicine) Hulen Luster, RN as Oncology Nurse Navigator Creig Hines, MD as Consulting Physician (Oncology) Carmina Miller, MD as Consulting Physician (Radiation Oncology)   NAME OF PATIENT: Jennifer Gregory  [id number]  [birth year]   DATE OF VISIT: 03/09/23  REASON FOR CONSULT: Tanaja Yarish is a 77 y.o. 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   INTERVAL HISTORY: Patient saw Dr. [name] on 03/02/2023 prior to cycle 2, day 8 of neoadjuvant CarboTaxol chemotherapy.  Patient developed a widespread erythematous rash thought likely secondary to [facility].  She was initially tried on topical steroids without improvement.  Patient was then started on 2-week prednisone taper.  Patient presents [facility] today for evaluation of rash.  Patient reports that she did not have any improvement on steroids.  Feels like rash has worsened on her hands and arms.  Also has spread to chest and legs.  Reports rash is painful has some peeling on her hands.  Denies ulceration.  No oral lesions.  Patient also endorses persistent nonbloody diarrhea.  She is having 4-5 diarrheal stools a day.  Taking Imodium but not finding significant improvement.  Denies any neurologic complaints. Denies recent fevers or illnesses. Denies any easy bleeding or bruising. Reports fair appetite and denies weight loss. Denies chest pain. Denies urinary complaints. Patient offers no further specific complaints today.   PAST MEDICAL HISTORY: Past Medical History:  Diagnosis Date   Atrial fibrillation [facility])    Atrial fibrillation with RVR (HCC)    Breast cancer, left (HCC) 11/2022   CHF (congestive heart failure) (HCC)    Complication of anesthesia    respiratory depression; needs to sit up immediately  after   Coronary artery disease    Edema of both lower extremities    GERD (gastroesophageal reflux disease)    Hypertension, essential    Hyperthyroidism    Morbid obesity with BMI of 45.0-49.9, adult (HCC)    Pneumonia    Pneumonia due to COVID-19 virus    Sick sinus syndrome (HCC)    Sleep apnea    Type 2 diabetes mellitus without complication (HCC)    Vitamin D deficiency     PAST SURGICAL HISTORY:  Past Surgical History:  Procedure Laterality Date   ABSCESS DRAINAGE     BREAST BIOPSY Left 01/15/2023   [place] Bx Left Ribbon - Path pending   breast biopsy Right 01/15/2023   [place] Bx Right Coil Clip - Path Pending   BREAST BIOPSY Right 01/15/2023   [place] RT BREAST BX W LOC DEV 1ST LESION IMG BX SPEC US GUIDE 01/15/2023 [facility]   BREAST BIOPSY Left 01/15/2023   [place] LT BREAST BX W LOC DEV 1ST LESION IMG BX SPEC US GUIDE 01/15/2023 [facility]   CARDIAC CATHETERIZATION     CARDIAC PACEMAKER PLACEMENT  06/26/2008   St. Jude dual chamber   CESAREAN SECTION     x 3   CHOLECYSTECTOMY     PACEMAKER GENERATOR CHANGE  03/28/2016   PORTACATH PLACEMENT Right 01/31/2023   Procedure: INSERTION PORT-A-CATH;  Surgeon: Carolan Shiver, MD;  Location: [facility] ORS;  Service: General;  Laterality: Right;   TOTAL ABDOMINAL HYSTERECTOMY     TUBAL LIGATION      HEMATOLOGY/ONCOLOGY HISTORY:  Oncology History  Malignant neoplasm of upper-outer quadrant of left breast in female, estrogen receptor  negative (HCC)  01/19/2023 Initial Diagnosis   Malignant neoplasm of upper-outer quadrant of left breast in female, estrogen receptor negative (HCC)   01/19/2023 Cancer Staging   Staging form: Breast, AJCC 8th Edition - Clinical stage from 01/19/2023: Stage IIB (cT2, cN0, cM0, G3, ER-, PR-, HER2-) - Signed by Creig Hines, MD on 01/19/2023 Stage prefix: Initial diagnosis Histologic grading system: 3 grade system   02/02/2023 -  Chemotherapy   Patient is on Treatment Plan : BREAST Pembrolizumab  (200) D1 + Carboplatin (1.5) weekly + Paclitaxel (80) weekly      Genetic Testing   Negative genetic testing. No pathogenic variants identified on the [facility] Common Hereditary Cancers+RNA panel. The report date is 01/27/2023.  The Common Hereditary Cancers Panel + RNA offered by [facility] includes sequencing and/or deletion duplication testing of the following 48 genes: APC*, ATM*, AXIN2, BAP1, BARD1, BMPR1A, BRCA1, BRCA2, BRIP1, CDH1, CDK4, CDKN2A (p14ARF), CDKN2A (p16INK4a), CHEK2, CTNNA1, DICER1*, EPCAM*, FH*, GREM1*, HOXB13, KIT, MBD4, MEN1*, MLH1*, MSH2*, MSH3*, MSH6*, MUTYH, NF1*, NTHL1, PALB2, PDGFRA, PMS2*, POLD1*, POLE, PTEN*, RAD51C, RAD51D, SDHA*, SDHB, SDHC*, SDHD, SMAD4, SMARCA4, STK11, TP53, TSC1*, TSC2, VHL.      ALLERGIES:  is allergic to aspirin, ciprofloxacin, codeine, diazepam, dofetilide, hydrocodone-acetaminophen, morphine, niacin, oxycodone-acetaminophen, sotalol, amiodarone, pyridoxine, sulfamethoxazole-trimethoprim, and other.  MEDICATIONS:  Current Outpatient Medications  Medication Sig Dispense Refill   citalopram (CELEXA) 10 MG tablet Take 10 mg by mouth daily.     dexamethasone (DECADRON) 4 MG tablet Take 2 tablets daily for 2 days, start the day after chemotherapy. Take with food. 30 tablet 1   lidocaine-prilocaine (EMLA) cream Apply to affected area once 30 g 3   metoprolol succinate (TOPROL-XL) 25 MG 24 hr tablet Take 12.5 mg by mouth daily.     Multiple Vitamins-Minerals (CENTRUM SILVER 50+WOMEN PO) Take 1 tablet by mouth daily.     ondansetron (ZOFRAN) 4 MG tablet Take 1 tablet (4 mg total) by mouth daily as needed for nausea or vomiting. 30 tablet 1   potassium chloride (KLOR-CON) 10 MEQ tablet Take 30 mEq by mouth 2 (two) times daily. Am and 4 pm     prochlorperazine (COMPAZINE) 10 MG tablet Take 1 tablet (10 mg total) by mouth every 6 (six) hours as needed for nausea or vomiting. 30 tablet 1   rivaroxaban (XARELTO) 20 MG TABS tablet Take 20 mg by mouth every  evening.     torsemide (DEMADEX) 20 MG tablet Take 40 mg by mouth daily in the afternoon. 4 pm     triamcinolone ointment (KENALOG) 0.5 % Apply 1 Application topically 2 (two) times daily as needed (itching from rash). 30 g 1   nitroGLYCERIN (NITROSTAT) 0.4 MG SL tablet Place 0.4 mg under the tongue every 5 (five) minutes as needed for chest pain. (Patient not taking: Reported on 02/08/2023)     ondansetron (ZOFRAN) 8 MG tablet Take 1 tablet (8 mg total) by mouth every 8 (eight) hours as needed for nausea or vomiting. Start on the third day after chemotherapy. (Patient not taking: Reported on 02/08/2023) 30 tablet 1   No current facility-administered medications for this visit.    VITAL SIGNS: BP (!) 142/76   Pulse 99   Temp 97.6 F (36.4 C) (Tympanic)   Resp 18   SpO2 100%  There were no vitals filed for this visit.  Estimated body mass index is 45.66 kg/m as calculated from the following:   Height as of 02/20/23: 5\' 4"  (1.626 m).   Weight  as of 03/02/23: 266 lb (120.7 kg).  LABS: CBC:    Component Value Date/Time   WBC 2.8 (L) 03/09/2023 0924   HGB 10.3 (L) 03/09/2023 0924   HGB 11.2 (L) 03/02/2023 0824   HGB 13.2 08/03/2011 2143   HCT 33.2 (L) 03/09/2023 0924   HCT 41.4 08/03/2011 2143   PLT 231 03/09/2023 0924   PLT 155 03/02/2023 0824   PLT 185 08/03/2011 2143   MCV 89.0 03/09/2023 0924   MCV 87 08/03/2011 2143   NEUTROABS 1.5 (L) 03/09/2023 0924   LYMPHSABS 1.2 03/09/2023 0924   MONOABS 0.1 03/09/2023 0924   EOSABS 0.0 03/09/2023 0924   BASOSABS 0.0 03/09/2023 0924   Comprehensive Metabolic Panel:    Component Value Date/Time   NA 137 03/09/2023 0924   NA 144 08/03/2011 2143   K 3.9 03/09/2023 0924   K 3.6 08/03/2011 2143   CL 103 03/09/2023 0924   CL 101 08/03/2011 2143   CO2 27 03/09/2023 0924   CO2 33 (H) 08/03/2011 2143   BUN 12 03/09/2023 0924   BUN 15 08/03/2011 2143   CREATININE 0.92 03/09/2023 0924   CREATININE 1.06 (H) 03/02/2023 0824   CREATININE  1.10 08/03/2011 2143   GLUCOSE 160 (H) 03/09/2023 0924   GLUCOSE 100 (H) 08/03/2011 2143   CALCIUM 8.3 (L) 03/09/2023 0924   CALCIUM 8.5 08/03/2011 2143   AST 49 (H) 03/09/2023 0924   AST 24 03/02/2023 0824   ALT 57 (H) 03/09/2023 0924   ALT 21 03/02/2023 0824   ALT 45 08/03/2011 2143   ALKPHOS 105 03/09/2023 0924   ALKPHOS 86 08/03/2011 2143   BILITOT 1.2 03/09/2023 0924   BILITOT 1.0 03/02/2023 0824   PROT 6.3 (L) 03/09/2023 0924   PROT 7.6 08/03/2011 2143   ALBUMIN 2.9 (L) 03/09/2023 0924   ALBUMIN 3.1 (L) 08/03/2011 2143    RADIOGRAPHIC STUDIES: No results found.  PERFORMANCE STATUS (ECOG) : 1 - Symptomatic but completely ambulatory  Review of Systems Unless otherwise noted, a complete review of systems is negative.  Physical Exam General: NAD Cardiovascular: regular rate and rhythm Pulmonary: clear ant fields Abdomen: soft, nontender, + bowel sounds GU: no suprapubic tenderness Extremities: no edema, no joint deformities Skin: no rashes Neurological: Weakness but otherwise nonfocal  IMPRESSION/PLAN: Stage II triple negative breast cancer -previously on neoadjuvant CarboTaxol Keytruda.  Rande Lawman was held recently due to new onset of rash.  Patient also having diarrhea, likely secondary to chemotherapy.  Dr. [name] was holding treatment again today.  Rash -likely secondary to immunotherapy.  Patient did not have significant improvement on prednisone taper.  Discussed with Dr. [name] who recommended patient see a dermatologist for further evaluation and treatment. [facility] and Dr. Roseanne Kaufman agreed to see patient this afternoon.  Diarrhea -likely secondary to chemotherapy but will send for stool studies to rule out infectious etiologies.  Diarrhea could also be secondary to immunotherapy.  Suspect that dermatology will restart steroids.  Will send Rx for Lomotil. Consider GI referral if no improvement.   Case and plan discussed with Dr. [name]  Patient  expressed understanding and was in agreement with this plan. She also understands that She can call clinic at any time with any questions, concerns, or complaints.   Thank you for allowing me to participate in the care of this very pleasant patient.   Time Total: 25 minutes  Visit consisted of counseling and education dealing with the complex and emotionally intense issues of symptom management in the  setting of serious illness.Greater than 50%  of this time was spent counseling and coordinating care related to the above assessment and plan.  Signed by: Laurette Schimke, PhD, NP-C

## 2023-03-09 NOTE — Patient Instructions (Signed)
You have an appointment today for further work up on your rash with Arin L. Isenstein, MD at 130 pm.  [facility] [address] [place], [place] [address] [phone]  Please bring your insurance card with you to this appointment.

## 2023-03-12 ENCOUNTER — Encounter: Payer: Self-pay | Admitting: *Deleted

## 2023-03-12 ENCOUNTER — Encounter: Payer: Self-pay | Admitting: Oncology

## 2023-03-12 ENCOUNTER — Inpatient Hospital Stay: Payer: Medicare HMO

## 2023-03-12 DIAGNOSIS — Z171 Estrogen receptor negative status [ER-]: Secondary | ICD-10-CM

## 2023-03-12 NOTE — Progress Notes (Signed)
Called [facility] again to inquire about breast MRI.   Order had been resent on 10/7.    [facility] sent one of their orders to be filled out on 10/10 because our order had [place] as preferred imaging location.   This order was faxed back to them at [phone] with fax confirmation received at 10:26 am.   I spoke to [place] at [facility] radiology and she is sending a message to her supervisor, I was again told not to re-fax the most recent order as it could "mess things up".    Discussed with Dr. [name] and she said if not scheduled this time could hold off until after chemo or even consider contrast mammogram in [place].

## 2023-03-13 ENCOUNTER — Encounter: Payer: Self-pay | Admitting: Oncology

## 2023-03-13 ENCOUNTER — Inpatient Hospital Stay: Payer: Medicare HMO

## 2023-03-14 ENCOUNTER — Inpatient Hospital Stay: Payer: Medicare HMO

## 2023-03-15 ENCOUNTER — Encounter: Payer: Self-pay | Admitting: Oncology

## 2023-03-15 ENCOUNTER — Encounter: Payer: Self-pay | Admitting: *Deleted

## 2023-03-15 ENCOUNTER — Other Ambulatory Visit: Payer: Self-pay | Admitting: *Deleted

## 2023-03-15 DIAGNOSIS — Z171 Estrogen receptor negative status [ER-]: Secondary | ICD-10-CM

## 2023-03-15 NOTE — Progress Notes (Signed)
Jennifer Gregory called concerned about chemo appt being moved to Mondays.   After some investigation and pharmacy approval, appt was able to be moved back to Fridays.   She will come in tomorrow at 8:15, details given to her.

## 2023-03-16 ENCOUNTER — Inpatient Hospital Stay: Payer: Medicare HMO

## 2023-03-16 ENCOUNTER — Inpatient Hospital Stay (HOSPITAL_BASED_OUTPATIENT_CLINIC_OR_DEPARTMENT_OTHER): Payer: Medicare HMO | Admitting: Oncology

## 2023-03-16 ENCOUNTER — Encounter: Payer: Self-pay | Admitting: Oncology

## 2023-03-16 VITALS — BP 117/62 | HR 90 | Temp 97.8°F | Wt 262.0 lb

## 2023-03-16 DIAGNOSIS — L270 Generalized skin eruption due to drugs and medicaments taken internally: Secondary | ICD-10-CM

## 2023-03-16 DIAGNOSIS — C50412 Malignant neoplasm of upper-outer quadrant of left female breast: Secondary | ICD-10-CM | POA: Diagnosis not present

## 2023-03-16 DIAGNOSIS — Z5111 Encounter for antineoplastic chemotherapy: Secondary | ICD-10-CM | POA: Diagnosis not present

## 2023-03-16 DIAGNOSIS — R197 Diarrhea, unspecified: Secondary | ICD-10-CM

## 2023-03-16 DIAGNOSIS — Z171 Estrogen receptor negative status [ER-]: Secondary | ICD-10-CM | POA: Diagnosis not present

## 2023-03-16 LAB — CBC WITH DIFFERENTIAL/PLATELET
Abs Immature Granulocytes: 0.1 10*3/uL — ABNORMAL HIGH (ref 0.00–0.07)
Basophils Absolute: 0.1 10*3/uL (ref 0.0–0.1)
Basophils Relative: 1 %
Eosinophils Absolute: 0 10*3/uL (ref 0.0–0.5)
Eosinophils Relative: 0 %
HCT: 34.8 % — ABNORMAL LOW (ref 36.0–46.0)
Hemoglobin: 10.7 g/dL — ABNORMAL LOW (ref 12.0–15.0)
Immature Granulocytes: 2 %
Lymphocytes Relative: 31 %
Lymphs Abs: 1.5 10*3/uL (ref 0.7–4.0)
MCH: 27.3 pg (ref 26.0–34.0)
MCHC: 30.7 g/dL (ref 30.0–36.0)
MCV: 88.8 fL (ref 80.0–100.0)
Monocytes Absolute: 1 10*3/uL (ref 0.1–1.0)
Monocytes Relative: 20 %
Neutro Abs: 2.2 10*3/uL (ref 1.7–7.7)
Neutrophils Relative %: 46 %
Platelets: 170 10*3/uL (ref 150–400)
RBC: 3.92 MIL/uL (ref 3.87–5.11)
RDW: 16 % — ABNORMAL HIGH (ref 11.5–15.5)
WBC: 4.9 10*3/uL (ref 4.0–10.5)
nRBC: 0 % (ref 0.0–0.2)

## 2023-03-16 LAB — COMPREHENSIVE METABOLIC PANEL
ALT: 18 U/L (ref 0–44)
AST: 22 U/L (ref 15–41)
Albumin: 2.9 g/dL — ABNORMAL LOW (ref 3.5–5.0)
Alkaline Phosphatase: 97 U/L (ref 38–126)
Anion gap: 8 (ref 5–15)
BUN: 12 mg/dL (ref 8–23)
CO2: 29 mmol/L (ref 22–32)
Calcium: 8.3 mg/dL — ABNORMAL LOW (ref 8.9–10.3)
Chloride: 99 mmol/L (ref 98–111)
Creatinine, Ser: 1.01 mg/dL — ABNORMAL HIGH (ref 0.44–1.00)
GFR, Estimated: 57 mL/min — ABNORMAL LOW (ref >60)
Glucose, Bld: 147 mg/dL — ABNORMAL HIGH (ref 70–99)
Potassium: 3.3 mmol/L — ABNORMAL LOW (ref 3.5–5.1)
Sodium: 136 mmol/L (ref 135–145)
Total Bilirubin: 1.3 mg/dL — ABNORMAL HIGH (ref 0.3–1.2)
Total Protein: 6.4 g/dL — ABNORMAL LOW (ref 6.5–8.1)

## 2023-03-16 LAB — MAGNESIUM: Magnesium: 1.7 mg/dL (ref 1.7–2.4)

## 2023-03-16 MED ORDER — DEXAMETHASONE SODIUM PHOSPHATE 10 MG/ML IJ SOLN
10.0000 mg | Freq: Once | INTRAMUSCULAR | Status: AC
Start: 2023-03-16 — End: 2023-03-16
  Administered 2023-03-16: 10 mg via INTRAVENOUS
  Filled 2023-03-16: qty 1

## 2023-03-16 MED ORDER — DIPHENHYDRAMINE HCL 50 MG/ML IJ SOLN
25.0000 mg | Freq: Once | INTRAMUSCULAR | Status: AC
  Administered 2023-03-16: 25 mg via INTRAVENOUS
  Filled 2023-03-16: qty 1

## 2023-03-16 MED ORDER — FAMOTIDINE IN NACL 20-0.9 MG/50ML-% IV SOLN
20.0000 mg | Freq: Once | INTRAVENOUS | Status: AC
  Administered 2023-03-16: 20 mg via INTRAVENOUS
  Filled 2023-03-16: qty 50

## 2023-03-16 MED ORDER — SODIUM CHLORIDE 0.9 % IV SOLN
Freq: Once | INTRAVENOUS | Status: AC
  Filled 2023-03-16: qty 250

## 2023-03-16 MED ORDER — HEPARIN SOD (PORK) LOCK FLUSH 100 UNIT/ML IV SOLN
500.0000 [IU] | Freq: Once | INTRAVENOUS | Status: AC | PRN
  Administered 2023-03-16: 500 [IU]
  Filled 2023-03-16: qty 5

## 2023-03-16 MED ORDER — CARBOPLATIN CHEMO INJECTION 450 MG/45ML
162.6000 mg | Freq: Once | INTRAVENOUS | Status: AC
  Administered 2023-03-16: 160 mg via INTRAVENOUS
  Filled 2023-03-16: qty 16

## 2023-03-16 MED ORDER — SODIUM CHLORIDE 0.9 % IV SOLN
80.0000 mg/m2 | Freq: Once | INTRAVENOUS | Status: AC
  Administered 2023-03-16: 192 mg via INTRAVENOUS
  Filled 2023-03-16 (×3): qty 32

## 2023-03-16 MED ORDER — PALONOSETRON HCL INJECTION 0.25 MG/5ML
0.2500 mg | Freq: Once | INTRAVENOUS | Status: AC
  Administered 2023-03-16: 0.25 mg via INTRAVENOUS
  Filled 2023-03-16: qty 5

## 2023-03-16 MED ORDER — SODIUM CHLORIDE 0.9 % IV SOLN
10.0000 mg | Freq: Once | INTRAVENOUS | Status: DC
Start: 2023-03-16 — End: 2023-03-16

## 2023-03-16 NOTE — Patient Instructions (Signed)

## 2023-03-16 NOTE — Progress Notes (Unsigned)
Patient is feeling weak and fatigued. Plus she doesn't want to have the radiation so just have the surgery for a lumpectomy.

## 2023-03-17 ENCOUNTER — Encounter: Payer: Self-pay | Admitting: Oncology

## 2023-03-17 NOTE — Progress Notes (Signed)
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  This was followed by a diagnostic mammogram and ultrasound.  Mammogram showed 3.4 x 1.8 x 3.5 cm mass at the 3 o'clock position of the left breast.  There was also an intraductal mass located at the 12 o'clock position of the right breast.  No axillary adenopathy was noted.  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  Right breast mass showed fragments of epithelial proliferation with atypia differential diagnosis includes intraductal papilloma with at least atypical ductal hyperplasia.   Patient has a history of heart failure with reduced ejection fraction and atrial fibrillation for which she follows up with Dr. Darin Engels from cardiology.  Plan is to proceed with 12 weeks of weekly CarboTaxol chemotherapy along with Keytruda.  Anthracycline will not be given.  Patient could not tolerate Keytruda as evidenced by diarrhea and significant skin rash and was therefore stopped after 2 cycles  Interval history-skin rash over bilateral forearms and hands is gradually getting better.  She was seen  by dermatology and was prescribed topical steroids.  She is not presently on any oral steroids  ECOG PS- 2 Pain scale- 0 Opioid associated constipation- no  Review of systems- Review of Systems  Constitutional:  Negative for chills, fever, malaise/fatigue and weight loss.  HENT:  Negative for congestion, ear discharge and nosebleeds.   Eyes:  Negative for blurred vision.  Respiratory:  Negative for cough, hemoptysis, sputum production, shortness of breath and wheezing.   Cardiovascular:  Negative for chest pain, palpitations, orthopnea and claudication.  Gastrointestinal:  Negative for abdominal pain, blood in stool, constipation, diarrhea, heartburn, melena, nausea and vomiting.  Genitourinary:  Negative for dysuria, flank pain, frequency, hematuria and urgency.  Musculoskeletal:  Negative for back pain, joint pain and myalgias.  Skin:  Positive for rash.  Neurological:  Negative for dizziness, tingling, focal weakness, seizures, weakness and headaches.  Endo/Heme/Allergies:  Does not bruise/bleed easily.  Psychiatric/Behavioral:  Negative for depression and suicidal ideas. The patient does not have insomnia.       Allergies  Allergen Reactions   Aspirin Other (See Comments)    Bleeding risk since patient is on xarelta   Ciprofloxacin Other (See Comments)    Other Reaction: RASH ITCHING    Codeine Other (See Comments)   Diazepam Other (See Comments) and Nausea And Vomiting   Dofetilide Other (See Comments)    Other Reaction: arrhythmia    Hydrocodone-Acetaminophen Other (See Comments) and Nausea And Vomiting   Morphine Nausea And Vomiting   Niacin Other (See Comments)    fatigue   Oxycodone-Acetaminophen Other (See Comments) and Nausea And Vomiting   Sotalol Other (See Comments)  Other Reaction: Prolonged QTc    Amiodarone Other (See Comments)    Affecting liver    Pyridoxine Other (See Comments)    Splitting headache   Sulfamethoxazole-Trimethoprim Swelling   Other  Rash    EKG sticker electrodes extended time     Past Medical History:  Diagnosis Date   Atrial fibrillation (HCC)    Atrial fibrillation with RVR (HCC)    Breast cancer, left (HCC) 11/2022   CHF (congestive heart failure) (HCC)    Complication of anesthesia    respiratory depression; needs to sit up immediately after   Coronary artery disease    Edema of both lower extremities    GERD (gastroesophageal reflux disease)    Hypertension, essential    Hyperthyroidism    Morbid obesity with BMI of 45.0-49.9, adult (HCC)    Pneumonia    Pneumonia due to COVID-19 virus    Sick sinus syndrome (HCC)    Sleep apnea    Type 2 diabetes mellitus without complication (HCC)    Vitamin D deficiency      Past Surgical History:  Procedure Laterality Date   ABSCESS DRAINAGE     BREAST BIOPSY Left 01/15/2023   [place] Bx Left Ribbon - Path pending   breast biopsy Right 01/15/2023   [place] Bx Right Coil Clip - Path Pending   BREAST BIOPSY Right 01/15/2023   [place] RT BREAST BX W LOC DEV 1ST LESION IMG BX SPEC US GUIDE 01/15/2023 [facility]   BREAST BIOPSY Left 01/15/2023   [place] LT BREAST BX W LOC DEV 1ST LESION IMG BX SPEC US GUIDE 01/15/2023 [facility]   CARDIAC CATHETERIZATION     CARDIAC PACEMAKER PLACEMENT  06/26/2008   St. Jude dual chamber   CESAREAN SECTION     x 3   CHOLECYSTECTOMY     PACEMAKER GENERATOR CHANGE  03/28/2016   PORTACATH PLACEMENT Right 01/31/2023   Procedure: INSERTION PORT-A-CATH;  Surgeon: Carolan Shiver, MD;  Location: [facility] ORS;  Service: General;  Laterality: Right;   TOTAL ABDOMINAL HYSTERECTOMY     TUBAL LIGATION      Social History   Socioeconomic History   Marital status: Single    Spouse name: Not on file   Number of children: 3   Years of education: Not on file   Highest education level: Not on file  Occupational History   Not on file  Tobacco Use   Smoking status: Never   Smokeless tobacco: Never  Vaping Use   Vaping status: Never Used   Substance and Sexual Activity   Alcohol use: Not Currently   Drug use: Never   Sexual activity: Not Currently  Other Topics Concern   Not on file  Social History Narrative   Not on file   Social Determinants of Health   Financial Resource Strain: Low Risk  (09/06/2022)   Received from [facility]   Overall Financial Resource Strain (CARDIA)    Difficulty of Paying Living Expenses: Not hard at all  Food Insecurity: No Food Insecurity (01/19/2023)   Hunger Vital Sign    Worried About Running Out of Food in the Last Year: Never true    Ran Out of Food in the Last Year: Never true  Transportation Needs: No Transportation Needs (01/19/2023)   PRAPARE - Administrator, Civil Service (Medical): No    Lack of Transportation (Non-Medical): No  Physical Activity: Not on file  Stress: Not on file  Social Connections: Not on file  Intimate Partner Violence: Not At Risk (01/19/2023)   Humiliation, Afraid, Rape, and Kick questionnaire    Fear of Current or Ex-Partner: No    Emotionally Abused: No    Physically Abused: No    Sexually Abused: No    Family History  Problem Relation Age of Onset   Mesothelioma Father      Current Outpatient Medications:    citalopram (CELEXA) 10 MG tablet, Take 10 mg by mouth daily., Disp: , Rfl:    dexamethasone (DECADRON) 4 MG tablet, Take 2 tablets daily for 2 days, start the day after chemotherapy. Take with food., Disp: 30 tablet, Rfl: 1   diphenoxylate-atropine (LOMOTIL) 2.5-0.025 MG tablet, Take 1 tablet by mouth 4 (four) times daily as needed for diarrhea or loose stools., Disp: 30 tablet, Rfl: 0   lidocaine-prilocaine (EMLA) cream, Apply to affected area once, Disp: 30 g, Rfl: 3   metoprolol succinate (TOPROL-XL) 25 MG 24 hr tablet, Take 12.5 mg by mouth daily., Disp: , Rfl:    Multiple Vitamins-Minerals (CENTRUM SILVER 50+WOMEN PO), Take 1 tablet by mouth daily., Disp: , Rfl:    ondansetron (ZOFRAN) 4 MG tablet, Take  1 tablet (4 mg total) by mouth daily as needed for nausea or vomiting., Disp: 30 tablet, Rfl: 1   potassium chloride (KLOR-CON) 10 MEQ tablet, Take 30 mEq by mouth 2 (two) times daily. Am and 4 pm, Disp: , Rfl:    prochlorperazine (COMPAZINE) 10 MG tablet, Take 1 tablet (10 mg total) by mouth every 6 (six) hours as needed for nausea or vomiting., Disp: 30 tablet, Rfl: 1   rivaroxaban (XARELTO) 20 MG TABS tablet, Take 20 mg by mouth every evening., Disp: , Rfl:    torsemide (DEMADEX) 20 MG tablet, Take 40 mg by mouth daily in the afternoon. 4 pm, Disp: , Rfl:    triamcinolone ointment (KENALOG) 0.5 %, Apply 1 Application topically 2 (two) times daily as needed (itching from rash)., Disp: 30 g, Rfl: 1   nitroGLYCERIN (NITROSTAT) 0.4 MG SL tablet, Place 0.4 mg under the tongue every 5 (five) minutes as needed for chest pain. (Patient not taking: Reported on 02/08/2023), Disp: , Rfl:    ondansetron (ZOFRAN) 8 MG tablet, Take 1 tablet (8 mg total) by mouth every 8 (eight) hours as needed for nausea or vomiting. Start on the third day after chemotherapy. (Patient not taking: Reported on 02/08/2023), Disp: 30 tablet, Rfl: 1  Physical exam:  Vitals:   03/16/23 0850  BP: 117/62  Pulse: 90  Temp: 97.8 F (36.6 C)  TempSrc: Tympanic  SpO2: (!) 89%  Weight: 262 lb (118.8 kg)   Physical Exam Constitutional:      Comments: Ambulates with a walker.  Appears in no acute distress  Cardiovascular:     Rate and Rhythm: Normal rate and regular rhythm.     Heart sounds: Normal heart sounds.  Pulmonary:     Effort: Pulmonary effort is normal.     Breath sounds: Normal breath sounds.  Abdominal:     General: Bowel sounds are normal.     Palpations: Abdomen is soft.  Skin:    General: Skin is warm and dry.     Comments: Bilateral erythematous maculopapular rash over forearms and hands appears to be improving  Neurological:     Mental Status: She is alert and oriented to person, place, and time.          Latest Ref Rng & Units 03/16/2023    8:17 AM  CMP  Glucose 70 - 99 mg/dL 409   BUN 8 - 23 mg/dL 12   Creatinine 8.11 - 1.00 mg/dL 9.14   Sodium 782 - 956 mmol/L 136   Potassium 3.5 - 5.1 mmol/L 3.3   Chloride 98 - 111 mmol/L 99   CO2 22 - 32 mmol/L 29   Calcium 8.9 - 10.3 mg/dL 8.3   Total Protein 6.5 - 8.1 g/dL 6.4   Total Bilirubin 0.3 - 1.2 mg/dL 1.3   Alkaline Phos 38 - 126 U/L 97   AST 15 - 41 U/L 22   ALT 0 - 44 U/L 18       Latest Ref Rng & Units 03/16/2023    8:17 AM  CBC  WBC 4.0 - 10.5 K/uL 4.9   Hemoglobin 12.0 - 15.0 g/dL 21.3   Hematocrit 08.6 - 46.0 % 34.8   Platelets 150 - 400 K/uL 170      Assessment and plan- Patient is a 77 y.o. 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  She will directly proceed for cycle 7 next week and I will see her back in 2 weeks for cycle 8.  I am eliminating Keytruda from her regimen altogether given that she developed a severe skin rash as well as some mild diarrhea.  Plan is to complete 12 weekly cycles of CarboTaxol chemotherapy if tolerated followed by surgery  Patient will not require Neupogen for this week.  I will consider Neupogen for next cycle if her counts titrate downwards.  Keytruda induced skin rash: Continue topical steroids   Visit Diagnosis 1. Malignant neoplasm of upper-outer quadrant of left breast in female, estrogen receptor negative (HCC)   2. Encounter for antineoplastic chemotherapy   3. Drug-induced skin rash      Dr. Owens Shark, MD, MPH [facility] [phone] 03/17/2023 4:43 PM

## 2023-03-19 ENCOUNTER — Inpatient Hospital Stay: Payer: Medicare HMO | Admitting: Oncology

## 2023-03-19 ENCOUNTER — Inpatient Hospital Stay: Payer: Medicare HMO

## 2023-03-21 ENCOUNTER — Encounter: Payer: Self-pay | Admitting: Dermatology

## 2023-03-22 ENCOUNTER — Encounter: Payer: Self-pay | Admitting: Oncology

## 2023-03-23 ENCOUNTER — Ambulatory Visit: Payer: Medicare HMO

## 2023-03-23 ENCOUNTER — Ambulatory Visit: Payer: Medicare HMO | Admitting: Oncology

## 2023-03-23 ENCOUNTER — Inpatient Hospital Stay: Payer: Medicare HMO | Attending: Oncology

## 2023-03-23 ENCOUNTER — Inpatient Hospital Stay: Payer: Medicare HMO

## 2023-03-23 VITALS — BP 148/73 | HR 89 | Temp 97.8°F | Resp 17 | Wt 264.3 lb

## 2023-03-23 DIAGNOSIS — Z5111 Encounter for antineoplastic chemotherapy: Secondary | ICD-10-CM | POA: Diagnosis present

## 2023-03-23 DIAGNOSIS — Z7901 Long term (current) use of anticoagulants: Secondary | ICD-10-CM | POA: Diagnosis not present

## 2023-03-23 DIAGNOSIS — I5022 Chronic systolic (congestive) heart failure: Secondary | ICD-10-CM | POA: Diagnosis not present

## 2023-03-23 DIAGNOSIS — I110 Hypertensive heart disease with heart failure: Secondary | ICD-10-CM | POA: Diagnosis not present

## 2023-03-23 DIAGNOSIS — R21 Rash and other nonspecific skin eruption: Secondary | ICD-10-CM | POA: Diagnosis not present

## 2023-03-23 DIAGNOSIS — T451X5A Adverse effect of antineoplastic and immunosuppressive drugs, initial encounter: Secondary | ICD-10-CM | POA: Diagnosis not present

## 2023-03-23 DIAGNOSIS — D701 Agranulocytosis secondary to cancer chemotherapy: Secondary | ICD-10-CM | POA: Diagnosis not present

## 2023-03-23 DIAGNOSIS — Z79899 Other long term (current) drug therapy: Secondary | ICD-10-CM | POA: Insufficient documentation

## 2023-03-23 DIAGNOSIS — C50412 Malignant neoplasm of upper-outer quadrant of left female breast: Secondary | ICD-10-CM | POA: Diagnosis present

## 2023-03-23 DIAGNOSIS — Z171 Estrogen receptor negative status [ER-]: Secondary | ICD-10-CM | POA: Diagnosis not present

## 2023-03-23 DIAGNOSIS — I4891 Unspecified atrial fibrillation: Secondary | ICD-10-CM | POA: Diagnosis not present

## 2023-03-23 LAB — CBC WITH DIFFERENTIAL (CANCER CENTER ONLY)
Abs Immature Granulocytes: 0.05 10*3/uL (ref 0.00–0.07)
Basophils Absolute: 0.1 10*3/uL (ref 0.0–0.1)
Basophils Relative: 1 %
Eosinophils Absolute: 0 10*3/uL (ref 0.0–0.5)
Eosinophils Relative: 1 %
HCT: 31.8 % — ABNORMAL LOW (ref 36.0–46.0)
Hemoglobin: 9.7 g/dL — ABNORMAL LOW (ref 12.0–15.0)
Immature Granulocytes: 1 %
Lymphocytes Relative: 25 %
Lymphs Abs: 1.4 10*3/uL (ref 0.7–4.0)
MCH: 27.2 pg (ref 26.0–34.0)
MCHC: 30.5 g/dL (ref 30.0–36.0)
MCV: 89.3 fL (ref 80.0–100.0)
Monocytes Absolute: 0.1 10*3/uL (ref 0.1–1.0)
Monocytes Relative: 2 %
Neutro Abs: 3.8 10*3/uL (ref 1.7–7.7)
Neutrophils Relative %: 70 %
Platelet Count: 154 10*3/uL (ref 150–400)
RBC: 3.56 MIL/uL — ABNORMAL LOW (ref 3.87–5.11)
RDW: 15.5 % (ref 11.5–15.5)
Smear Review: NORMAL
WBC Count: 5.4 10*3/uL (ref 4.0–10.5)
nRBC: 0 % (ref 0.0–0.2)

## 2023-03-23 LAB — CMP (CANCER CENTER ONLY)
ALT: 22 U/L (ref 0–44)
AST: 26 U/L (ref 15–41)
Albumin: 2.9 g/dL — ABNORMAL LOW (ref 3.5–5.0)
Alkaline Phosphatase: 72 U/L (ref 38–126)
Anion gap: 6 (ref 5–15)
BUN: 11 mg/dL (ref 8–23)
CO2: 29 mmol/L (ref 22–32)
Calcium: 8.7 mg/dL — ABNORMAL LOW (ref 8.9–10.3)
Chloride: 102 mmol/L (ref 98–111)
Creatinine: 0.83 mg/dL (ref 0.44–1.00)
GFR, Estimated: >60 mL/min (ref >60)
Glucose, Bld: 146 mg/dL — ABNORMAL HIGH (ref 70–99)
Potassium: 3.9 mmol/L (ref 3.5–5.1)
Sodium: 137 mmol/L (ref 135–145)
Total Bilirubin: 1.3 mg/dL — ABNORMAL HIGH (ref 0.3–1.2)
Total Protein: 6.4 g/dL — ABNORMAL LOW (ref 6.5–8.1)

## 2023-03-23 LAB — TSH: TSH: 3.027 u[IU]/mL (ref 0.350–4.500)

## 2023-03-23 MED ORDER — CARBOPLATIN CHEMO INJECTION 450 MG/45ML
162.6000 mg | Freq: Once | INTRAVENOUS | Status: AC
  Administered 2023-03-23: 160 mg via INTRAVENOUS
  Filled 2023-03-23: qty 16

## 2023-03-23 MED ORDER — SODIUM CHLORIDE 0.9 % IV SOLN
Freq: Once | INTRAVENOUS | Status: AC
Start: 2023-03-23 — End: 2023-03-23
  Filled 2023-03-23: qty 250

## 2023-03-23 MED ORDER — PALONOSETRON HCL INJECTION 0.25 MG/5ML
0.2500 mg | Freq: Once | INTRAVENOUS | Status: AC
  Administered 2023-03-23: 0.25 mg via INTRAVENOUS
  Filled 2023-03-23: qty 5

## 2023-03-23 MED ORDER — SODIUM CHLORIDE 0.9% FLUSH
10.0000 mL | INTRAVENOUS | Status: DC | PRN
  Filled 2023-03-23: qty 10

## 2023-03-23 MED ORDER — FAMOTIDINE IN NACL 20-0.9 MG/50ML-% IV SOLN
20.0000 mg | Freq: Once | INTRAVENOUS | Status: AC
  Administered 2023-03-23: 20 mg via INTRAVENOUS
  Filled 2023-03-23: qty 50

## 2023-03-23 MED ORDER — DIPHENHYDRAMINE HCL 50 MG/ML IJ SOLN
25.0000 mg | Freq: Once | INTRAMUSCULAR | Status: AC
  Administered 2023-03-23: 25 mg via INTRAVENOUS
  Filled 2023-03-23: qty 1

## 2023-03-23 MED ORDER — SODIUM CHLORIDE 0.9% FLUSH
10.0000 mL | Freq: Once | INTRAVENOUS | Status: AC
Start: 2023-03-23 — End: 2023-03-23
  Administered 2023-03-23: 10 mL via INTRAVENOUS
  Filled 2023-03-23: qty 10

## 2023-03-23 MED ORDER — HEPARIN SOD (PORK) LOCK FLUSH 100 UNIT/ML IV SOLN
500.0000 [IU] | Freq: Once | INTRAVENOUS | Status: AC
  Administered 2023-03-23: 500 [IU] via INTRAVENOUS
  Filled 2023-03-23: qty 5

## 2023-03-23 MED ORDER — DEXAMETHASONE SODIUM PHOSPHATE 10 MG/ML IJ SOLN
10.0000 mg | Freq: Once | INTRAMUSCULAR | Status: AC
  Administered 2023-03-23: 10 mg via INTRAVENOUS
  Filled 2023-03-23: qty 1

## 2023-03-23 MED ORDER — SODIUM CHLORIDE 0.9 % IV SOLN
80.0000 mg/m2 | Freq: Once | INTRAVENOUS | Status: AC
  Administered 2023-03-23: 192 mg via INTRAVENOUS
  Filled 2023-03-23: qty 32

## 2023-03-23 NOTE — Patient Instructions (Signed)
[place] [facility] AT [facility] REGIONAL  Discharge Instructions: Thank you for choosing [facility] to provide your oncology and hematology care.  If you have a lab appointment with the Cancer Center, please go directly to the Cancer Center and check in at the registration area.  Wear comfortable clothing and clothing appropriate for easy access to any Portacath or PICC line.   We strive to give you quality time with your provider. You may need to reschedule your appointment if you arrive late (15 or more minutes).  Arriving late affects you and other patients whose appointments are after yours.  Also, if you miss three or more appointments without notifying the office, you may be dismissed from the clinic at the provider's discretion.      For prescription refill requests, have your pharmacy contact our office and allow 72 hours for refills to be completed.    Today you received the following chemotherapy and/or immunotherapy agents: Carbo/ Taxol     To help prevent nausea and vomiting after your treatment, we encourage you to take your nausea medication as directed.  BELOW ARE SYMPTOMS THAT SHOULD BE REPORTED IMMEDIATELY: *FEVER GREATER THAN 100.4 F (38 C) OR HIGHER *CHILLS OR SWEATING *NAUSEA AND VOMITING THAT IS NOT CONTROLLED WITH YOUR NAUSEA MEDICATION *UNUSUAL SHORTNESS OF BREATH *UNUSUAL BRUISING OR BLEEDING *URINARY PROBLEMS (pain or burning when urinating, or frequent urination) *BOWEL PROBLEMS (unusual diarrhea, constipation, pain near the anus) TENDERNESS IN MOUTH AND THROAT WITH OR WITHOUT PRESENCE OF ULCERS (sore throat, sores in mouth, or a toothache) UNUSUAL RASH, SWELLING OR PAIN  UNUSUAL VAGINAL DISCHARGE OR ITCHING   Items with * indicate a potential emergency and should be followed up as soon as possible or go to the Emergency Department if any problems should occur.  Please show the CHEMOTHERAPY ALERT CARD or IMMUNOTHERAPY ALERT CARD at check-in  to the Emergency Department and triage nurse.  Should you have questions after your visit or need to cancel or reschedule your appointment, please contact [place] [facility] AT [facility]  [phone] and follow the prompts.  Office hours are 8:00 a.m. to 4:30 p.m. Monday - Friday. Please note that voicemails left after 4:00 p.m. may not be returned until the following business day.  We are closed weekends and major holidays. You have access to a nurse at all times for urgent questions. Please call the main number to the clinic [phone] and follow the prompts.  For any non-urgent questions, you may also contact your provider using MyChart. We now offer e-Visits for anyone 19 and older to request care online for non-urgent symptoms. For details visit [email or web address].   Also download the MyChart app! Go to the app store, search "MyChart", open the app, select [place], and log in with your MyChart username and password.

## 2023-03-25 LAB — T4: T4, Total: 7.8 ug/dL (ref 4.5–12.0)

## 2023-03-26 ENCOUNTER — Ambulatory Visit: Payer: Medicare HMO | Admitting: Oncology

## 2023-03-26 ENCOUNTER — Other Ambulatory Visit: Payer: Medicare HMO

## 2023-03-26 ENCOUNTER — Ambulatory Visit: Payer: Medicare HMO

## 2023-03-30 ENCOUNTER — Inpatient Hospital Stay: Payer: Medicare HMO

## 2023-03-30 ENCOUNTER — Inpatient Hospital Stay (HOSPITAL_BASED_OUTPATIENT_CLINIC_OR_DEPARTMENT_OTHER): Payer: Medicare HMO | Admitting: Oncology

## 2023-03-30 ENCOUNTER — Encounter: Payer: Self-pay | Admitting: Oncology

## 2023-03-30 VITALS — BP 120/70 | HR 90 | Temp 95.9°F | Wt 260.9 lb

## 2023-03-30 DIAGNOSIS — C50412 Malignant neoplasm of upper-outer quadrant of left female breast: Secondary | ICD-10-CM

## 2023-03-30 DIAGNOSIS — D701 Agranulocytosis secondary to cancer chemotherapy: Secondary | ICD-10-CM | POA: Diagnosis not present

## 2023-03-30 DIAGNOSIS — Z171 Estrogen receptor negative status [ER-]: Secondary | ICD-10-CM | POA: Diagnosis not present

## 2023-03-30 DIAGNOSIS — Z5111 Encounter for antineoplastic chemotherapy: Secondary | ICD-10-CM | POA: Diagnosis not present

## 2023-03-30 DIAGNOSIS — L270 Generalized skin eruption due to drugs and medicaments taken internally: Secondary | ICD-10-CM | POA: Diagnosis not present

## 2023-03-30 DIAGNOSIS — T451X5A Adverse effect of antineoplastic and immunosuppressive drugs, initial encounter: Secondary | ICD-10-CM | POA: Insufficient documentation

## 2023-03-30 LAB — CBC WITH DIFFERENTIAL (CANCER CENTER ONLY)
Abs Immature Granulocytes: 0.04 10*3/uL (ref 0.00–0.07)
Basophils Absolute: 0 10*3/uL (ref 0.0–0.1)
Basophils Relative: 1 %
Eosinophils Absolute: 0 10*3/uL (ref 0.0–0.5)
Eosinophils Relative: 2 %
HCT: 29 % — ABNORMAL LOW (ref 36.0–46.0)
Hemoglobin: 9 g/dL — ABNORMAL LOW (ref 12.0–15.0)
Immature Granulocytes: 2 %
Lymphocytes Relative: 51 %
Lymphs Abs: 1.1 10*3/uL (ref 0.7–4.0)
MCH: 27.7 pg (ref 26.0–34.0)
MCHC: 31 g/dL (ref 30.0–36.0)
MCV: 89.2 fL (ref 80.0–100.0)
Monocytes Absolute: 0.1 10*3/uL (ref 0.1–1.0)
Monocytes Relative: 6 %
Neutro Abs: 0.8 10*3/uL — ABNORMAL LOW (ref 1.7–7.7)
Neutrophils Relative %: 38 %
Platelet Count: 168 10*3/uL (ref 150–400)
RBC: 3.25 MIL/uL — ABNORMAL LOW (ref 3.87–5.11)
RDW: 15.8 % — ABNORMAL HIGH (ref 11.5–15.5)
WBC Count: 2.2 10*3/uL — ABNORMAL LOW (ref 4.0–10.5)
nRBC: 1.4 % — ABNORMAL HIGH (ref 0.0–0.2)

## 2023-03-30 LAB — CMP (CANCER CENTER ONLY)
ALT: 43 U/L (ref 0–44)
AST: 39 U/L (ref 15–41)
Albumin: 2.9 g/dL — ABNORMAL LOW (ref 3.5–5.0)
Alkaline Phosphatase: 98 U/L (ref 38–126)
Anion gap: 7 (ref 5–15)
BUN: 9 mg/dL (ref 8–23)
CO2: 27 mmol/L (ref 22–32)
Calcium: 8.2 mg/dL — ABNORMAL LOW (ref 8.9–10.3)
Chloride: 102 mmol/L (ref 98–111)
Creatinine: 0.92 mg/dL (ref 0.44–1.00)
GFR, Estimated: >60 mL/min (ref >60)
Glucose, Bld: 139 mg/dL — ABNORMAL HIGH (ref 70–99)
Potassium: 3.8 mmol/L (ref 3.5–5.1)
Sodium: 136 mmol/L (ref 135–145)
Total Bilirubin: 1.4 mg/dL — ABNORMAL HIGH (ref <1.2)
Total Protein: 6.5 g/dL (ref 6.5–8.1)

## 2023-03-30 MED ORDER — FILGRASTIM-SNDZ 480 MCG/0.8ML IJ SOSY: 480.0000 ug | PREFILLED_SYRINGE | Freq: Every day | INTRAMUSCULAR | Status: DC

## 2023-03-30 MED ORDER — HEPARIN SOD (PORK) LOCK FLUSH 100 UNIT/ML IV SOLN
500.0000 [IU] | Freq: Once | INTRAVENOUS | Status: DC
  Filled 2023-03-30: qty 5

## 2023-03-30 MED ORDER — FILGRASTIM-SNDZ 480 MCG/0.8ML IJ SOSY
480.0000 ug | PREFILLED_SYRINGE | Freq: Once | INTRAMUSCULAR | Status: AC
  Administered 2023-03-30: 480 ug via SUBCUTANEOUS

## 2023-03-30 MED ORDER — FILGRASTIM-SNDZ 480 MCG/0.8ML IJ SOSY: 480.0000 ug | PREFILLED_SYRINGE | Freq: Once | INTRAMUSCULAR | Status: DC

## 2023-03-30 NOTE — Progress Notes (Signed)
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  This was followed by a diagnostic mammogram and ultrasound.  Mammogram showed 3.4 x 1.8 x 3.5 cm mass at the 3 o'clock position of the left breast.  There was also an intraductal mass located at the 12 o'clock position of the right breast.  No axillary adenopathy was noted.  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  Right breast mass showed fragments of epithelial proliferation with atypia differential diagnosis includes intraductal papilloma with at least atypical ductal hyperplasia.   Patient has a history of heart failure with reduced ejection fraction and atrial fibrillation for which she follows up with Dr. Darin Engels from cardiology.  Plan is to proceed with 12 weeks of weekly CarboTaxol chemotherapy along with Keytruda.  Anthracycline will not be given.  Patient could not tolerate Keytruda as evidenced by diarrhea and significant skin rash and was therefore stopped after 2 cycles  Interval history-skin rash has returned over the last few days and it is intensely pruritic.  She  reports ongoing fatigue  ECOG PS- 2 Pain scale- 0   Review of systems- Review of Systems  Constitutional:  Positive for malaise/fatigue. Negative for chills, fever and weight loss.  HENT:  Negative for congestion, ear discharge and nosebleeds.   Eyes:  Negative for blurred vision.  Respiratory:  Negative for cough, hemoptysis, sputum production, shortness of breath and wheezing.   Cardiovascular:  Negative for chest pain, palpitations, orthopnea and claudication.  Gastrointestinal:  Negative for abdominal pain, blood in stool, constipation, diarrhea, heartburn, melena, nausea and vomiting.  Genitourinary:  Negative for dysuria, flank pain, frequency, hematuria and urgency.  Musculoskeletal:  Negative for back pain, joint pain and myalgias.  Skin:  Positive for rash.  Neurological:  Negative for dizziness, tingling, focal weakness, seizures, weakness and headaches.  Endo/Heme/Allergies:  Does not bruise/bleed easily.  Psychiatric/Behavioral:  Negative for depression and suicidal ideas. The patient does not have insomnia.       Allergies  Allergen Reactions   Aspirin Other (See Comments)    Bleeding risk since patient is on xarelta   Ciprofloxacin Other (See Comments)    Other Reaction: RASH ITCHING    Codeine Other (See Comments)   Diazepam Other (See Comments) and Nausea And Vomiting   Dofetilide Other (See Comments)    Other Reaction: arrhythmia    Hydrocodone-Acetaminophen Other (See Comments) and Nausea And Vomiting   Morphine Nausea And Vomiting   Niacin Other (See Comments)    fatigue   Oxycodone-Acetaminophen Other (See Comments) and Nausea And Vomiting   Sotalol Other (See Comments)    Other Reaction: Prolonged QTc    Amiodarone Other (See Comments)  Affecting liver    Pyridoxine Other (See Comments)    Splitting headache   Sulfamethoxazole-Trimethoprim Swelling   Other Rash    EKG sticker electrodes extended time     Past Medical History:  Diagnosis  Date   Atrial fibrillation (HCC)    Atrial fibrillation with RVR (HCC)    Breast cancer, left (HCC) 11/2022   CHF (congestive heart failure) (HCC)    Complication of anesthesia    respiratory depression; needs to sit up immediately after   Coronary artery disease    Edema of both lower extremities    GERD (gastroesophageal reflux disease)    Hypertension, essential    Hyperthyroidism    Morbid obesity with BMI of 45.0-49.9, adult (HCC)    Pneumonia    Pneumonia due to COVID-19 virus    Sick sinus syndrome (HCC)    Sleep apnea    Type 2 diabetes mellitus without complication (HCC)    Vitamin D deficiency      Past Surgical History:  Procedure Laterality Date   ABSCESS DRAINAGE     BREAST BIOPSY Left 01/15/2023   [place] Bx Left Ribbon - Path pending   breast biopsy Right 01/15/2023   [place] Bx Right Coil Clip - Path Pending   BREAST BIOPSY Right 01/15/2023   [place] RT BREAST BX W LOC DEV 1ST LESION IMG BX SPEC US GUIDE 01/15/2023 [facility]   BREAST BIOPSY Left 01/15/2023   [place] LT BREAST BX W LOC DEV 1ST LESION IMG BX SPEC US GUIDE 01/15/2023 [facility]   CARDIAC CATHETERIZATION     CARDIAC PACEMAKER PLACEMENT  06/26/2008   St. Jude dual chamber   CESAREAN SECTION     x 3   CHOLECYSTECTOMY     PACEMAKER GENERATOR CHANGE  03/28/2016   PORTACATH PLACEMENT Right 01/31/2023   Procedure: INSERTION PORT-A-CATH;  Surgeon: Carolan Shiver, MD;  Location: [facility];  Service: General;  Laterality: Right;   TOTAL ABDOMINAL HYSTERECTOMY     TUBAL LIGATION      Social History   Socioeconomic History   Marital status: Single    Spouse name: Not on file   Number of children: 3   Years of education: Not on file   Highest education level: Not on file  Occupational History   Not on file  Tobacco Use   Smoking status: Never   Smokeless tobacco: Never  Vaping Use   Vaping status: Never Used  Substance and Sexual Activity   Alcohol use: Not Currently   Drug use: Never    Sexual activity: Not Currently  Other Topics Concern   Not on file  Social History Narrative   Not on file   Social Determinants of Health   Financial Resource Strain: Low Risk  (09/06/2022)   Received from [facility]   Overall Financial Resource Strain (CARDIA)    Difficulty of Paying Living Expenses: Not hard at all  Food Insecurity: No Food Insecurity (01/19/2023)   Hunger Vital Sign    Worried About Running Out of Food in the Last Year: Never true    Ran Out of Food in the Last Year: Never true  Transportation Needs: No Transportation Needs (01/19/2023)   PRAPARE - Administrator, Civil Service (Medical): No    Lack of Transportation (Non-Medical): No  Physical Activity: Not on file  Stress: Not on file  Social Connections: Not on file  Intimate Partner Violence: Not At Risk (01/19/2023)   Humiliation, Afraid, Rape, and Kick  questionnaire    Fear of Current or Ex-Partner: No    Emotionally Abused: No    Physically Abused: No    Sexually Abused: No    Family History  Problem Relation Age of Onset   Mesothelioma Father      Current Outpatient Medications:    citalopram (CELEXA) 10 MG tablet, Take 10 mg by mouth daily., Disp: , Rfl:    dexamethasone (DECADRON) 4 MG tablet, Take 2 tablets daily for 2 days, start the day after chemotherapy. Take with food., Disp: 30 tablet, Rfl: 1   diphenoxylate-atropine (LOMOTIL) 2.5-0.025 MG tablet, Take 1 tablet by mouth 4 (four) times daily as needed for diarrhea or loose stools., Disp: 30 tablet, Rfl: 0   lidocaine-prilocaine (EMLA) cream, Apply to affected area once, Disp: 30 g, Rfl: 3   metoprolol succinate (TOPROL-XL) 25 MG 24 hr tablet, Take 12.5 mg by mouth daily., Disp: , Rfl:    Multiple Vitamins-Minerals (CENTRUM SILVER 50+WOMEN PO), Take 1 tablet by mouth daily., Disp: , Rfl:    nitroGLYCERIN (NITROSTAT) 0.4 MG SL tablet, Place 0.4 mg under the tongue every 5 (five) minutes as needed for chest pain.  (Patient not taking: Reported on 02/08/2023), Disp: , Rfl:    ondansetron (ZOFRAN) 4 MG tablet, Take 1 tablet (4 mg total) by mouth daily as needed for nausea or vomiting., Disp: 30 tablet, Rfl: 1   ondansetron (ZOFRAN) 8 MG tablet, Take 1 tablet (8 mg total) by mouth every 8 (eight) hours as needed for nausea or vomiting. Start on the third day after chemotherapy. (Patient not taking: Reported on 02/08/2023), Disp: 30 tablet, Rfl: 1   potassium chloride (KLOR-CON) 10 MEQ tablet, Take 30 mEq by mouth 2 (two) times daily. Am and 4 pm, Disp: , Rfl:    prochlorperazine (COMPAZINE) 10 MG tablet, Take 1 tablet (10 mg total) by mouth every 6 (six) hours as needed for nausea or vomiting., Disp: 30 tablet, Rfl: 1   rivaroxaban (XARELTO) 20 MG TABS tablet, Take 20 mg by mouth every evening., Disp: , Rfl:    torsemide (DEMADEX) 20 MG tablet, Take 40 mg by mouth daily in the afternoon. 4 pm, Disp: , Rfl:    triamcinolone ointment (KENALOG) 0.5 %, Apply 1 Application topically 2 (two) times daily as needed (itching from rash)., Disp: 30 g, Rfl: 1  Current Facility-Administered Medications:    heparin lock flush 100 unit/mL, 500 Units, Intravenous, Once, Creig Hines, MD  Physical exam:  Vitals:   03/30/23 0840  BP: 120/70  Pulse: 90  Temp: (!) 95.9 F (35.5 C)  TempSrc: Tympanic  SpO2: 100%  Weight: 260 lb 14.4 oz (118.3 kg)   Physical Exam Constitutional:      Comments: Ambulates with a walker  Cardiovascular:     Rate and Rhythm: Normal rate and regular rhythm.     Heart sounds: Normal heart sounds.  Pulmonary:     Effort: Pulmonary effort is normal.     Breath sounds: Normal breath sounds.  Skin:    Comments: Erythematous macular rash over bilateral forearms.  Focal areas of superficial desquamation noted  Neurological:     Mental Status: She is alert and oriented to person, place, and time.         Latest Ref Rng & Units 03/30/2023    8:27 AM  CMP  Glucose 70 - 99 mg/dL 161   BUN 8  - 23 mg/dL 9   Creatinine 0.96 - 0.45 mg/dL 4.09  Sodium 135 - 145 mmol/L 136   Potassium 3.5 - 5.1 mmol/L 3.8   Chloride 98 - 111 mmol/L 102   CO2 22 - 32 mmol/L 27   Calcium 8.9 - 10.3 mg/dL 8.2   Total Protein 6.5 - 8.1 g/dL 6.5   Total Bilirubin <8.6 mg/dL 1.4   Alkaline Phos 38 - 126 U/L 98   AST 15 - 41 U/L 39   ALT 0 - 44 U/L 43       Latest Ref Rng & Units 03/30/2023    8:27 AM  CBC  WBC 4.0 - 10.5 K/uL 2.2   Hemoglobin 12.0 - 15.0 g/dL 9.0   Hematocrit 57.8 - 46.0 % 29.0   Platelets 150 - 400 K/uL 168          Assessment and plan- Patient is a 77 y.o. 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  She is here for on treatment assessment prior to cycle 7 of weekly CarboTaxol chemoTherapy  Patient's rash has returned at this time despite stopping Keytruda over 3 weeks ago.  I have asked her to continue topical steroids along with as needed Benadryl for itching.  She did not seem to benefit greatly from oral steroids that we had given her previously.  White cell count is lowToday at 2.2 with an ANC of 0.8.  I am holding chemotherapy today.  It is possible that the skin rash is secondary to Taxol I will switch her from Taxol to Abraxane to see if the rash improves with change in therapy.  She is still due for 6 more cycles of treatment.  We will plan to give her Zarzio for 3 days starting today   Visit Diagnosis 1. Malignant neoplasm of upper-outer quadrant of left breast in female, estrogen receptor negative (HCC)   2. Chemotherapy induced neutropenia (HCC)   3. Drug-induced skin rash      Dr. Owens Shark, MD, MPH [facility] at [facility] [phone] 03/30/2023 12:21 PM

## 2023-04-01 ENCOUNTER — Other Ambulatory Visit: Payer: Self-pay

## 2023-04-02 ENCOUNTER — Encounter: Payer: Self-pay | Admitting: Oncology

## 2023-04-02 ENCOUNTER — Inpatient Hospital Stay: Payer: Medicare HMO

## 2023-04-02 ENCOUNTER — Other Ambulatory Visit: Payer: Medicare HMO

## 2023-04-02 ENCOUNTER — Ambulatory Visit: Payer: Medicare HMO | Admitting: Oncology

## 2023-04-02 ENCOUNTER — Ambulatory Visit: Payer: Medicare HMO

## 2023-04-02 DIAGNOSIS — T451X5A Adverse effect of antineoplastic and immunosuppressive drugs, initial encounter: Secondary | ICD-10-CM

## 2023-04-02 DIAGNOSIS — Z5111 Encounter for antineoplastic chemotherapy: Secondary | ICD-10-CM | POA: Diagnosis not present

## 2023-04-02 MED ORDER — FILGRASTIM-SNDZ 480 MCG/0.8ML IJ SOSY
480.0000 ug | PREFILLED_SYRINGE | Freq: Once | INTRAMUSCULAR | Status: AC
  Administered 2023-04-02: 480 ug via SUBCUTANEOUS
  Filled 2023-04-02: qty 0.8

## 2023-04-02 NOTE — Addendum Note (Signed)
Addended by: Amaryllis Dyke on: 04/02/2023 04:14 PM   Modules accepted: Orders

## 2023-04-03 ENCOUNTER — Inpatient Hospital Stay: Payer: Medicare HMO

## 2023-04-03 ENCOUNTER — Ambulatory Visit: Payer: Medicare HMO

## 2023-04-03 DIAGNOSIS — T451X5A Adverse effect of antineoplastic and immunosuppressive drugs, initial encounter: Secondary | ICD-10-CM

## 2023-04-03 DIAGNOSIS — Z5111 Encounter for antineoplastic chemotherapy: Secondary | ICD-10-CM | POA: Diagnosis not present

## 2023-04-03 MED ORDER — FILGRASTIM-SNDZ 480 MCG/0.8ML IJ SOSY
480.0000 ug | PREFILLED_SYRINGE | Freq: Once | INTRAMUSCULAR | Status: AC
  Administered 2023-04-03: 480 ug via SUBCUTANEOUS
  Filled 2023-04-03: qty 0.8

## 2023-04-04 ENCOUNTER — Ambulatory Visit: Payer: Medicare HMO

## 2023-04-05 ENCOUNTER — Ambulatory Visit: Payer: Medicare HMO

## 2023-04-06 ENCOUNTER — Ambulatory Visit: Payer: Medicare HMO

## 2023-04-06 ENCOUNTER — Other Ambulatory Visit: Payer: Medicare HMO

## 2023-04-06 ENCOUNTER — Inpatient Hospital Stay: Payer: Medicare HMO

## 2023-04-06 ENCOUNTER — Ambulatory Visit: Payer: Medicare HMO | Admitting: Oncology

## 2023-04-06 ENCOUNTER — Inpatient Hospital Stay (HOSPITAL_BASED_OUTPATIENT_CLINIC_OR_DEPARTMENT_OTHER): Payer: Medicare HMO | Admitting: Oncology

## 2023-04-06 ENCOUNTER — Encounter: Payer: Self-pay | Admitting: Oncology

## 2023-04-06 VITALS — BP 121/76 | HR 84 | Temp 97.2°F | Resp 18 | Wt 267.8 lb

## 2023-04-06 DIAGNOSIS — Z171 Estrogen receptor negative status [ER-]: Secondary | ICD-10-CM

## 2023-04-06 DIAGNOSIS — Z5111 Encounter for antineoplastic chemotherapy: Secondary | ICD-10-CM | POA: Diagnosis not present

## 2023-04-06 DIAGNOSIS — C50412 Malignant neoplasm of upper-outer quadrant of left female breast: Secondary | ICD-10-CM | POA: Diagnosis not present

## 2023-04-06 LAB — CBC WITH DIFFERENTIAL (CANCER CENTER ONLY)
Abs Immature Granulocytes: 0.41 10*3/uL — ABNORMAL HIGH (ref 0.00–0.07)
Basophils Absolute: 0.1 10*3/uL (ref 0.0–0.1)
Basophils Relative: 1 %
Eosinophils Absolute: 0 10*3/uL (ref 0.0–0.5)
Eosinophils Relative: 0 %
HCT: 31.5 % — ABNORMAL LOW (ref 36.0–46.0)
Hemoglobin: 9.5 g/dL — ABNORMAL LOW (ref 12.0–15.0)
Immature Granulocytes: 6 %
Lymphocytes Relative: 24 %
Lymphs Abs: 1.5 10*3/uL (ref 0.7–4.0)
MCH: 27.5 pg (ref 26.0–34.0)
MCHC: 30.2 g/dL (ref 30.0–36.0)
MCV: 91.3 fL (ref 80.0–100.0)
Monocytes Absolute: 0.8 10*3/uL (ref 0.1–1.0)
Monocytes Relative: 12 %
Neutro Abs: 3.6 10*3/uL (ref 1.7–7.7)
Neutrophils Relative %: 57 %
Platelet Count: 152 10*3/uL (ref 150–400)
RBC: 3.45 MIL/uL — ABNORMAL LOW (ref 3.87–5.11)
RDW: 17.9 % — ABNORMAL HIGH (ref 11.5–15.5)
Smear Review: NORMAL
WBC Count: 6.4 10*3/uL (ref 4.0–10.5)
nRBC: 0.6 % — ABNORMAL HIGH (ref 0.0–0.2)

## 2023-04-06 LAB — CMP (CANCER CENTER ONLY)
ALT: 17 U/L (ref 0–44)
AST: 26 U/L (ref 15–41)
Albumin: 2.9 g/dL — ABNORMAL LOW (ref 3.5–5.0)
Alkaline Phosphatase: 104 U/L (ref 38–126)
Anion gap: 11 (ref 5–15)
BUN: 16 mg/dL (ref 8–23)
CO2: 27 mmol/L (ref 22–32)
Calcium: 8.6 mg/dL — ABNORMAL LOW (ref 8.9–10.3)
Chloride: 100 mmol/L (ref 98–111)
Creatinine: 0.99 mg/dL (ref 0.44–1.00)
GFR, Estimated: 58 mL/min — ABNORMAL LOW (ref >60)
Glucose, Bld: 144 mg/dL — ABNORMAL HIGH (ref 70–99)
Potassium: 3.5 mmol/L (ref 3.5–5.1)
Sodium: 138 mmol/L (ref 135–145)
Total Bilirubin: 0.9 mg/dL (ref <1.2)
Total Protein: 6.8 g/dL (ref 6.5–8.1)

## 2023-04-06 MED ORDER — DEXAMETHASONE SODIUM PHOSPHATE 10 MG/ML IJ SOLN
10.0000 mg | Freq: Once | INTRAMUSCULAR | Status: AC
  Administered 2023-04-06: 10 mg via INTRAVENOUS
  Filled 2023-04-06: qty 1

## 2023-04-06 MED ORDER — PALONOSETRON HCL INJECTION 0.25 MG/5ML
0.2500 mg | Freq: Once | INTRAVENOUS | Status: AC
  Administered 2023-04-06: 0.25 mg via INTRAVENOUS
  Filled 2023-04-06: qty 5

## 2023-04-06 MED ORDER — SODIUM CHLORIDE 0.9 % IV SOLN
Freq: Once | INTRAVENOUS | Status: AC
  Filled 2023-04-06: qty 250

## 2023-04-06 MED ORDER — SODIUM CHLORIDE 0.9% FLUSH
10.0000 mL | Freq: Once | INTRAVENOUS | Status: AC
  Administered 2023-04-06: 10 mL via INTRAVENOUS
  Filled 2023-04-06: qty 10

## 2023-04-06 MED ORDER — HEPARIN SOD (PORK) LOCK FLUSH 100 UNIT/ML IV SOLN
500.0000 [IU] | Freq: Once | INTRAVENOUS | Status: AC | PRN
  Administered 2023-04-06: 500 [IU]
  Filled 2023-04-06: qty 5

## 2023-04-06 MED ORDER — CARBOPLATIN CHEMO INJECTION 450 MG/45ML
162.6000 mg | Freq: Once | INTRAVENOUS | Status: AC
  Administered 2023-04-06: 160 mg via INTRAVENOUS
  Filled 2023-04-06: qty 16

## 2023-04-06 MED ORDER — PACLITAXEL PROTEIN-BOUND CHEMO INJECTION 100 MG
100.0000 mg/m2 | Freq: Once | INTRAVENOUS | Status: AC
  Administered 2023-04-06: 250 mg via INTRAVENOUS
  Filled 2023-04-06: qty 50

## 2023-04-06 NOTE — Patient Instructions (Signed)
[place] [facility] - A DEPT OF [facility]  Discharge Instructions: Thank you for choosing [facility] [facility] to provide your oncology and hematology care.  If you have a lab appointment with the Cancer Center, please go directly to the Cancer Center and check in at the registration area.  Wear comfortable clothing and clothing appropriate for easy access to any Portacath or PICC line.   We strive to give you quality time with your provider. You may need to reschedule your appointment if you arrive late (15 or more minutes).  Arriving late affects you and other patients whose appointments are after yours.  Also, if you miss three or more appointments without notifying the office, you may be dismissed from the clinic at the provider's discretion.      For prescription refill requests, have your pharmacy contact our office and allow 72 hours for refills to be completed.    Today you received the following chemotherapy and/or immunotherapy agents: Carbo/ Abraxane   To help prevent nausea and vomiting after your treatment, we encourage you to take your nausea medication as directed.  BELOW ARE SYMPTOMS THAT SHOULD BE REPORTED IMMEDIATELY: *FEVER GREATER THAN 100.4 F (38 C) OR HIGHER *CHILLS OR SWEATING *NAUSEA AND VOMITING THAT IS NOT CONTROLLED WITH YOUR NAUSEA MEDICATION *UNUSUAL SHORTNESS OF BREATH *UNUSUAL BRUISING OR BLEEDING *URINARY PROBLEMS (pain or burning when urinating, or frequent urination) *BOWEL PROBLEMS (unusual diarrhea, constipation, pain near the anus) TENDERNESS IN MOUTH AND THROAT WITH OR WITHOUT PRESENCE OF ULCERS (sore throat, sores in mouth, or a toothache) UNUSUAL RASH, SWELLING OR PAIN  UNUSUAL VAGINAL DISCHARGE OR ITCHING   Items with * indicate a potential emergency and should be followed up as soon as possible or go to the Emergency Department if any problems should occur.  Please show the CHEMOTHERAPY ALERT CARD or IMMUNOTHERAPY  ALERT CARD at check-in to the Emergency Department and triage nurse.  Should you have questions after your visit or need to cancel or reschedule your appointment, please contact [place] [facility] - A DEPT OF [facility]  [phone] and follow the prompts.  Office hours are 8:00 a.m. to 4:30 p.m. Monday - Friday. Please note that voicemails left after 4:00 p.m. may not be returned until the following business day.  We are closed weekends and major holidays. You have access to a nurse at all times for urgent questions. Please call the main number to the clinic [phone] and follow the prompts.  For any non-urgent questions, you may also contact your provider using MyChart. We now offer e-Visits for anyone 29 and older to request care online for non-urgent symptoms. For details visit [email or web address].   Also download the MyChart app! Go to the app store, search "MyChart", open the app, select [place], and log in with your MyChart username and password.

## 2023-04-06 NOTE — Progress Notes (Unsigned)
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  This was followed by a diagnostic mammogram and ultrasound.  Mammogram showed 3.4 x 1.8 x 3.5 cm mass at the 3 o'clock position of the left breast.  There was also an intraductal mass located at the 12 o'clock position of the right breast.  No axillary adenopathy was noted.  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  Right breast mass showed fragments of epithelial proliferation with atypia differential diagnosis includes intraductal papilloma with at least atypical ductal hyperplasia.   Patient has a history of heart failure with reduced ejection fraction and atrial fibrillation for which she follows up with Dr. Darin Engels from cardiology.  Plan is to proceed with 12 weeks of weekly CarboTaxol chemotherapy along with Keytruda.  Anthracycline will not be given.  Patient could not tolerate Keytruda as evidenced by diarrhea and significant skin rash and was therefore stopped after 2 cycles    Interval history-patient reports worsening bilateral lower extremity edema.  She does have chronic  lymphedema but feels that the edema has been worse over the last few days.  She takes torsemide daily and was asked to increase the dose of torsemide as needed when her leg swelling increases but she has not done that recently.  Skin rash over her bilateral forearms and hands is getting better  ECOG PS- 2 Pain scale- 0   Review of systems- Review of Systems  Constitutional:  Positive for malaise/fatigue. Negative for chills, fever and weight loss.  HENT:  Negative for congestion, ear discharge and nosebleeds.   Eyes:  Negative for blurred vision.  Respiratory:  Negative for cough, hemoptysis, sputum production, shortness of breath and wheezing.   Cardiovascular:  Positive for leg swelling. Negative for chest pain, palpitations, orthopnea and claudication.  Gastrointestinal:  Negative for abdominal pain, blood in stool, constipation, diarrhea, heartburn, melena, nausea and vomiting.  Genitourinary:  Negative for dysuria, flank pain, frequency, hematuria and urgency.  Musculoskeletal:  Negative for back pain, joint pain and myalgias.  Skin:  Positive for rash.  Neurological:  Negative for dizziness, tingling, focal weakness, seizures, weakness and headaches.  Endo/Heme/Allergies:  Does not bruise/bleed easily.  Psychiatric/Behavioral:  Negative for depression and suicidal ideas. The patient does not have insomnia.       Allergies  Allergen Reactions   Aspirin Other (See Comments)    Bleeding risk since patient is on xarelta   Ciprofloxacin Other (See Comments)    Other Reaction: RASH ITCHING    Codeine Other (See Comments)   Diazepam Other (See Comments) and Nausea And Vomiting   Dofetilide Other (See Comments)    Other Reaction: arrhythmia    Hydrocodone-Acetaminophen Other (See  Comments) and Nausea And Vomiting   Morphine Nausea And Vomiting   Niacin Other (See Comments)    fatigue   Oxycodone-Acetaminophen Other (See Comments) and Nausea And Vomiting   Sotalol Other (See  Comments)    Other Reaction: Prolonged QTc    Amiodarone Other (See Comments)    Affecting liver    Pyridoxine Other (See Comments)    Splitting headache   Sulfamethoxazole-Trimethoprim Swelling   Other Rash    EKG sticker electrodes extended time     Past Medical History:  Diagnosis Date   Atrial fibrillation (HCC)    Atrial fibrillation with RVR (HCC)    Breast cancer, left (HCC) 11/2022   CHF (congestive heart failure) (HCC)    Complication of anesthesia    respiratory depression; needs to sit up immediately after   Coronary artery disease    Edema of both lower extremities    GERD (gastroesophageal reflux disease)    Hypertension, essential    Hyperthyroidism    Morbid obesity with BMI of 45.0-49.9, adult (HCC)    Pneumonia    Pneumonia due to COVID-19 virus    Sick sinus syndrome (HCC)    Sleep apnea    Type 2 diabetes mellitus without complication (HCC)    Vitamin D deficiency      Past Surgical History:  Procedure Laterality Date   ABSCESS DRAINAGE     BREAST BIOPSY Left 01/15/2023   [place] Bx Left Ribbon - Path pending   breast biopsy Right 01/15/2023   [place] Bx Right Coil Clip - Path Pending   BREAST BIOPSY Right 01/15/2023   [place] RT BREAST BX W LOC DEV 1ST LESION IMG BX SPEC US GUIDE 01/15/2023 [facility]   BREAST BIOPSY Left 01/15/2023   [place] LT BREAST BX W LOC DEV 1ST LESION IMG BX SPEC US GUIDE 01/15/2023 [facility]   CARDIAC CATHETERIZATION     CARDIAC PACEMAKER PLACEMENT  06/26/2008   [employer] Jude dual chamber   CESAREAN SECTION     x 3   CHOLECYSTECTOMY     PACEMAKER GENERATOR CHANGE  03/28/2016   PORTACATH PLACEMENT Right 01/31/2023   Procedure: INSERTION PORT-A-CATH;  Surgeon: Carolan Shiver, MD;  Location: [facility];  Service: General;  Laterality: Right;   TOTAL ABDOMINAL HYSTERECTOMY     TUBAL LIGATION      Social History   Socioeconomic History   Marital status: Single    Spouse name: Not on file   Number of children: 3   Years  of education: Not on file   Highest education level: Not on file  Occupational History   Not on file  Tobacco Use   Smoking status: Never   Smokeless tobacco: Never  Vaping Use   Vaping status: Never Used  Substance and Sexual Activity   Alcohol use: Not Currently   Drug use: Never   Sexual activity: Not Currently  Other Topics Concern   Not on file  Social History Narrative   Not on file   Social Determinants of Health   Financial Resource Strain: Low Risk  (09/06/2022)   Received from [facility]   Overall Financial Resource Strain (CARDIA)    Difficulty of Paying Living Expenses: Not hard at all  Food Insecurity: No Food Insecurity (01/19/2023)   Hunger Vital Sign    Worried About Running Out of Food in the Last Year: Never true    Ran Out of Food in the Last Year: Never true  Transportation Needs: No Transportation Needs (  01/19/2023)   PRAPARE - Administrator, Civil Service (Medical): No    Lack of Transportation (Non-Medical): No  Physical Activity: Not on file  Stress: Not on file  Social Connections: Not on file  Intimate Partner Violence: Not At Risk (01/19/2023)   Humiliation, Afraid, Rape, and Kick questionnaire    Fear of Current or Ex-Partner: No    Emotionally Abused: No    Physically Abused: No    Sexually Abused: No    Family History  Problem Relation Age of Onset   Mesothelioma Father      Current Outpatient Medications:    clobetasol cream (TEMOVATE) 0.05 %, Apply topically., Disp: , Rfl:    citalopram (CELEXA) 10 MG tablet, Take 10 mg by mouth daily., Disp: , Rfl:    dexamethasone (DECADRON) 4 MG tablet, Take 2 tablets daily for 2 days, start the day after chemotherapy. Take with food., Disp: 30 tablet, Rfl: 1   diphenoxylate-atropine (LOMOTIL) 2.5-0.025 MG tablet, Take 1 tablet by mouth 4 (four) times daily as needed for diarrhea or loose stools., Disp: 30 tablet, Rfl: 0   lidocaine-prilocaine (EMLA) cream, Apply to  affected area once, Disp: 30 g, Rfl: 3   metoprolol succinate (TOPROL-XL) 25 MG 24 hr tablet, Take 12.5 mg by mouth daily., Disp: , Rfl:    Multiple Vitamins-Minerals (CENTRUM SILVER 50+WOMEN PO), Take 1 tablet by mouth daily., Disp: , Rfl:    nitroGLYCERIN (NITROSTAT) 0.4 MG SL tablet, Place 0.4 mg under the tongue every 5 (five) minutes as needed for chest pain. (Patient not taking: Reported on 02/08/2023), Disp: , Rfl:    ondansetron (ZOFRAN) 4 MG tablet, Take 1 tablet (4 mg total) by mouth daily as needed for nausea or vomiting., Disp: 30 tablet, Rfl: 1   ondansetron (ZOFRAN) 8 MG tablet, Take 1 tablet (8 mg total) by mouth every 8 (eight) hours as needed for nausea or vomiting. Start on the third day after chemotherapy. (Patient not taking: Reported on 02/08/2023), Disp: 30 tablet, Rfl: 1   potassium chloride (KLOR-CON) 10 MEQ tablet, Take 30 mEq by mouth 2 (two) times daily. Am and 4 pm, Disp: , Rfl:    prochlorperazine (COMPAZINE) 10 MG tablet, Take 1 tablet (10 mg total) by mouth every 6 (six) hours as needed for nausea or vomiting., Disp: 30 tablet, Rfl: 1   rivaroxaban (XARELTO) 20 MG TABS tablet, Take 20 mg by mouth every evening., Disp: , Rfl:    torsemide (DEMADEX) 20 MG tablet, Take 40 mg by mouth daily in the afternoon. 4 pm, Disp: , Rfl:    triamcinolone ointment (KENALOG) 0.5 %, Apply 1 Application topically 2 (two) times daily as needed (itching from rash)., Disp: 30 g, Rfl: 1  Physical exam:  Vitals:   04/06/23 0940  BP: 121/76  Pulse: 84  Resp: 18  Temp: (!) 97.2 F (36.2 C)  TempSrc: Tympanic  SpO2: 97%  Weight: 267 lb 12.8 oz (121.5 kg)   Physical Exam Cardiovascular:     Rate and Rhythm: Normal rate and regular rhythm.     Heart sounds: Normal heart sounds.  Pulmonary:     Effort: Pulmonary effort is normal.     Breath sounds: Normal breath sounds.  Musculoskeletal:     Cervical back: Normal range of motion.     Right lower leg: Edema present.     Left lower  leg: Edema present.     Comments: Changes of chronic stasis dermatitis  Skin:  General: Skin is warm and dry.     Comments: Erythematous maculopapular rash over bilateral forearms appears better  Patient also has superficial ulceration in her bilateral buttocks  Neurological:     Mental Status: She is alert and oriented to person, place, and time.         Latest Ref Rng & Units 03/30/2023    8:27 AM  CMP  Glucose 70 - 99 mg/dL 387   BUN 8 - 23 mg/dL 9   Creatinine 5.64 - 3.32 mg/dL 9.51   Sodium 884 - 166 mmol/L 136   Potassium 3.5 - 5.1 mmol/L 3.8   Chloride 98 - 111 mmol/L 102   CO2 22 - 32 mmol/L 27   Calcium 8.9 - 10.3 mg/dL 8.2   Total Protein 6.5 - 8.1 g/dL 6.5   Total Bilirubin <0.6 mg/dL 1.4   Alkaline Phos 38 - 126 U/L 98   AST 15 - 41 U/L 39   ALT 0 - 44 U/L 43       Latest Ref Rng & Units 04/06/2023   10:08 AM  CBC  WBC 4.0 - 10.5 K/uL 6.4   Hemoglobin 12.0 - 15.0 g/dL 9.5   Hematocrit 30.1 - 46.0 % 31.5   Platelets 150 - 400 K/uL 152      Assessment and plan- Patient is a 77 y.o. female with history of stage II triple negative breast cancer here for on treatment assessment prior to cycle 8 of weekly CarboTaxol chemotherapy  Given her worsening rash in her bilateral forearms she will be getting Abraxane instead of Taxol starting today.  White cell count has improved after receiving daily Neupogen.  She will directly proceed for cycle 9 next week and I will see her back in 2 weeks for cycle 10 of carbo Abraxane chemotherapy.  We will give her Neupogen if she develops worsening neutropenia.  Skin rash over bilateral forearms: Continue topical steroids.  She is also following up with dermatology  Patient has superficial pressure ulceration over her bilateral buttocks. Recommend topical zinc oxide and donut pillow to reduce pressure while sitting   Visit Diagnosis 1. Encounter for antineoplastic chemotherapy   2. Malignant neoplasm of upper-outer quadrant of  left breast in female, estrogen receptor negative (HCC)      Dr. Owens Shark, MD, MPH [facility] at [facility] [phone] 04/06/2023 9:42 AM

## 2023-04-07 ENCOUNTER — Encounter: Payer: Self-pay | Admitting: Oncology

## 2023-04-07 ENCOUNTER — Other Ambulatory Visit: Payer: Self-pay | Admitting: Oncology

## 2023-04-08 ENCOUNTER — Other Ambulatory Visit: Payer: Self-pay

## 2023-04-09 ENCOUNTER — Ambulatory Visit: Payer: Medicare HMO | Admitting: Oncology

## 2023-04-09 ENCOUNTER — Other Ambulatory Visit: Payer: Medicare HMO

## 2023-04-09 ENCOUNTER — Ambulatory Visit: Payer: Medicare HMO

## 2023-04-10 ENCOUNTER — Ambulatory Visit: Payer: Medicare HMO

## 2023-04-11 ENCOUNTER — Ambulatory Visit: Payer: Medicare HMO

## 2023-04-11 ENCOUNTER — Other Ambulatory Visit: Payer: Self-pay

## 2023-04-13 ENCOUNTER — Ambulatory Visit: Payer: Medicare HMO

## 2023-04-13 ENCOUNTER — Ambulatory Visit: Payer: Medicare HMO | Admitting: Oncology

## 2023-04-13 ENCOUNTER — Inpatient Hospital Stay: Payer: Medicare HMO

## 2023-04-13 ENCOUNTER — Other Ambulatory Visit: Payer: Medicare HMO

## 2023-04-13 VITALS — BP 135/57 | HR 86 | Temp 97.5°F | Resp 20 | Wt 267.9 lb

## 2023-04-13 DIAGNOSIS — Z171 Estrogen receptor negative status [ER-]: Secondary | ICD-10-CM

## 2023-04-13 DIAGNOSIS — Z5111 Encounter for antineoplastic chemotherapy: Secondary | ICD-10-CM | POA: Diagnosis not present

## 2023-04-13 LAB — CMP (CANCER CENTER ONLY)
ALT: 21 U/L (ref 0–44)
AST: 26 U/L (ref 15–41)
Albumin: 2.9 g/dL — ABNORMAL LOW (ref 3.5–5.0)
Alkaline Phosphatase: 68 U/L (ref 38–126)
Anion gap: 10 (ref 5–15)
BUN: 13 mg/dL (ref 8–23)
CO2: 29 mmol/L (ref 22–32)
Calcium: 8.8 mg/dL — ABNORMAL LOW (ref 8.9–10.3)
Chloride: 101 mmol/L (ref 98–111)
Creatinine: 0.92 mg/dL (ref 0.44–1.00)
GFR, Estimated: >60 mL/min (ref >60)
Glucose, Bld: 139 mg/dL — ABNORMAL HIGH (ref 70–99)
Potassium: 4.1 mmol/L (ref 3.5–5.1)
Sodium: 140 mmol/L (ref 135–145)
Total Bilirubin: 1.4 mg/dL — ABNORMAL HIGH (ref <1.2)
Total Protein: 6.7 g/dL (ref 6.5–8.1)

## 2023-04-13 LAB — CBC WITH DIFFERENTIAL (CANCER CENTER ONLY)
Abs Immature Granulocytes: 0.05 10*3/uL (ref 0.00–0.07)
Basophils Absolute: 0.1 10*3/uL (ref 0.0–0.1)
Basophils Relative: 2 %
Eosinophils Absolute: 0 10*3/uL (ref 0.0–0.5)
Eosinophils Relative: 0 %
HCT: 27.3 % — ABNORMAL LOW (ref 36.0–46.0)
Hemoglobin: 8.3 g/dL — ABNORMAL LOW (ref 12.0–15.0)
Immature Granulocytes: 2 %
Lymphocytes Relative: 41 %
Lymphs Abs: 1.4 10*3/uL (ref 0.7–4.0)
MCH: 27.9 pg (ref 26.0–34.0)
MCHC: 30.4 g/dL (ref 30.0–36.0)
MCV: 91.9 fL (ref 80.0–100.0)
Monocytes Absolute: 0.1 10*3/uL (ref 0.1–1.0)
Monocytes Relative: 3 %
Neutro Abs: 1.8 10*3/uL (ref 1.7–7.7)
Neutrophils Relative %: 52 %
Platelet Count: 190 10*3/uL (ref 150–400)
RBC: 2.97 MIL/uL — ABNORMAL LOW (ref 3.87–5.11)
RDW: 17.3 % — ABNORMAL HIGH (ref 11.5–15.5)
Smear Review: NORMAL
WBC Count: 3.4 10*3/uL — ABNORMAL LOW (ref 4.0–10.5)
nRBC: 0 % (ref 0.0–0.2)

## 2023-04-13 MED ORDER — HEPARIN SOD (PORK) LOCK FLUSH 100 UNIT/ML IV SOLN
500.0000 [IU] | Freq: Once | INTRAVENOUS | Status: AC | PRN
  Administered 2023-04-13: 500 [IU]
  Filled 2023-04-13: qty 5

## 2023-04-13 MED ORDER — PACLITAXEL PROTEIN-BOUND CHEMO INJECTION 100 MG
100.0000 mg/m2 | Freq: Once | INTRAVENOUS | Status: AC
Start: 2023-04-13 — End: 2023-04-13
  Administered 2023-04-13: 250 mg via INTRAVENOUS
  Filled 2023-04-13: qty 50

## 2023-04-13 MED ORDER — CARBOPLATIN CHEMO INJECTION 450 MG/45ML
162.6000 mg | Freq: Once | INTRAVENOUS | Status: AC
  Administered 2023-04-13: 160 mg via INTRAVENOUS
  Filled 2023-04-13: qty 16

## 2023-04-13 MED ORDER — PALONOSETRON HCL INJECTION 0.25 MG/5ML
0.2500 mg | Freq: Once | INTRAVENOUS | Status: AC
  Administered 2023-04-13: 0.25 mg via INTRAVENOUS
  Filled 2023-04-13: qty 5

## 2023-04-13 MED ORDER — DEXAMETHASONE SODIUM PHOSPHATE 10 MG/ML IJ SOLN
10.0000 mg | Freq: Once | INTRAMUSCULAR | Status: AC
Start: 2023-04-13 — End: 2023-04-13
  Administered 2023-04-13: 10 mg via INTRAVENOUS
  Filled 2023-04-13: qty 1

## 2023-04-13 MED ORDER — SODIUM CHLORIDE 0.9% FLUSH
10.0000 mL | INTRAVENOUS | Status: DC | PRN
Start: 2023-04-13 — End: 2023-04-13
  Administered 2023-04-13: 10 mL
  Filled 2023-04-13: qty 10

## 2023-04-13 MED ORDER — SODIUM CHLORIDE 0.9 % IV SOLN
Freq: Once | INTRAVENOUS | Status: AC
  Filled 2023-04-13: qty 250

## 2023-04-13 NOTE — Patient Instructions (Signed)
[facility] - A DEPT OF [facility]  Discharge Instructions: Thank you for choosing [facility] to provide your oncology and hematology care.  If you have a lab appointment with the Cancer Center, please go directly to the Cancer Center and check in at the registration area.  Wear comfortable clothing and clothing appropriate for easy access to any Portacath or PICC line.   We strive to give you quality time with your provider. You may need to reschedule your appointment if you arrive late (15 or more minutes).  Arriving late affects you and other patients whose appointments are after yours.  Also, if you miss three or more appointments without notifying the office, you may be dismissed from the clinic at the provider's discretion.      For prescription refill requests, have your pharmacy contact our office and allow 72 hours for refills to be completed.    Today you received the following chemotherapy and/or immunotherapy agents abraxane, carboplatin      To help prevent nausea and vomiting after your treatment, we encourage you to take your nausea medication as directed.  BELOW ARE SYMPTOMS THAT SHOULD BE REPORTED IMMEDIATELY: *FEVER GREATER THAN 100.4 F (38 C) OR HIGHER *CHILLS OR SWEATING *NAUSEA AND VOMITING THAT IS NOT CONTROLLED WITH YOUR NAUSEA MEDICATION *UNUSUAL SHORTNESS OF BREATH *UNUSUAL BRUISING OR BLEEDING *URINARY PROBLEMS (pain or burning when urinating, or frequent urination) *BOWEL PROBLEMS (unusual diarrhea, constipation, pain near the anus) TENDERNESS IN MOUTH AND THROAT WITH OR WITHOUT PRESENCE OF ULCERS (sore throat, sores in mouth, or a toothache) UNUSUAL RASH, SWELLING OR PAIN  UNUSUAL VAGINAL DISCHARGE OR ITCHING   Items with * indicate a potential emergency and should be followed up as soon as possible or go to the Emergency Department if any problems should occur.  Please show the CHEMOTHERAPY ALERT CARD or  IMMUNOTHERAPY ALERT CARD at check-in to the Emergency Department and triage nurse.  Should you have questions after your visit or need to cancel or reschedule your appointment, please contact [facility] - A DEPT OF [facility]  [phone] and follow the prompts.  Office hours are 8:00 a.m. to 4:30 p.m. Monday - Friday. Please note that voicemails left after 4:00 p.m. may not be returned until the following business day.  We are closed weekends and major holidays. You have access to a nurse at all times for urgent questions. Please call the main number to the clinic [phone] and follow the prompts.  For any non-urgent questions, you may also contact your provider using MyChart. We now offer e-Visits for anyone 57 and older to request care online for non-urgent symptoms. For details visit [email or web address].   Also download the MyChart app! Go to the app store, search "MyChart", open the app, select [place], and log in with your MyChart username and password.

## 2023-04-16 ENCOUNTER — Ambulatory Visit: Payer: Medicare HMO | Admitting: Oncology

## 2023-04-16 ENCOUNTER — Ambulatory Visit: Payer: Medicare HMO

## 2023-04-16 ENCOUNTER — Other Ambulatory Visit: Payer: Medicare HMO

## 2023-04-17 ENCOUNTER — Ambulatory Visit: Payer: Medicare HMO

## 2023-04-18 ENCOUNTER — Ambulatory Visit: Payer: Medicare HMO

## 2023-04-23 ENCOUNTER — Ambulatory Visit: Payer: Medicare HMO

## 2023-04-23 ENCOUNTER — Other Ambulatory Visit: Payer: Medicare HMO

## 2023-04-23 ENCOUNTER — Ambulatory Visit: Payer: Medicare HMO | Admitting: Oncology

## 2023-04-24 ENCOUNTER — Ambulatory Visit: Payer: Medicare HMO

## 2023-04-25 ENCOUNTER — Ambulatory Visit: Payer: Medicare HMO

## 2023-04-26 ENCOUNTER — Ambulatory Visit: Payer: Medicare HMO

## 2023-04-27 ENCOUNTER — Ambulatory Visit: Payer: Medicare HMO

## 2023-04-27 ENCOUNTER — Other Ambulatory Visit: Payer: Medicare HMO

## 2023-04-27 ENCOUNTER — Inpatient Hospital Stay: Payer: Medicare HMO

## 2023-04-27 ENCOUNTER — Encounter: Payer: Self-pay | Admitting: Oncology

## 2023-04-27 ENCOUNTER — Ambulatory Visit: Payer: Medicare HMO | Admitting: Oncology

## 2023-04-27 ENCOUNTER — Inpatient Hospital Stay (HOSPITAL_BASED_OUTPATIENT_CLINIC_OR_DEPARTMENT_OTHER): Payer: Medicare HMO | Admitting: Oncology

## 2023-04-27 ENCOUNTER — Inpatient Hospital Stay: Payer: Medicare HMO | Attending: Oncology

## 2023-04-27 VITALS — BP 119/79 | HR 71 | Temp 97.1°F | Resp 19

## 2023-04-27 VITALS — BP 110/46 | HR 93 | Temp 97.1°F | Resp 20 | Wt 266.0 lb

## 2023-04-27 DIAGNOSIS — G4730 Sleep apnea, unspecified: Secondary | ICD-10-CM | POA: Diagnosis not present

## 2023-04-27 DIAGNOSIS — Z7901 Long term (current) use of anticoagulants: Secondary | ICD-10-CM | POA: Insufficient documentation

## 2023-04-27 DIAGNOSIS — T451X5A Adverse effect of antineoplastic and immunosuppressive drugs, initial encounter: Secondary | ICD-10-CM

## 2023-04-27 DIAGNOSIS — I4891 Unspecified atrial fibrillation: Secondary | ICD-10-CM | POA: Insufficient documentation

## 2023-04-27 DIAGNOSIS — Z6841 Body Mass Index (BMI) 40.0 and over, adult: Secondary | ICD-10-CM | POA: Diagnosis not present

## 2023-04-27 DIAGNOSIS — I2510 Atherosclerotic heart disease of native coronary artery without angina pectoris: Secondary | ICD-10-CM | POA: Diagnosis not present

## 2023-04-27 DIAGNOSIS — D6481 Anemia due to antineoplastic chemotherapy: Secondary | ICD-10-CM

## 2023-04-27 DIAGNOSIS — E119 Type 2 diabetes mellitus without complications: Secondary | ICD-10-CM | POA: Insufficient documentation

## 2023-04-27 DIAGNOSIS — Z7952 Long term (current) use of systemic steroids: Secondary | ICD-10-CM | POA: Diagnosis not present

## 2023-04-27 DIAGNOSIS — I110 Hypertensive heart disease with heart failure: Secondary | ICD-10-CM | POA: Diagnosis not present

## 2023-04-27 DIAGNOSIS — Z5111 Encounter for antineoplastic chemotherapy: Secondary | ICD-10-CM | POA: Insufficient documentation

## 2023-04-27 DIAGNOSIS — Z79899 Other long term (current) drug therapy: Secondary | ICD-10-CM | POA: Diagnosis not present

## 2023-04-27 DIAGNOSIS — Z171 Estrogen receptor negative status [ER-]: Secondary | ICD-10-CM | POA: Insufficient documentation

## 2023-04-27 DIAGNOSIS — K219 Gastro-esophageal reflux disease without esophagitis: Secondary | ICD-10-CM | POA: Insufficient documentation

## 2023-04-27 DIAGNOSIS — C50412 Malignant neoplasm of upper-outer quadrant of left female breast: Secondary | ICD-10-CM | POA: Insufficient documentation

## 2023-04-27 LAB — CBC WITH DIFFERENTIAL (CANCER CENTER ONLY)
Abs Immature Granulocytes: 0.14 10*3/uL — ABNORMAL HIGH (ref 0.00–0.07)
Basophils Absolute: 0 10*3/uL (ref 0.0–0.1)
Basophils Relative: 1 %
Eosinophils Absolute: 0 10*3/uL (ref 0.0–0.5)
Eosinophils Relative: 0 %
HCT: 27.2 % — ABNORMAL LOW (ref 36.0–46.0)
Hemoglobin: 8.1 g/dL — ABNORMAL LOW (ref 12.0–15.0)
Immature Granulocytes: 3 %
Lymphocytes Relative: 31 %
Lymphs Abs: 1.8 10*3/uL (ref 0.7–4.0)
MCH: 27 pg (ref 26.0–34.0)
MCHC: 29.8 g/dL — ABNORMAL LOW (ref 30.0–36.0)
MCV: 90.7 fL (ref 80.0–100.0)
Monocytes Absolute: 0.9 10*3/uL (ref 0.1–1.0)
Monocytes Relative: 17 %
Neutro Abs: 2.8 10*3/uL (ref 1.7–7.7)
Neutrophils Relative %: 48 %
Platelet Count: 234 10*3/uL (ref 150–400)
RBC: 3 MIL/uL — ABNORMAL LOW (ref 3.87–5.11)
RDW: 19.4 % — ABNORMAL HIGH (ref 11.5–15.5)
WBC Count: 5.7 10*3/uL (ref 4.0–10.5)
nRBC: 0.9 % — ABNORMAL HIGH (ref 0.0–0.2)

## 2023-04-27 LAB — CMP (CANCER CENTER ONLY)
ALT: 12 U/L (ref 0–44)
AST: 18 U/L (ref 15–41)
Albumin: 2.6 g/dL — ABNORMAL LOW (ref 3.5–5.0)
Alkaline Phosphatase: 67 U/L (ref 38–126)
Anion gap: 10 (ref 5–15)
BUN: 17 mg/dL (ref 8–23)
CO2: 25 mmol/L (ref 22–32)
Calcium: 8.3 mg/dL — ABNORMAL LOW (ref 8.9–10.3)
Chloride: 106 mmol/L (ref 98–111)
Creatinine: 1.07 mg/dL — ABNORMAL HIGH (ref 0.44–1.00)
GFR, Estimated: 53 mL/min — ABNORMAL LOW (ref >60)
Glucose, Bld: 120 mg/dL — ABNORMAL HIGH (ref 70–99)
Potassium: 3.7 mmol/L (ref 3.5–5.1)
Sodium: 141 mmol/L (ref 135–145)
Total Bilirubin: 0.7 mg/dL (ref <1.2)
Total Protein: 6.7 g/dL (ref 6.5–8.1)

## 2023-04-27 MED ORDER — DEXAMETHASONE SODIUM PHOSPHATE 10 MG/ML IJ SOLN
10.0000 mg | Freq: Once | INTRAMUSCULAR | Status: AC
  Administered 2023-04-27: 10 mg via INTRAVENOUS
  Filled 2023-04-27: qty 1

## 2023-04-27 MED ORDER — SODIUM CHLORIDE 0.9 % IV SOLN
Freq: Once | INTRAVENOUS | Status: AC
  Filled 2023-04-27: qty 250

## 2023-04-27 MED ORDER — HEPARIN SOD (PORK) LOCK FLUSH 100 UNIT/ML IV SOLN
500.0000 [IU] | Freq: Once | INTRAVENOUS | Status: AC | PRN
  Administered 2023-04-27: 500 [IU]
  Filled 2023-04-27: qty 5

## 2023-04-27 MED ORDER — SODIUM CHLORIDE 0.9 % IV SOLN
162.6000 mg | Freq: Once | INTRAVENOUS | Status: AC
  Administered 2023-04-27: 160 mg via INTRAVENOUS
  Filled 2023-04-27: qty 16

## 2023-04-27 MED ORDER — PALONOSETRON HCL INJECTION 0.25 MG/5ML
0.2500 mg | Freq: Once | INTRAVENOUS | Status: AC
  Administered 2023-04-27: 0.25 mg via INTRAVENOUS
  Filled 2023-04-27: qty 5

## 2023-04-27 MED ORDER — PACLITAXEL PROTEIN-BOUND CHEMO INJECTION 100 MG
100.0000 mg/m2 | Freq: Once | INTRAVENOUS | Status: AC
  Administered 2023-04-27: 250 mg via INTRAVENOUS
  Filled 2023-04-27: qty 50

## 2023-04-27 NOTE — Patient Instructions (Signed)
[facility]  Discharge Instructions: Thank you for choosing [facility] to provide your oncology and hematology care.  If you have a lab appointment with the Cancer Center, please go directly to the Cancer Center and check in at the registration area.  Wear comfortable clothing and clothing appropriate for easy access to any Portacath or PICC line.   We strive to give you quality time with your provider. You may need to reschedule your appointment if you arrive late (15 or more minutes).  Arriving late affects you and other patients whose appointments are after yours.  Also, if you miss three or more appointments without notifying the office, you may be dismissed from the clinic at the provider's discretion.      For prescription refill requests, have your pharmacy contact our office and allow 72 hours for refills to be completed.    Today you received the following chemotherapy and/or immunotherapy agents abraxane and carboplatin      To help prevent nausea and vomiting after your treatment, we encourage you to take your nausea medication as directed.  BELOW ARE SYMPTOMS THAT SHOULD BE REPORTED IMMEDIATELY: *FEVER GREATER THAN 100.4 F (38 C) OR HIGHER *CHILLS OR SWEATING *NAUSEA AND VOMITING THAT IS NOT CONTROLLED WITH YOUR NAUSEA MEDICATION *UNUSUAL SHORTNESS OF BREATH *UNUSUAL BRUISING OR BLEEDING *URINARY PROBLEMS (pain or burning when urinating, or frequent urination) *BOWEL PROBLEMS (unusual diarrhea, constipation, pain near the anus) TENDERNESS IN MOUTH AND THROAT WITH OR WITHOUT PRESENCE OF ULCERS (sore throat, sores in mouth, or a toothache) UNUSUAL RASH, SWELLING OR PAIN  UNUSUAL VAGINAL DISCHARGE OR ITCHING   Items with * indicate a potential emergency and should be followed up as soon as possible or go to the Emergency Department if any problems should occur.  Please show the CHEMOTHERAPY ALERT CARD or  IMMUNOTHERAPY ALERT CARD at check-in to the Emergency Department and triage nurse.  Should you have questions after your visit or need to cancel or reschedule your appointment, please contact [facility] - A DEPT OF [facility]  [phone] and follow the prompts.  Office hours are 8:00 a.m. to 4:30 p.m. Monday - Friday. Please note that voicemails left after 4:00 p.m. may not be returned until the following business day.  We are closed weekends and major holidays. You have access to a nurse at all times for urgent questions. Please call the main number to the clinic [phone] and follow the prompts.  For any non-urgent questions, you may also contact your provider using MyChart. We now offer e-Visits for anyone 80 and older to request care online for non-urgent symptoms. For details visit [email or web address].   Also download the MyChart app! Go to the app store, search "MyChart", open the app, select , and log in with your MyChart username and password.

## 2023-04-27 NOTE — Progress Notes (Signed)
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  This was followed by a diagnostic mammogram and ultrasound.  Mammogram showed 3.4 x 1.8 x 3.5 cm mass at the 3 o'clock position of the left breast.  There was also an intraductal mass located at the 12 o'clock position of the right breast.  No axillary adenopathy was noted.  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  Right breast mass showed fragments of epithelial proliferation with atypia differential diagnosis includes intraductal papilloma with at least atypical ductal hyperplasia.   Patient has a history of heart failure with reduced ejection fraction and atrial fibrillation for which she follows up with Dr. Darin Engels from cardiology.  Plan is to proceed with 12 weeks of weekly CarboTaxol chemotherapy along with Keytruda.  Anthracycline will not be given.  Patient could not tolerate Keytruda as evidenced by diarrhea and significant skin rash and was therefore stopped after 2 cycles.  Patient had significant skin rash that persisted despite stopping Keytruda and there was concern for  allergic reaction as well because of which Taxol was stopped after 4 cycles and she was switched to Abraxane    Interval history-she has ongoing fatigue due to chemotherapy.  Skin rash has remained overall stable and is not worsening any further.  Toe and fingernails are slowly falling off with chemo  ECOG PS- 2 Pain scale- 0   Review of systems- Review of Systems  Constitutional:  Positive for malaise/fatigue. Negative for chills, fever and weight loss.  HENT:  Negative for congestion, ear discharge and nosebleeds.   Eyes:  Negative for blurred vision.  Respiratory:  Negative for cough, hemoptysis, sputum production, shortness of breath and wheezing.   Cardiovascular:  Negative for chest pain, palpitations, orthopnea and claudication.  Gastrointestinal:  Negative for abdominal pain, blood in stool, constipation, diarrhea, heartburn, melena, nausea and vomiting.  Genitourinary:  Negative for dysuria, flank pain, frequency, hematuria and urgency.  Musculoskeletal:  Negative for back pain, joint pain and myalgias.  Skin:  Negative for rash.  Neurological:  Negative for dizziness, tingling, focal weakness, seizures, weakness and headaches.  Endo/Heme/Allergies:  Does not bruise/bleed easily.  Psychiatric/Behavioral:  Negative for depression and suicidal ideas. The patient does not have insomnia.       Allergies  Allergen Reactions   Aspirin Other (See Comments)    Bleeding risk since patient is on xarelta   Ciprofloxacin Other (See Comments)    Other Reaction: RASH ITCHING    Codeine Other (See Comments)   Diazepam Other (See Comments) and Nausea And Vomiting   Dofetilide Other (See Comments)    Other Reaction: arrhythmia    Hydrocodone-Acetaminophen Other (See Comments) and Nausea And  Vomiting   Morphine Nausea And Vomiting   Niacin Other (See Comments)    fatigue   Oxycodone-Acetaminophen Other (See Comments) and Nausea And Vomiting   Sotalol Other (See Comments)    Other  Reaction: Prolonged QTc    Amiodarone Other (See Comments)    Affecting liver    Pyridoxine Other (See Comments)    Splitting headache   Sulfamethoxazole-Trimethoprim Swelling   Other Rash    EKG sticker electrodes extended time     Past Medical History:  Diagnosis Date   Atrial fibrillation (HCC)    Atrial fibrillation with RVR (HCC)    Breast cancer, left (HCC) 11/2022   CHF (congestive heart failure) (HCC)    Complication of anesthesia    respiratory depression; needs to sit up immediately after   Coronary artery disease    Edema of both lower extremities    GERD (gastroesophageal reflux disease)    Hypertension, essential    Hyperthyroidism    Morbid obesity with BMI of 45.0-49.9, adult (HCC)    Pneumonia    Pneumonia due to COVID-19 virus    Sick sinus syndrome (HCC)    Sleep apnea    Type 2 diabetes mellitus without complication (HCC)    Vitamin D deficiency      Past Surgical History:  Procedure Laterality Date   ABSCESS DRAINAGE     BREAST BIOPSY Left 01/15/2023   [place] Bx Left Ribbon - Path pending   breast biopsy Right 01/15/2023   [place] Bx Right Coil Clip - Path Pending   BREAST BIOPSY Right 01/15/2023   [place] RT BREAST BX W LOC DEV 1ST LESION IMG BX SPEC US GUIDE 01/15/2023 [facility]   BREAST BIOPSY Left 01/15/2023   [place] LT BREAST BX W LOC DEV 1ST LESION IMG BX SPEC US GUIDE 01/15/2023 [facility]   CARDIAC CATHETERIZATION     CARDIAC PACEMAKER PLACEMENT  06/26/2008   [employer] Jude dual chamber   CESAREAN SECTION     x 3   CHOLECYSTECTOMY     PACEMAKER GENERATOR CHANGE  03/28/2016   PORTACATH PLACEMENT Right 01/31/2023   Procedure: INSERTION PORT-A-CATH;  Surgeon: Carolan Shiver, MD;  Location: [facility];  Service: General;  Laterality: Right;   TOTAL ABDOMINAL HYSTERECTOMY     TUBAL LIGATION      Social History   Socioeconomic History   Marital status: Single    Spouse name: Not on file   Number of children: 3   Years of education: Not on  file   Highest education level: Not on file  Occupational History   Not on file  Tobacco Use   Smoking status: Never   Smokeless tobacco: Never  Vaping Use   Vaping status: Never Used  Substance and Sexual Activity   Alcohol use: Not Currently   Drug use: Never   Sexual activity: Not Currently  Other Topics Concern   Not on file  Social History Narrative   Not on file   Social Determinants of Health   Financial Resource Strain: Low Risk  (09/06/2022)   Received from [facility]   Overall Financial Resource Strain (CARDIA)    Difficulty of Paying Living Expenses: Not hard at all  Food Insecurity: No Food Insecurity (01/19/2023)   Hunger Vital Sign    Worried About Running Out of Food in the Last Year: Never true    Ran Out of Food in the Last Year: Never true  Transportation Needs: No Transportation Needs (01/19/2023)   PRAPARE -  Administrator, Civil Service (Medical): No    Lack of Transportation (Non-Medical): No  Physical Activity: Not on file  Stress: Not on file  Social Connections: Not on file  Intimate Partner Violence: Not At Risk (01/19/2023)   Humiliation, Afraid, Rape, and Kick questionnaire    Fear of Current or Ex-Partner: No    Emotionally Abused: No    Physically Abused: No    Sexually Abused: No    Family History  Problem Relation Age of Onset   Mesothelioma Father      Current Outpatient Medications:    citalopram (CELEXA) 10 MG tablet, Take 10 mg by mouth daily., Disp: , Rfl:    clobetasol cream (TEMOVATE) 0.05 %, Apply topically., Disp: , Rfl:    dexamethasone (DECADRON) 4 MG tablet, Take 2 tablets daily for 2 days, start the day after chemotherapy. Take with food., Disp: 30 tablet, Rfl: 1   diphenoxylate-atropine (LOMOTIL) 2.5-0.025 MG tablet, Take 1 tablet by mouth 4 (four) times daily as needed for diarrhea or loose stools., Disp: 30 tablet, Rfl: 0   lidocaine-prilocaine (EMLA) cream, Apply to affected area once,  Disp: 30 g, Rfl: 3   metoprolol succinate (TOPROL-XL) 25 MG 24 hr tablet, Take 12.5 mg by mouth daily., Disp: , Rfl:    Multiple Vitamins-Minerals (CENTRUM SILVER 50+WOMEN PO), Take 1 tablet by mouth daily., Disp: , Rfl:    nitroGLYCERIN (NITROSTAT) 0.4 MG SL tablet, Place 0.4 mg under the tongue every 5 (five) minutes as needed for chest pain. (Patient not taking: Reported on 02/08/2023), Disp: , Rfl:    ondansetron (ZOFRAN) 4 MG tablet, Take 1 tablet (4 mg total) by mouth daily as needed for nausea or vomiting., Disp: 30 tablet, Rfl: 1   ondansetron (ZOFRAN) 8 MG tablet, Take 1 tablet (8 mg total) by mouth every 8 (eight) hours as needed for nausea or vomiting. Start on the third day after chemotherapy. (Patient not taking: Reported on 02/08/2023), Disp: 30 tablet, Rfl: 1   potassium chloride (KLOR-CON) 10 MEQ tablet, Take 30 mEq by mouth 2 (two) times daily. Am and 4 pm, Disp: , Rfl:    prochlorperazine (COMPAZINE) 10 MG tablet, Take 1 tablet (10 mg total) by mouth every 6 (six) hours as needed for nausea or vomiting., Disp: 30 tablet, Rfl: 1   rivaroxaban (XARELTO) 20 MG TABS tablet, Take 20 mg by mouth every evening., Disp: , Rfl:    torsemide (DEMADEX) 20 MG tablet, Take 40 mg by mouth daily in the afternoon. 4 pm, Disp: , Rfl:    triamcinolone ointment (KENALOG) 0.5 %, Apply 1 Application topically 2 (two) times daily as needed (itching from rash)., Disp: 30 g, Rfl: 1  Physical exam:  Vitals:   04/27/23 0923  BP: (!) 110/46  Pulse: 93  Resp: 20  Temp: (!) 97.1 F (36.2 C)  TempSrc: Tympanic  SpO2: 97%  Weight: 266 lb (120.7 kg)   Physical Exam Constitutional:      Comments: Ambulates with a walker  Cardiovascular:     Rate and Rhythm: Normal rate and regular rhythm.     Heart sounds: Normal heart sounds.  Pulmonary:     Effort: Pulmonary effort is normal.     Breath sounds: Normal breath sounds.  Musculoskeletal:     Right lower leg: Edema present.     Left lower leg: Edema  present.  Skin:    General: Skin is warm and dry.  Neurological:     Mental Status:  She is alert and oriented to person, place, and time.         Latest Ref Rng & Units 04/27/2023    9:04 AM  CMP  Glucose 70 - 99 mg/dL 161   BUN 8 - 23 mg/dL 17   Creatinine 0.96 - 1.00 mg/dL 0.45   Sodium 409 - 811 mmol/L 141   Potassium 3.5 - 5.1 mmol/L 3.7   Chloride 98 - 111 mmol/L 106   CO2 22 - 32 mmol/L 25   Calcium 8.9 - 10.3 mg/dL 8.3   Total Protein 6.5 - 8.1 g/dL 6.7   Total Bilirubin <9.1 mg/dL 0.7   Alkaline Phos 38 - 126 U/L 67   AST 15 - 41 U/L 18   ALT 0 - 44 U/L 12       Latest Ref Rng & Units 04/27/2023    9:04 AM  CBC  WBC 4.0 - 10.5 K/uL 5.7   Hemoglobin 12.0 - 15.0 g/dL 8.1   Hematocrit 47.8 - 46.0 % 27.2   Platelets 150 - 400 K/uL 234      Assessment and plan- Patient is a 77 y.o. female  with history of stage II triple negative breast cancer here for on treatment assessment prior to cycle 10 of weekly carbo Abraxane chemotherapy  Counts okay to proceed with cycle 10 of weekly carbo Abraxane chemotherapy today.  She will directly proceed for cycle 11 next week and I will see her back in 2 weeks for cycle 12 which will be her last cycle.  I have also informed Dr. Maia Plan about her chemotherapy finish date so he can plan for surgery accordingly.  Chemo-induced anemia: Stable continue to monitor   Visit Diagnosis 1. Antineoplastic chemotherapy induced anemia   2. Malignant neoplasm of upper-outer quadrant of left breast in female, estrogen receptor negative (HCC)   3. Encounter for antineoplastic chemotherapy      Dr. Owens Shark, MD, MPH [facility] [phone] 04/27/2023 9:47 AM

## 2023-04-30 ENCOUNTER — Ambulatory Visit: Payer: Medicare HMO

## 2023-04-30 ENCOUNTER — Ambulatory Visit: Payer: Medicare HMO | Admitting: Oncology

## 2023-04-30 ENCOUNTER — Other Ambulatory Visit: Payer: Medicare HMO

## 2023-05-03 ENCOUNTER — Other Ambulatory Visit: Payer: Self-pay | Admitting: *Deleted

## 2023-05-04 ENCOUNTER — Inpatient Hospital Stay: Payer: Medicare HMO

## 2023-05-04 ENCOUNTER — Inpatient Hospital Stay (HOSPITAL_BASED_OUTPATIENT_CLINIC_OR_DEPARTMENT_OTHER): Payer: Medicare HMO | Admitting: Oncology

## 2023-05-04 ENCOUNTER — Other Ambulatory Visit: Payer: Medicare HMO

## 2023-05-04 ENCOUNTER — Encounter: Payer: Self-pay | Admitting: Oncology

## 2023-05-04 ENCOUNTER — Ambulatory Visit: Payer: Medicare HMO

## 2023-05-04 ENCOUNTER — Ambulatory Visit: Payer: Medicare HMO | Admitting: Oncology

## 2023-05-04 VITALS — HR 100

## 2023-05-04 VITALS — BP 121/64 | HR 103 | Temp 97.1°F | Resp 19 | Wt 267.5 lb

## 2023-05-04 DIAGNOSIS — Z171 Estrogen receptor negative status [ER-]: Secondary | ICD-10-CM

## 2023-05-04 DIAGNOSIS — T451X5A Adverse effect of antineoplastic and immunosuppressive drugs, initial encounter: Secondary | ICD-10-CM

## 2023-05-04 DIAGNOSIS — D6481 Anemia due to antineoplastic chemotherapy: Secondary | ICD-10-CM | POA: Diagnosis not present

## 2023-05-04 DIAGNOSIS — D701 Agranulocytosis secondary to cancer chemotherapy: Secondary | ICD-10-CM

## 2023-05-04 DIAGNOSIS — D649 Anemia, unspecified: Secondary | ICD-10-CM

## 2023-05-04 DIAGNOSIS — C50412 Malignant neoplasm of upper-outer quadrant of left female breast: Secondary | ICD-10-CM

## 2023-05-04 DIAGNOSIS — Z5111 Encounter for antineoplastic chemotherapy: Secondary | ICD-10-CM

## 2023-05-04 LAB — CMP (CANCER CENTER ONLY)
ALT: 15 U/L (ref 0–44)
AST: 24 U/L (ref 15–41)
Albumin: 2.9 g/dL — ABNORMAL LOW (ref 3.5–5.0)
Alkaline Phosphatase: 57 U/L (ref 38–126)
Anion gap: 6 (ref 5–15)
BUN: 9 mg/dL (ref 8–23)
CO2: 27 mmol/L (ref 22–32)
Calcium: 8.8 mg/dL — ABNORMAL LOW (ref 8.9–10.3)
Chloride: 107 mmol/L (ref 98–111)
Creatinine: 0.84 mg/dL (ref 0.44–1.00)
GFR, Estimated: >60 mL/min (ref >60)
Glucose, Bld: 119 mg/dL — ABNORMAL HIGH (ref 70–99)
Potassium: 4.3 mmol/L (ref 3.5–5.1)
Sodium: 140 mmol/L (ref 135–145)
Total Bilirubin: 1.1 mg/dL (ref <1.2)
Total Protein: 6.6 g/dL (ref 6.5–8.1)

## 2023-05-04 LAB — CBC WITH DIFFERENTIAL (CANCER CENTER ONLY)
Abs Immature Granulocytes: 0.03 10*3/uL (ref 0.00–0.07)
Basophils Absolute: 0 10*3/uL (ref 0.0–0.1)
Basophils Relative: 1 %
Eosinophils Absolute: 0 10*3/uL (ref 0.0–0.5)
Eosinophils Relative: 0 %
HCT: 25.5 % — ABNORMAL LOW (ref 36.0–46.0)
Hemoglobin: 7.6 g/dL — ABNORMAL LOW (ref 12.0–15.0)
Immature Granulocytes: 1 %
Lymphocytes Relative: 41 %
Lymphs Abs: 1.2 10*3/uL (ref 0.7–4.0)
MCH: 27.1 pg (ref 26.0–34.0)
MCHC: 29.8 g/dL — ABNORMAL LOW (ref 30.0–36.0)
MCV: 91.1 fL (ref 80.0–100.0)
Monocytes Absolute: 0.1 10*3/uL (ref 0.1–1.0)
Monocytes Relative: 3 %
Neutro Abs: 1.6 10*3/uL — ABNORMAL LOW (ref 1.7–7.7)
Neutrophils Relative %: 54 %
Platelet Count: 185 10*3/uL (ref 150–400)
RBC: 2.8 MIL/uL — ABNORMAL LOW (ref 3.87–5.11)
RDW: 18.9 % — ABNORMAL HIGH (ref 11.5–15.5)
WBC Count: 2.9 10*3/uL — ABNORMAL LOW (ref 4.0–10.5)
nRBC: 0 % (ref 0.0–0.2)

## 2023-05-04 MED ORDER — PACLITAXEL PROTEIN-BOUND CHEMO INJECTION 100 MG
100.0000 mg/m2 | Freq: Once | INTRAVENOUS | Status: AC
  Administered 2023-05-04: 250 mg via INTRAVENOUS
  Filled 2023-05-04: qty 50

## 2023-05-04 MED ORDER — SODIUM CHLORIDE 0.9 % IV SOLN
162.6000 mg | Freq: Once | INTRAVENOUS | Status: AC
  Administered 2023-05-04: 160 mg via INTRAVENOUS
  Filled 2023-05-04: qty 16

## 2023-05-04 MED ORDER — DEXAMETHASONE SODIUM PHOSPHATE 10 MG/ML IJ SOLN
10.0000 mg | Freq: Once | INTRAMUSCULAR | Status: AC
  Administered 2023-05-04: 10 mg via INTRAVENOUS
  Filled 2023-05-04: qty 1

## 2023-05-04 MED ORDER — SODIUM CHLORIDE 0.9 % IV SOLN
Freq: Once | INTRAVENOUS | Status: AC
  Filled 2023-05-04: qty 250

## 2023-05-04 MED ORDER — PALONOSETRON HCL INJECTION 0.25 MG/5ML
0.2500 mg | Freq: Once | INTRAVENOUS | Status: AC
  Administered 2023-05-04: 0.25 mg via INTRAVENOUS
  Filled 2023-05-04: qty 5

## 2023-05-04 MED ORDER — HEPARIN SOD (PORK) LOCK FLUSH 100 UNIT/ML IV SOLN
500.0000 [IU] | Freq: Once | INTRAVENOUS | Status: DC | PRN
Start: 2023-05-04 — End: 2023-05-04
  Filled 2023-05-04: qty 5

## 2023-05-04 NOTE — Patient Instructions (Signed)
[facility] - A DEPT OF [facility]  Discharge Instructions: Thank you for choosing [facility] to provide your oncology and hematology care.  If you have a lab appointment with the Cancer Center, please go directly to the Cancer Center and check in at the registration area.  Wear comfortable clothing and clothing appropriate for easy access to any Portacath or PICC line.   We strive to give you quality time with your provider. You may need to reschedule your appointment if you arrive late (15 or more minutes).  Arriving late affects you and other patients whose appointments are after yours.  Also, if you miss three or more appointments without notifying the office, you may be dismissed from the clinic at the provider's discretion.      For prescription refill requests, have your pharmacy contact our office and allow 72 hours for refills to be completed.    Today you received the following chemotherapy and/or immunotherapy agents Abraxane, Carboplatin      To help prevent nausea and vomiting after your treatment, we encourage you to take your nausea medication as directed.  BELOW ARE SYMPTOMS THAT SHOULD BE REPORTED IMMEDIATELY: *FEVER GREATER THAN 100.4 F (38 C) OR HIGHER *CHILLS OR SWEATING *NAUSEA AND VOMITING THAT IS NOT CONTROLLED WITH YOUR NAUSEA MEDICATION *UNUSUAL SHORTNESS OF BREATH *UNUSUAL BRUISING OR BLEEDING *URINARY PROBLEMS (pain or burning when urinating, or frequent urination) *BOWEL PROBLEMS (unusual diarrhea, constipation, pain near the anus) TENDERNESS IN MOUTH AND THROAT WITH OR WITHOUT PRESENCE OF ULCERS (sore throat, sores in mouth, or a toothache) UNUSUAL RASH, SWELLING OR PAIN  UNUSUAL VAGINAL DISCHARGE OR ITCHING   Items with * indicate a potential emergency and should be followed up as soon as possible or go to the Emergency Department if any problems should occur.  Please show the CHEMOTHERAPY ALERT CARD or  IMMUNOTHERAPY ALERT CARD at check-in to the Emergency Department and triage nurse.  Should you have questions after your visit or need to cancel or reschedule your appointment, please contact [facility] - A DEPT OF [facility]  [phone] and follow the prompts.  Office hours are 8:00 a.m. to 4:30 p.m. Monday - Friday. Please note that voicemails left after 4:00 p.m. may not be returned until the following business day.  We are closed weekends and major holidays. You have access to a nurse at all times for urgent questions. Please call the main number to the clinic [phone] and follow the prompts.  For any non-urgent questions, you may also contact your provider using MyChart. We now offer e-Visits for anyone 20 and older to request care online for non-urgent symptoms. For details visit [email or web address].   Also download the MyChart app! Go to the app store, search "MyChart", open the app, select [place], and log in with your MyChart username and password.

## 2023-05-04 NOTE — Progress Notes (Signed)
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  This was followed by a diagnostic mammogram and ultrasound.  Mammogram showed 3.4 x 1.8 x 3.5 cm mass at the 3 o'clock position of the left breast.  There was also an intraductal mass located at the 12 o'clock position of the right breast.  No axillary adenopathy was noted.  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  Right breast mass showed fragments of epithelial proliferation with atypia differential diagnosis includes intraductal papilloma with at least atypical ductal hyperplasia.   Patient has a history of heart failure with reduced ejection fraction and atrial fibrillation for which she follows up with Dr. Darin Engels from cardiology.  Plan is to proceed with 12 weeks of weekly CarboTaxol chemotherapy along with Keytruda.  Anthracycline will not be given.  Patient could not tolerate Keytruda as evidenced by diarrhea and significant skin rash and was therefore stopped after 2 cycles.  Patient had significant skin rash that persisted despite stopping Keytruda and there was concern for  allergic reaction as well because of which Taxol was stopped after 4 cycles and she was switched to Abraxane    Interval history-overall she reports baseline fatigue which is unchanged.  Skin rash has remained stable  ECOG PS- 2 Pain scale- 0  Review of systems- Review of Systems  Constitutional:  Positive for malaise/fatigue. Negative for chills, fever and weight loss.  HENT:  Negative for congestion, ear discharge and nosebleeds.   Eyes:  Negative for blurred vision.  Respiratory:  Negative for cough, hemoptysis, sputum production, shortness of breath and wheezing.   Cardiovascular:  Negative for chest pain, palpitations, orthopnea and claudication.  Gastrointestinal:  Negative for abdominal pain, blood in stool, constipation, diarrhea, heartburn, melena, nausea and vomiting.  Genitourinary:  Negative for dysuria, flank pain, frequency, hematuria and urgency.  Musculoskeletal:  Negative for back pain, joint pain and myalgias.  Skin:  Positive for rash.  Neurological:  Negative for dizziness, tingling, focal weakness, seizures, weakness and headaches.  Endo/Heme/Allergies:  Does not bruise/bleed easily.  Psychiatric/Behavioral:  Negative for depression and suicidal ideas. The patient does not have insomnia.       Allergies  Allergen Reactions   Aspirin Other (See Comments)    Bleeding risk since patient is on xarelta   Ciprofloxacin Other (See Comments)    Other Reaction: RASH ITCHING    Codeine Other (See Comments)   Diazepam Other (See Comments) and Nausea And Vomiting   Dofetilide Other (See Comments)    Other Reaction: arrhythmia    Hydrocodone-Acetaminophen Other (See Comments) and Nausea And Vomiting   Morphine Nausea And Vomiting   Niacin Other (See Comments)    fatigue  Oxycodone-Acetaminophen Other (See Comments) and Nausea And Vomiting   Sotalol Other (See Comments)    Other Reaction: Prolonged QTc    Amiodarone Other (See Comments)    Affecting liver     Pyridoxine Other (See Comments)    Splitting headache   Sulfamethoxazole-Trimethoprim Swelling   Other Rash    EKG sticker electrodes extended time     Past Medical History:  Diagnosis Date   Atrial fibrillation (HCC)    Atrial fibrillation with RVR (HCC)    Breast cancer, left (HCC) 11/2022   CHF (congestive heart failure) (HCC)    Complication of anesthesia    respiratory depression; needs to sit up immediately after   Coronary artery disease    Edema of both lower extremities    GERD (gastroesophageal reflux disease)    Hypertension, essential    Hyperthyroidism    Morbid obesity with BMI of 45.0-49.9, adult (HCC)    Pneumonia    Pneumonia due to COVID-19 virus    Sick sinus syndrome (HCC)    Sleep apnea    Type 2 diabetes mellitus without complication (HCC)    Vitamin D deficiency      Past Surgical History:  Procedure Laterality Date   ABSCESS DRAINAGE     BREAST BIOPSY Left 01/15/2023   [place] Bx Left Ribbon - Path pending   breast biopsy Right 01/15/2023   [place] Bx Right Coil Clip - Path Pending   BREAST BIOPSY Right 01/15/2023   [place] RT BREAST BX W LOC DEV 1ST LESION IMG BX SPEC US GUIDE 01/15/2023 [facility]   BREAST BIOPSY Left 01/15/2023   [place] LT BREAST BX W LOC DEV 1ST LESION IMG BX SPEC US GUIDE 01/15/2023 [facility]   CARDIAC CATHETERIZATION     CARDIAC PACEMAKER PLACEMENT  06/26/2008   [employer] dual chamber   CESAREAN SECTION     x 3   CHOLECYSTECTOMY     PACEMAKER GENERATOR CHANGE  03/28/2016   PORTACATH PLACEMENT Right 01/31/2023   Procedure: INSERTION PORT-A-CATH;  Surgeon: Carolan Shiver, MD;  Location: [facility];  Service: General;  Laterality: Right;   TOTAL ABDOMINAL HYSTERECTOMY     TUBAL LIGATION      Social History   Socioeconomic History   Marital status: Single    Spouse name: Not on file   Number of children: 3   Years of education: Not on file   Highest education level: Not on file  Occupational History   Not on file   Tobacco Use   Smoking status: Never   Smokeless tobacco: Never  Vaping Use   Vaping status: Never Used  Substance and Sexual Activity   Alcohol use: Not Currently   Drug use: Never   Sexual activity: Not Currently  Other Topics Concern   Not on file  Social History Narrative   Not on file   Social Drivers of Health   Financial Resource Strain: Low Risk  (09/06/2022)   Received from [facility]   Overall Financial Resource Strain (CARDIA)    Difficulty of Paying Living Expenses: Not hard at all  Food Insecurity: No Food Insecurity (01/19/2023)   Hunger Vital Sign    Worried About Running Out of Food in the Last Year: Never true    Ran Out of Food in the Last Year: Never true  Transportation Needs: No Transportation Needs (01/19/2023)   PRAPARE - Administrator, Civil Service (Medical): No    Lack of Transportation (Non-Medical): No  Physical Activity: Not on file  Stress: Not on file  Social Connections: Not on file  Intimate Partner Violence: Not At Risk (01/19/2023)   Humiliation, Afraid, Rape, and Kick questionnaire    Fear of Current or Ex-Partner: No    Emotionally Abused: No    Physically Abused: No    Sexually Abused: No    Family History  Problem Relation Age of Onset   Mesothelioma Father      Current Outpatient Medications:    citalopram (CELEXA) 10 MG tablet, Take 10 mg by mouth daily., Disp: , Rfl:    clobetasol cream (TEMOVATE) 0.05 %, Apply topically., Disp: , Rfl:    dexamethasone (DECADRON) 4 MG tablet, Take 2 tablets daily for 2 days, start the day after chemotherapy. Take with food., Disp: 30 tablet, Rfl: 1   diphenoxylate-atropine (LOMOTIL) 2.5-0.025 MG tablet, Take 1 tablet by mouth 4 (four) times daily as needed for diarrhea or loose stools., Disp: 30 tablet, Rfl: 0   lidocaine-prilocaine (EMLA) cream, Apply to affected area once, Disp: 30 g, Rfl: 3   metoprolol succinate (TOPROL-XL) 25 MG 24 hr tablet, Take 12.5 mg by  mouth daily., Disp: , Rfl:    Multiple Vitamins-Minerals (CENTRUM SILVER 50+WOMEN PO), Take 1 tablet by mouth daily., Disp: , Rfl:    nitroGLYCERIN (NITROSTAT) 0.4 MG SL tablet, Place 0.4 mg under the tongue every 5 (five) minutes as needed for chest pain. (Patient not taking: Reported on 02/08/2023), Disp: , Rfl:    ondansetron (ZOFRAN) 4 MG tablet, Take 1 tablet (4 mg total) by mouth daily as needed for nausea or vomiting., Disp: 30 tablet, Rfl: 1   ondansetron (ZOFRAN) 8 MG tablet, Take 1 tablet (8 mg total) by mouth every 8 (eight) hours as needed for nausea or vomiting. Start on the third day after chemotherapy. (Patient not taking: Reported on 02/08/2023), Disp: 30 tablet, Rfl: 1   potassium chloride (KLOR-CON) 10 MEQ tablet, Take 30 mEq by mouth 2 (two) times daily. Am and 4 pm, Disp: , Rfl:    prochlorperazine (COMPAZINE) 10 MG tablet, Take 1 tablet (10 mg total) by mouth every 6 (six) hours as needed for nausea or vomiting., Disp: 30 tablet, Rfl: 1   rivaroxaban (XARELTO) 20 MG TABS tablet, Take 20 mg by mouth every evening., Disp: , Rfl:    torsemide (DEMADEX) 20 MG tablet, Take 40 mg by mouth daily in the afternoon. 4 pm, Disp: , Rfl:    triamcinolone ointment (KENALOG) 0.5 %, Apply 1 Application topically 2 (two) times daily as needed (itching from rash)., Disp: 30 g, Rfl: 1  Physical exam:  Vitals:   05/04/23 0842  BP: 121/64  Pulse: (!) 103  Resp: 19  Temp: (!) 97.1 F (36.2 C)  TempSrc: Tympanic  SpO2: 97%  Weight: 267 lb 8 oz (121.3 kg)   Physical Exam Cardiovascular:     Rate and Rhythm: Normal rate and regular rhythm.     Heart sounds: Normal heart sounds.  Pulmonary:     Effort: Pulmonary effort is normal.     Breath sounds: Normal breath sounds.  Musculoskeletal:     Right lower leg: Edema present.     Left lower leg: Edema present.  Skin:    General: Skin is warm and dry.     Comments: Diffuse erythematous rash involving bilateral forearms overall stable   Neurological:     Mental Status: She is alert and oriented to person, place, and time.  Latest Ref Rng & Units 05/04/2023    8:15 AM  CMP  Glucose 70 - 99 mg/dL 086   BUN 8 - 23 mg/dL 9   Creatinine 5.78 - 4.69 mg/dL 6.29   Sodium 528 - 413 mmol/L 140   Potassium 3.5 - 5.1 mmol/L 4.3   Chloride 98 - 111 mmol/L 107   CO2 22 - 32 mmol/L 27   Calcium 8.9 - 10.3 mg/dL 8.8   Total Protein 6.5 - 8.1 g/dL 6.6   Total Bilirubin <2.4 mg/dL 1.1   Alkaline Phos 38 - 126 U/L 57   AST 15 - 41 U/L 24   ALT 0 - 44 U/L 15       Latest Ref Rng & Units 05/04/2023    8:15 AM  CBC  WBC 4.0 - 10.5 K/uL 2.9   Hemoglobin 12.0 - 15.0 g/dL 7.6   Hematocrit 40.1 - 46.0 % 25.5   Platelets 150 - 400 K/uL 185      Assessment and plan- Patient is a 77 y.o. 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  White cell count is 2.9 today but ANC is more than 1.5.  She will receive 3 days of Neupogen starting Monday.  Next week will be her last chemotherapy.  Chemo-induced anemia: She will receive 1 unit of PRBC transfusion next week.  I will see her back in 1 month with CBC with differential CMP ferritin and iron studies B12 and folate.  She will be meeting with surgery soon to discuss lumpectomy   Visit Diagnosis 1. Symptomatic anemia   2. Antineoplastic chemotherapy induced anemia   3. Chemotherapy induced neutropenia (HCC)   4. Encounter for antineoplastic chemotherapy   5. Malignant neoplasm of upper-outer quadrant of left breast in female, estrogen receptor negative (HCC)      Dr. Owens Shark, MD, MPH [facility] [phone] 05/04/2023 9:06 AM

## 2023-05-05 ENCOUNTER — Other Ambulatory Visit: Payer: Self-pay

## 2023-05-07 ENCOUNTER — Other Ambulatory Visit: Payer: Self-pay

## 2023-05-07 ENCOUNTER — Ambulatory Visit: Payer: Medicare HMO

## 2023-05-07 ENCOUNTER — Inpatient Hospital Stay: Payer: Medicare HMO

## 2023-05-07 ENCOUNTER — Other Ambulatory Visit: Payer: Medicare HMO

## 2023-05-07 ENCOUNTER — Ambulatory Visit: Payer: Medicare HMO | Admitting: Oncology

## 2023-05-07 DIAGNOSIS — T451X5A Adverse effect of antineoplastic and immunosuppressive drugs, initial encounter: Secondary | ICD-10-CM

## 2023-05-07 DIAGNOSIS — C50412 Malignant neoplasm of upper-outer quadrant of left female breast: Secondary | ICD-10-CM | POA: Diagnosis not present

## 2023-05-07 MED ORDER — FILGRASTIM-SNDZ 480 MCG/0.8ML IJ SOSY
480.0000 ug | PREFILLED_SYRINGE | Freq: Once | INTRAMUSCULAR | Status: AC
  Administered 2023-05-07: 480 ug via SUBCUTANEOUS
  Filled 2023-05-07: qty 0.8

## 2023-05-08 ENCOUNTER — Inpatient Hospital Stay: Payer: Medicare HMO

## 2023-05-08 ENCOUNTER — Ambulatory Visit: Payer: Medicare HMO

## 2023-05-08 DIAGNOSIS — T451X5A Adverse effect of antineoplastic and immunosuppressive drugs, initial encounter: Secondary | ICD-10-CM

## 2023-05-08 DIAGNOSIS — C50412 Malignant neoplasm of upper-outer quadrant of left female breast: Secondary | ICD-10-CM | POA: Diagnosis not present

## 2023-05-08 LAB — PREPARE RBC (CROSSMATCH)

## 2023-05-08 MED ORDER — FILGRASTIM-SNDZ 480 MCG/0.8ML IJ SOSY
480.0000 ug | PREFILLED_SYRINGE | Freq: Every day | INTRAMUSCULAR | Status: DC
Start: 2023-05-08 — End: 2023-05-08
  Administered 2023-05-08: 480 ug via SUBCUTANEOUS
  Filled 2023-05-08: qty 0.8

## 2023-05-09 ENCOUNTER — Inpatient Hospital Stay: Payer: Medicare HMO

## 2023-05-09 ENCOUNTER — Other Ambulatory Visit: Payer: Self-pay

## 2023-05-09 VITALS — BP 112/56 | HR 80 | Temp 99.1°F | Resp 20

## 2023-05-09 DIAGNOSIS — D649 Anemia, unspecified: Secondary | ICD-10-CM

## 2023-05-09 DIAGNOSIS — C50412 Malignant neoplasm of upper-outer quadrant of left female breast: Secondary | ICD-10-CM | POA: Diagnosis not present

## 2023-05-09 DIAGNOSIS — T451X5A Adverse effect of antineoplastic and immunosuppressive drugs, initial encounter: Secondary | ICD-10-CM

## 2023-05-09 MED ORDER — FILGRASTIM-SNDZ 480 MCG/0.8ML IJ SOSY
480.0000 ug | PREFILLED_SYRINGE | Freq: Every day | INTRAMUSCULAR | Status: DC
  Administered 2023-05-09: 480 ug via SUBCUTANEOUS
  Filled 2023-05-09: qty 0.8

## 2023-05-09 MED ORDER — SODIUM CHLORIDE 0.9% IV SOLUTION
250.0000 mL | INTRAVENOUS | Status: DC
  Administered 2023-05-09: 100 mL via INTRAVENOUS
  Filled 2023-05-09: qty 250

## 2023-05-09 MED ORDER — HEPARIN SOD (PORK) LOCK FLUSH 100 UNIT/ML IV SOLN
500.0000 [IU] | Freq: Every day | INTRAVENOUS | Status: AC | PRN
Start: 2023-05-09 — End: 2023-05-09
  Administered 2023-05-09: 500 [IU]
  Filled 2023-05-09: qty 5

## 2023-05-10 LAB — BPAM RBC
Blood Product Expiration Date: 202501062359
ISSUE DATE / TIME: 202412181324
Unit Type and Rh: 202501062359
Unit Type and Rh: 6200

## 2023-05-10 LAB — TYPE AND SCREEN
ABO/RH(D): A POS
Antibody Screen: NEGATIVE
Unit division: 0

## 2023-05-11 ENCOUNTER — Encounter: Payer: Self-pay | Admitting: Emergency Medicine

## 2023-05-11 ENCOUNTER — Inpatient Hospital Stay: Payer: Medicare HMO | Admitting: Oncology

## 2023-05-11 ENCOUNTER — Ambulatory Visit: Payer: Medicare HMO

## 2023-05-11 ENCOUNTER — Emergency Department: Payer: Medicare HMO

## 2023-05-11 ENCOUNTER — Other Ambulatory Visit: Payer: Self-pay

## 2023-05-11 ENCOUNTER — Inpatient Hospital Stay: Payer: Medicare HMO

## 2023-05-11 ENCOUNTER — Observation Stay
Admission: EM | Admit: 2023-05-11 | Discharge: 2023-05-13 | Disposition: A | Discharge status: Home / Self Care | Payer: Medicare HMO | Attending: Internal Medicine | Admitting: Internal Medicine

## 2023-05-11 DIAGNOSIS — R5381 Other malaise: Secondary | ICD-10-CM | POA: Diagnosis not present

## 2023-05-11 DIAGNOSIS — E119 Type 2 diabetes mellitus without complications: Secondary | ICD-10-CM | POA: Diagnosis not present

## 2023-05-11 DIAGNOSIS — I2510 Atherosclerotic heart disease of native coronary artery without angina pectoris: Secondary | ICD-10-CM | POA: Diagnosis not present

## 2023-05-11 DIAGNOSIS — I110 Hypertensive heart disease with heart failure: Secondary | ICD-10-CM | POA: Insufficient documentation

## 2023-05-11 DIAGNOSIS — R262 Difficulty in walking, not elsewhere classified: Secondary | ICD-10-CM | POA: Diagnosis not present

## 2023-05-11 DIAGNOSIS — D6481 Anemia due to antineoplastic chemotherapy: Secondary | ICD-10-CM | POA: Diagnosis not present

## 2023-05-11 DIAGNOSIS — E669 Obesity, unspecified: Secondary | ICD-10-CM | POA: Insufficient documentation

## 2023-05-11 DIAGNOSIS — Z1152 Encounter for screening for COVID-19: Secondary | ICD-10-CM | POA: Insufficient documentation

## 2023-05-11 DIAGNOSIS — L89309 Pressure ulcer of unspecified buttock, unspecified stage: Principal | ICD-10-CM

## 2023-05-11 DIAGNOSIS — A419 Sepsis, unspecified organism: Secondary | ICD-10-CM | POA: Insufficient documentation

## 2023-05-11 DIAGNOSIS — L89329 Pressure ulcer of left buttock, unspecified stage: Secondary | ICD-10-CM | POA: Diagnosis not present

## 2023-05-11 DIAGNOSIS — I509 Heart failure, unspecified: Secondary | ICD-10-CM | POA: Insufficient documentation

## 2023-05-11 DIAGNOSIS — R5383 Other fatigue: Secondary | ICD-10-CM | POA: Diagnosis not present

## 2023-05-11 DIAGNOSIS — T451X5A Adverse effect of antineoplastic and immunosuppressive drugs, initial encounter: Secondary | ICD-10-CM | POA: Diagnosis not present

## 2023-05-11 DIAGNOSIS — R0602 Shortness of breath: Principal | ICD-10-CM | POA: Diagnosis present

## 2023-05-11 DIAGNOSIS — I480 Paroxysmal atrial fibrillation: Secondary | ICD-10-CM | POA: Diagnosis not present

## 2023-05-11 DIAGNOSIS — R1310 Dysphagia, unspecified: Secondary | ICD-10-CM | POA: Diagnosis not present

## 2023-05-11 DIAGNOSIS — J690 Pneumonitis due to inhalation of food and vomit: Secondary | ICD-10-CM | POA: Diagnosis not present

## 2023-05-11 DIAGNOSIS — R531 Weakness: Secondary | ICD-10-CM | POA: Diagnosis not present

## 2023-05-11 DIAGNOSIS — L89319 Pressure ulcer of right buttock, unspecified stage: Secondary | ICD-10-CM | POA: Insufficient documentation

## 2023-05-11 DIAGNOSIS — D61818 Other pancytopenia: Secondary | ICD-10-CM | POA: Diagnosis not present

## 2023-05-11 DIAGNOSIS — C50911 Malignant neoplasm of unspecified site of right female breast: Secondary | ICD-10-CM | POA: Insufficient documentation

## 2023-05-11 DIAGNOSIS — J029 Acute pharyngitis, unspecified: Secondary | ICD-10-CM | POA: Diagnosis not present

## 2023-05-11 LAB — COMPREHENSIVE METABOLIC PANEL
ALT: 10 U/L (ref 0–44)
AST: 16 U/L (ref 15–41)
Albumin: 2.6 g/dL — ABNORMAL LOW (ref 3.5–5.0)
Alkaline Phosphatase: 66 U/L (ref 38–126)
Anion gap: 10 (ref 5–15)
BUN: 12 mg/dL (ref 8–23)
CO2: 27 mmol/L (ref 22–32)
Calcium: 8.4 mg/dL — ABNORMAL LOW (ref 8.9–10.3)
Chloride: 100 mmol/L (ref 98–111)
Creatinine, Ser: 0.82 mg/dL (ref 0.44–1.00)
GFR, Estimated: >60 mL/min (ref >60)
Glucose, Bld: 105 mg/dL — ABNORMAL HIGH (ref 70–99)
Potassium: 3.6 mmol/L (ref 3.5–5.1)
Sodium: 137 mmol/L (ref 135–145)
Total Bilirubin: 2 mg/dL — ABNORMAL HIGH (ref <1.2)
Total Protein: 6.1 g/dL — ABNORMAL LOW (ref 6.5–8.1)

## 2023-05-11 LAB — LACTIC ACID, PLASMA
Lactic Acid, Venous: 1.4 mmol/L (ref 0.5–1.9)
Lactic Acid, Venous: 1.2 mmol/L (ref 0.5–1.9)

## 2023-05-11 LAB — PROTIME-INR
INR: 1.5 — ABNORMAL HIGH (ref 0.8–1.2)
Prothrombin Time: 18.6 s — ABNORMAL HIGH (ref 11.4–15.2)

## 2023-05-11 LAB — URINALYSIS, ROUTINE W REFLEX MICROSCOPIC
Bacteria, UA: NONE SEEN
Bilirubin Urine: NEGATIVE
Glucose, UA: NEGATIVE mg/dL
Ketones, ur: 20 mg/dL — AB
Leukocytes,Ua: NEGATIVE
Nitrite: NEGATIVE
Protein, ur: 30 mg/dL — AB
RBC / HPF: >50 RBC/hpf (ref 0–5)
Specific Gravity, Urine: >1.046 — ABNORMAL HIGH (ref 1.005–1.030)
pH: 5 (ref 5.0–8.0)

## 2023-05-11 LAB — CBC
HCT: 23.9 % — ABNORMAL LOW (ref 36.0–46.0)
Hemoglobin: 7.2 g/dL — ABNORMAL LOW (ref 12.0–15.0)
MCH: 27.6 pg (ref 26.0–34.0)
MCHC: 30.1 g/dL (ref 30.0–36.0)
MCV: 91.6 fL (ref 80.0–100.0)
Platelets: 119 10*3/uL — ABNORMAL LOW (ref 150–400)
RBC: 2.61 MIL/uL — ABNORMAL LOW (ref 3.87–5.11)
RDW: 19.1 % — ABNORMAL HIGH (ref 11.5–15.5)
WBC: 1.8 10*3/uL — ABNORMAL LOW (ref 4.0–10.5)
nRBC: 2.7 % — ABNORMAL HIGH (ref 0.0–0.2)

## 2023-05-11 LAB — PREPARE RBC (CROSSMATCH)

## 2023-05-11 LAB — RESP PANEL BY RT-PCR (RSV, FLU A&B, COVID)  RVPGX2
Influenza A by PCR: NEGATIVE
Influenza B by PCR: NEGATIVE
Resp Syncytial Virus by PCR: NEGATIVE
SARS Coronavirus 2 by RT PCR: NEGATIVE

## 2023-05-11 LAB — PROCALCITONIN: Procalcitonin: <0.1 ng/mL

## 2023-05-11 MED ORDER — RIVAROXABAN 20 MG PO TABS
20.0000 mg | ORAL_TABLET | Freq: Every evening | ORAL | Status: DC
  Administered 2023-05-11 – 2023-05-12 (×2): 20 mg via ORAL
  Filled 2023-05-11 (×3): qty 1

## 2023-05-11 MED ORDER — ENSURE ENLIVE PO LIQD
237.0000 mL | Freq: Two times a day (BID) | ORAL | Status: DC
  Administered 2023-05-12 (×2): 237 mL via ORAL

## 2023-05-11 MED ORDER — ALBUTEROL SULFATE (2.5 MG/3ML) 0.083% IN NEBU: 2.5000 mg | INHALATION_SOLUTION | RESPIRATORY_TRACT | Status: DC | PRN

## 2023-05-11 MED ORDER — PHENOL 1.4 % MT LIQD: 1.0000 | OROMUCOSAL | Status: DC | PRN

## 2023-05-11 MED ORDER — SODIUM CHLORIDE 0.9% IV SOLUTION
Freq: Once | INTRAVENOUS | Status: DC
  Filled 2023-05-11: qty 250

## 2023-05-11 MED ORDER — CITALOPRAM HYDROBROMIDE 20 MG PO TABS
10.0000 mg | ORAL_TABLET | Freq: Every day | ORAL | Status: DC
  Administered 2023-05-12 – 2023-05-13 (×2): 10 mg via ORAL
  Filled 2023-05-11 (×2): qty 1

## 2023-05-11 MED ORDER — KCL IN DEXTROSE-NACL 20-5-0.45 MEQ/L-%-% IV SOLN
INTRAVENOUS | Status: AC
  Filled 2023-05-11: qty 1000

## 2023-05-11 MED ORDER — SODIUM CHLORIDE 0.9 % IV SOLN
3.0000 g | Freq: Four times a day (QID) | INTRAVENOUS | Status: DC
  Administered 2023-05-11 – 2023-05-12 (×3): 3 g via INTRAVENOUS
  Filled 2023-05-11 (×3): qty 8

## 2023-05-11 MED ORDER — THIAMINE MONONITRATE 100 MG PO TABS
100.0000 mg | ORAL_TABLET | Freq: Every day | ORAL | Status: DC
  Administered 2023-05-11 – 2023-05-13 (×3): 100 mg via ORAL
  Filled 2023-05-11 (×3): qty 1

## 2023-05-11 MED ORDER — MAGIC MOUTHWASH W/LIDOCAINE
15.0000 mL | Freq: Four times a day (QID) | ORAL | Status: DC
  Administered 2023-05-11: 15 mL via ORAL
  Filled 2023-05-11 (×6): qty 15

## 2023-05-11 MED ORDER — ACETAMINOPHEN 325 MG PO TABS: 650.0000 mg | ORAL_TABLET | Freq: Four times a day (QID) | ORAL | Status: DC | PRN

## 2023-05-11 MED ORDER — DIPHENOXYLATE-ATROPINE 2.5-0.025 MG PO TABS: 1.0000 | ORAL_TABLET | Freq: Four times a day (QID) | ORAL | Status: DC | PRN

## 2023-05-11 MED ORDER — LACTATED RINGERS IV BOLUS: 500.0000 mL | Freq: Once | INTRAVENOUS | Status: DC

## 2023-05-11 MED ORDER — SODIUM CHLORIDE 0.9 % IV BOLUS
500.0000 mL | Freq: Once | INTRAVENOUS | Status: AC
  Administered 2023-05-11: 500 mL via INTRAVENOUS

## 2023-05-11 MED ORDER — ONDANSETRON HCL 4 MG PO TABS: 4.0000 mg | ORAL_TABLET | Freq: Four times a day (QID) | ORAL | Status: DC | PRN

## 2023-05-11 MED ORDER — METOPROLOL SUCCINATE ER 25 MG PO TB24
12.5000 mg | ORAL_TABLET | Freq: Every day | ORAL | Status: DC
  Administered 2023-05-12 – 2023-05-13 (×2): 12.5 mg via ORAL
  Filled 2023-05-11 (×2): qty 1

## 2023-05-11 MED ORDER — ACETAMINOPHEN 650 MG RE SUPP: 650.0000 mg | Freq: Four times a day (QID) | RECTAL | Status: DC | PRN

## 2023-05-11 MED ORDER — PIPERACILLIN-TAZOBACTAM 3.375 G IVPB
3.3750 g | Freq: Once | INTRAVENOUS | Status: AC
  Administered 2023-05-11: 3.375 g via INTRAVENOUS
  Filled 2023-05-11: qty 50

## 2023-05-11 MED ORDER — ONDANSETRON HCL 4 MG/2ML IJ SOLN: 4.0000 mg | Freq: Four times a day (QID) | INTRAMUSCULAR | Status: DC | PRN

## 2023-05-11 MED ORDER — IOHEXOL 300 MG/ML  SOLN
75.0000 mL | Freq: Once | INTRAMUSCULAR | Status: AC | PRN
  Administered 2023-05-11: 75 mL via INTRAVENOUS

## 2023-05-11 MED ORDER — FOLIC ACID 1 MG PO TABS
1.0000 mg | ORAL_TABLET | Freq: Every day | ORAL | Status: DC
  Administered 2023-05-11 – 2023-05-13 (×3): 1 mg via ORAL
  Filled 2023-05-11 (×3): qty 1

## 2023-05-11 NOTE — Progress Notes (Signed)
Pharmacy Antibiotic Note  Jennifer Gregory is a 77 y.o. female admitted on 05/11/2023 with  aspiration pneumonia .  Patient is presenting after a few days of low-grade fevers and difficulty swallowing. PMH significant for stage II breast cancer, CHF, afib (Xarelto PTA), and chemotherapy-induced anemia. In ED, patient is afebrile, WBC 1.8, lactic acid 1.2. Pharmacy has been consulted for Unasyn dosing.  Plan: Start Unasyn 3 g IV every 6 hours Monitor renal function, clinical status, culture data, and LOT  Temp (24hrs), Avg:98.8 F (37.1 C), Min:98.8 F (37.1 C), Max:98.8 F (37.1 C)  Recent Labs  Lab 05/11/23 1033 05/11/23 1137 05/11/23 1301  WBC  --  1.8*  --   CREATININE 0.82  --   --   LATICACIDVEN 1.4  --  1.2    Estimated Creatinine Clearance: 72.6 mL/min (by C-G formula based on SCr of 0.82 mg/dL).    Allergies  Allergen Reactions   Aspirin Other (See Comments)    Bleeding risk since patient is on xarelta   Ciprofloxacin Other (See Comments)    Other Reaction: RASH ITCHING    Codeine Other (See Comments)   Diazepam Other (See Comments) and Nausea And Vomiting   Dofetilide Other (See Comments)    Other Reaction: arrhythmia    Hydrocodone-Acetaminophen Other (See Comments) and Nausea And Vomiting   Morphine Nausea And Vomiting   Niacin Other (See Comments)    fatigue   Oxycodone-Acetaminophen Other (See Comments) and Nausea And Vomiting   Sotalol Other (See Comments)    Other Reaction: Prolonged QTc    Amiodarone Other (See Comments)    Affecting liver    Pyridoxine Other (See Comments)    Splitting headache   Sulfamethoxazole-Trimethoprim Swelling   Other Rash    EKG sticker electrodes extended time   Antimicrobials this admission: Zosyn 12/20 x1 Unasyn 12/20 >>   Dose adjustments this admission: N/A  Microbiology results: 12/20 BCx: pending  Thank you for involving pharmacy in this patient's care.   Rockwell Alexandria, PharmD Clinical  Pharmacist 05/11/2023 3:30 PM

## 2023-05-11 NOTE — Consult Note (Signed)
ED Pharmacy Antibiotic Sign Off An antibiotic consult was received from an ED provider for Zosyn per pharmacy dosing for aspiration pneumonia. A chart review was completed to assess appropriateness.   The following one time order(s) were placed:  --Zosyn 3.375 g IV over 30 mins  Further antibiotic and/or antibiotic pharmacy consults should be ordered by the admitting provider if indicated.   Thank you for allowing pharmacy to be a part of this patient's care.   Tressie Ellis, [facility]  05/11/23 2:24 PM

## 2023-05-11 NOTE — ED Provider Notes (Signed)
[facility] Provider Note    Event Date/Time   First MD Initiated Contact with Patient 05/11/23 [phone]     (approximate)   History   Sore Throat and Dysphagia   HPI  Jennifer Gregory is a 77 y.o. female history of stage II breast cancer left as well as a history of heart failure and atrial fibrillation.  Chemotherapy induced anemia   Patient reports that she has had some very low-grade fevers like 99.7 over the last 3 to 4 days.  First noticed it around the time of her most recent chemo session.  She was supposed to receive her last chemotherapy session today  She is also been having some slightly hoarse voice a bit of difficulty swallowing.  Denies pain.  She does not have very slight cough.  No vomiting no diarrhea.  She feels like she has just a little bit of a sinus infection about 2 weeks ago, now with difficulty swallowing and hoarseness of voice  She is able to drink small amounts of fluids and liquids but advises if she tries to eat something solid or drink a large gulp of fluid it did feel like it will get stuck in her throat area and she will have to spit it back up.  Physical Exam   Triage Vital Signs: ED Triage Vitals [05/11/23 0932]  Encounter Vitals Group     BP (!) 124/53     Systolic BP Percentile      Diastolic BP Percentile      Pulse Rate (!) 112     Resp 16     Temp 98.8 F (37.1 C)     Temp Source Oral     SpO2 98 %     Weight      Height      Head Circumference      Peak Flow      Pain Score      Pain Loc      Pain Education      Exclude from Growth Chart     Most recent vital signs: Vitals:   05/11/23 0932  BP: (!) 124/53  Pulse: (!) 112  Resp: 16  Temp: 98.8 F (37.1 C)  SpO2: 98%     General: Awake, no distress.  Her voice seems just slightly hoarse.  She is clearing her throat from time to time.  She is managing her secretions well.  In the posterior oropharynx is not injected and widely patent.  There is no  anterior neck swelling  She reports she is not having any sore throat but rather just feels like a slight discomfort or uncomfortable feeling around her voicebox  CV:  Good peripheral perfusion.  Normal tones Resp:  Normal effort.  Slightly diminished in the bases, questionable slight crackles in right lung base Abd:  No distention.  Soft nontender nondistended throughout Other:  Right upper chest Port-A-Cath site clean dry intact   ED Results / Procedures / Treatments   Labs (all labs ordered are listed, but only abnormal results are displayed) Labs Reviewed  COMPREHENSIVE METABOLIC PANEL - Abnormal; Notable for the following components:      Result Value   Glucose, Bld 105 (*)    Calcium 8.4 (*)    Total Protein 6.1 (*)    Albumin 2.6 (*)    Total Bilirubin 2.0 (*)    All other components within normal limits  URINALYSIS, ROUTINE W REFLEX MICROSCOPIC - Abnormal; Notable for the following  components:   Color, Urine AMBER (*)    APPearance HAZY (*)    Specific Gravity, Urine >1.046 (*)    Hgb urine dipstick LARGE (*)    Ketones, ur 20 (*)    Protein, ur 30 (*)    All other components within normal limits  CBC - Abnormal; Notable for the following components:   WBC 1.8 (*)    RBC 2.61 (*)    Hemoglobin 7.2 (*)    HCT 23.9 (*)    RDW 19.1 (*)    Platelets 119 (*)    nRBC 2.7 (*)    All other components within normal limits  PROTIME-INR - Abnormal; Notable for the following components:   Prothrombin Time 18.6 (*)    INR 1.5 (*)    All other components within normal limits  RESP PANEL BY RT-PCR (RSV, FLU A&B, COVID)  [id number]  CULTURE, BLOOD (ROUTINE X 2)  CULTURE, BLOOD (ROUTINE X 2)  LACTIC ACID, PLASMA  LACTIC ACID, PLASMA  CBC WITH DIFFERENTIAL/PLATELET  PROCALCITONIN  TYPE AND SCREEN      RADIOLOGY  Chest x-ray interpreted by me as questionable infiltrate in the bases bilaterally  DG Chest 2 View Result Date: 05/11/2023 CLINICAL DATA:  Low-grade fever.  EXAM: CHEST - 2 VIEW COMPARISON:  01/31/2023 FINDINGS: The cardio pericardial silhouette is enlarged. left-sided permanent pacemaker again noted. Similar appearance of streaky basilar densities suggesting atelectasis. Right Port-A-Cath again noted. Telemetry leads overlie the chest. IMPRESSION: Similar appearance of streaky basilar densities suggesting atelectasis. Electronically Signed   By: Kennith Center M.D.   On: 05/11/2023 13:05   CT Soft Tissue Neck W Contrast Result Date: 05/11/2023 CLINICAL DATA:  Hoarse voice, difficulty swallowing, history of breast cancer EXAM: CT NECK WITH CONTRAST TECHNIQUE: Multidetector CT imaging of the neck was performed using the standard protocol following the bolus administration of intravenous contrast. RADIATION DOSE REDUCTION: This exam was performed according to the departmental dose-optimization program which includes automated exposure control, adjustment of the mA and/or kV according to patient size and/or use of iterative reconstruction technique. CONTRAST:  75mL OMNIPAQUE IOHEXOL 300 MG/ML  SOLN COMPARISON:  CT head and cervical spine 07/02/2021 FINDINGS: Pharynx and larynx: Normal. No mass or swelling. Salivary glands: No inflammation, mass, or stone. Thyroid: Normal. Lymph nodes: None enlarged or abnormal density. Vascular: Aortic atherosclerosis. The major arteries and veins of the neck patent. Right chest port with catheter in the right IJ. Limited intracranial: Negative. Visualized orbits: Negative. Mastoids and visualized paranasal sinuses: Mucosal thickening in the ethmoid air cells. Otherwise clear paranasal sinuses. The mastoids are well aerated. Skeleton: 6 mm lucent lesion in the left frontal bone (series 2, image 3), which appears unchanged from the 07/02/2021 CT head. Degenerative changes in the cervical spine. No acute osseous abnormality. Upper chest: No focal pulmonary opacity or pleural effusion. IMPRESSION: 1. No acute process in the neck. 2. 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 3. Aortic atherosclerosis. Aortic Atherosclerosis (ICD10-I70.0). Electronically Signed   By: Wiliam Ke M.D.   On: 05/11/2023 12:51      PROCEDURES:  Critical Care performed: No  Procedures   MEDICATIONS ORDERED IN ED: Medications  piperacillin-tazobactam (ZOSYN) IVPB 3.375 g (has no administration in time range)  sodium chloride 0.9 % bolus 500 mL (500 mLs Intravenous New Bag/Given 05/11/23 1316)  iohexol (OMNIPAQUE) 300 MG/ML solution 75 mL (75 mLs Intravenous Contrast Given 05/11/23 1144)  IMPRESSION / MDM / ASSESSMENT AND PLAN / ED COURSE  I reviewed the triage vital signs and the nursing notes.                              Differential diagnosis includes, but is not limited to, possible pharyngitis, mass lesion, compressive lesion, abscess, etc.  The patient is currently on chemotherapy.  She reports a slight sinus congestion 2 weeks ago then followed by a few days of very low-grade fevers which resolved yesterday.  She is awake and alert but has slight dysphagia, slight dysphonia, voice and minimally hoarse.  She is managing secretions at this time, but certainly at increased risk for potential infection, mass lesion etc.  She also reports a slight cough though her lung sounds are clear and her work of breathing normal.  Heart rate slightly elevated  Patient's presentation is most consistent with acute complicated illness / injury requiring diagnostic workup.   ----------------------------------------- 2:46 PM on 05/11/2023 ----------------------------------------- Patient noted to have small bedsores on the buttock bilaterally.  They are relatively shallow with some very slight amount of bleeding.  No purulence or surrounding erythema or mounding to suggest underlying infection.  Patient reports her physician aware of these and they have been  present for quite some time without change  Consulted with the hospitalist.  Started the patient on Zosyn for concerns of pneumonia possibly aspiration like.  She does have leukopenia, currently on chemotherapy, and given her chest x-ray findings slightly elevated heart rate and concerned about potential bacterial infection.  Hospitalist agreeable with plan for admission.  Discussed obtaining procalcitonin and noncontrast chest CT.  Patient has already had 1 contrasted study today.  Chest CT to further evaluate for possible infiltrate of the lung or pneumonia.  Patient agreeable understanding with plan for admission.       FINAL CLINICAL IMPRESSION(S) / ED DIAGNOSES   Final diagnoses:  Pressure injury of skin of buttock, unspecified injury stage, unspecified laterality  Aspiration pneumonia of both lower lobes, unspecified aspiration pneumonia type (HCC)  Sepsis, due to unspecified organism, unspecified whether acute organ dysfunction present [facility])     Rx / DC Orders   ED Discharge Orders     None        Note:  This document was prepared using Dragon voice recognition software and may include unintentional dictation errors.   Sharyn Creamer, MD 05/11/23 1447

## 2023-05-11 NOTE — Sepsis Progress Note (Signed)
Sepsis protocol monitored by eLink ?

## 2023-05-11 NOTE — H&P (Signed)
History and Physical    Jennifer Gregory UVO:[id number] DOB: [birth year] DOA: 05/11/2023  PCP: Rayetta Humphrey, MD (Confirm with patient/family/NH records and if not entered, this has to be entered at [facility] point of entry) Patient coming from: Home  I have personally briefly reviewed patient's old medical records in [facility]  Chief Complaint: Fatigue, cough, sore throat  HPI: Jennifer Gregory This is a pleasant 77 year old female history of stage II breast cancer on active chemotherapy as well as atrial fibrillation.  Heart failure documented in her documentation but last echocardiogram EF 50 to 55% with normal right ventricular systolic function.  Patient is actively receiving chemotherapy for breast cancer.  Stated her last treatment was Friday the week prior to admission.  Subsequently she has been appearing extremely weak and fatigued exacerbated by poor p.o. intake, hoarse voice, sore throat, mild cough.  Also with low-grade temperatures Tmax 99.8.  On my evaluation patient is resting in bed.  She is visibly fatigued but otherwise stable.  No specific pain complaints.  Vital signs overall stable.  Laboratory investigation significant for pancytopenia.  Metabolic profile overall reassuring.  CT neck reassuring.  Discussed case with EDP.  ED Course: After imaging laboratory investigation patient received 1 dose of intravenous Zosyn.  Hospitalist contacted for admission.  Review of Systems: As per HPI otherwise 14 point review of systems negative.    Past Medical History:  Diagnosis Date   Atrial fibrillation [facility])    Atrial fibrillation with RVR (HCC)    Breast cancer, left (HCC) 11/2022   CHF (congestive heart failure) (HCC)    Complication of anesthesia    respiratory depression; needs to sit up immediately after   Coronary artery disease    Edema of both lower extremities    GERD (gastroesophageal reflux disease)    Hypertension, essential    Hyperthyroidism    Morbid  obesity with BMI of 45.0-49.9, adult (HCC)    Pneumonia    Pneumonia due to COVID-19 virus    Sick sinus syndrome (HCC)    Sleep apnea    Type 2 diabetes mellitus without complication (HCC)    Vitamin D deficiency     Past Surgical History:  Procedure Laterality Date   ABSCESS DRAINAGE     BREAST BIOPSY Left 01/15/2023   [place] Bx Left Ribbon - Path pending   breast biopsy Right 01/15/2023   [place] Bx Right Coil Clip - Path Pending   BREAST BIOPSY Right 01/15/2023   [place] RT BREAST BX W LOC DEV 1ST LESION IMG BX SPEC US GUIDE 01/15/2023 [facility]   BREAST BIOPSY Left 01/15/2023   [place] LT BREAST BX W LOC DEV 1ST LESION IMG BX SPEC US GUIDE 01/15/2023 [facility]   CARDIAC CATHETERIZATION     CARDIAC PACEMAKER PLACEMENT  06/26/2008   [employer] Jude dual chamber   CESAREAN SECTION     x 3   CHOLECYSTECTOMY     PACEMAKER GENERATOR CHANGE  03/28/2016   PORTACATH PLACEMENT Right 01/31/2023   Procedure: INSERTION PORT-A-CATH;  Surgeon: Carolan Shiver, MD;  Location: [facility] ORS;  Service: General;  Laterality: Right;   TOTAL ABDOMINAL HYSTERECTOMY     TUBAL LIGATION       reports that she has never smoked. She has never used smokeless tobacco. She reports that she does not currently use alcohol. She reports that she does not use drugs.  Allergies  Allergen Reactions   Aspirin Other (See Comments)    Bleeding risk since patient is on xarelta  Ciprofloxacin Other (See Comments)    Other Reaction: RASH ITCHING    Codeine Other (See Comments)   Diazepam Other (See Comments) and Nausea And Vomiting   Dofetilide Other (See Comments)    Other Reaction: arrhythmia    Hydrocodone-Acetaminophen Other (See Comments) and Nausea And Vomiting   Morphine Nausea And Vomiting   Niacin Other (See Comments)    fatigue   Oxycodone-Acetaminophen Other (See Comments) and Nausea And Vomiting   Sotalol Other (See Comments)    Other Reaction: Prolonged QTc    Amiodarone Other (See Comments)     Affecting liver    Pyridoxine Other (See Comments)    Splitting headache   Sulfamethoxazole-Trimethoprim Swelling   Other Rash    EKG sticker electrodes extended time    Family History  Problem Relation Age of Onset   Mesothelioma Father     Prior to Admission medications   Medication Sig Start Date End Date Taking? Authorizing Provider  citalopram (CELEXA) 10 MG tablet Take 10 mg by mouth daily.    [provider]  clobetasol cream (TEMOVATE) 0.05 % Apply topically. 04/03/23   [provider]  dexamethasone (DECADRON) 4 MG tablet Take 2 tablets daily for 2 days, start the day after chemotherapy. Take with food. 01/19/23   Creig Hines, MD  diphenoxylate-atropine (LOMOTIL) 2.5-0.025 MG tablet Take 1 tablet by mouth 4 (four) times daily as needed for diarrhea or loose stools. 03/09/23   Borders, Daryl Eastern, NP  lidocaine-prilocaine (EMLA) cream Apply to affected area once 01/19/23   Creig Hines, MD  metoprolol succinate (TOPROL-XL) 25 MG 24 hr tablet Take 12.5 mg by mouth daily.    [provider]  Multiple Vitamins-Minerals (CENTRUM SILVER 50+WOMEN PO) Take 1 tablet by mouth daily.    [provider]  nitroGLYCERIN (NITROSTAT) 0.4 MG SL tablet Place 0.4 mg under the tongue every 5 (five) minutes as needed for chest pain. Patient not taking: Reported on 02/08/2023    [provider]  ondansetron (ZOFRAN) 4 MG tablet Take 1 tablet (4 mg total) by mouth daily as needed for nausea or vomiting. 01/31/23 01/31/24  Carolan Shiver, MD  ondansetron (ZOFRAN) 8 MG tablet Take 1 tablet (8 mg total) by mouth every 8 (eight) hours as needed for nausea or vomiting. Start on the third day after chemotherapy. Patient not taking: Reported on 02/08/2023 01/19/23   Creig Hines, MD  potassium chloride (KLOR-CON) 10 MEQ tablet Take 30 mEq by mouth 2 (two) times daily. Am and 4 pm 07/14/19   [provider]  prochlorperazine (COMPAZINE) 10 MG tablet  Take 1 tablet (10 mg total) by mouth every 6 (six) hours as needed for nausea or vomiting. 01/19/23   Creig Hines, MD  rivaroxaban (XARELTO) 20 MG TABS tablet Take 20 mg by mouth every evening. 05/30/19   [provider]  torsemide (DEMADEX) 20 MG tablet Take 40 mg by mouth daily in the afternoon. 4 pm    [provider]  triamcinolone ointment (KENALOG) 0.5 % Apply 1 Application topically 2 (two) times daily as needed (itching from rash). 02/23/23   Creig Hines, MD    Physical Exam: Vitals:   05/11/23 0932  BP: (!) 124/53  Pulse: (!) 112  Resp: 16  Temp: 98.8 F (37.1 C)  TempSrc: Oral  SpO2: 98%    Vitals:   05/11/23 0932  BP: (!) 124/53  Pulse: (!) 112  Resp: 16  Temp: 98.8 F (37.1 C)  TempSrc: Oral  SpO2: 98%   General: Appears fatigued HEENT: Normocephalic, atraumatic Neck, supple, trachea midline, no tenderness Heart: Regular rate, irregular rhythm, no murmurs, no pedal edema Lungs: Clear bilaterally.  Normal work of breathing.  Room air Abdomen: Obese, soft, NT/ND, normal bowel sounds Extremities: Normal, atraumatic, no clubbing or cyanosis, normal muscle tone Skin: Erythematous rash on arms and legs Neurologic: Cranial nerves grossly intact, sensation intact, alert and oriented x3 Psychiatric: Normal affect   Labs on Admission: I have personally reviewed following labs and imaging studies  CBC: Recent Labs  Lab 05/11/23 1137  WBC 1.8*  HGB 7.2*  HCT 23.9*  MCV 91.6  PLT 119*   Basic Metabolic Panel: Recent Labs  Lab 05/11/23 1033  NA 137  K 3.6  CL 100  CO2 27  GLUCOSE 105*  BUN 12  CREATININE 0.82  CALCIUM 8.4*   GFR: Estimated Creatinine Clearance: 72.6 mL/min (by C-G formula based on SCr of 0.82 mg/dL). Liver Function Tests: Recent Labs  Lab 05/11/23 1033  AST 16  ALT 10  ALKPHOS 66  BILITOT 2.0*  PROT 6.1*  ALBUMIN 2.6*   No results for input(s): "LIPASE", "AMYLASE" in the last 168 hours. No results for  input(s): "AMMONIA" in the last 168 hours. Coagulation Profile: Recent Labs  Lab 05/11/23 1301  INR 1.5*   Cardiac Enzymes: No results for input(s): "CKTOTAL", "CKMB", "CKMBINDEX", "TROPONINI" in the last 168 hours. BNP (last 3 results) No results for input(s): "PROBNP" in the last 8760 hours. HbA1C: No results for input(s): "HGBA1C" in the last 72 hours. CBG: No results for input(s): "GLUCAP" in the last 168 hours. Lipid Profile: No results for input(s): "CHOL", "HDL", "LDLCALC", "TRIG", "CHOLHDL", "LDLDIRECT" in the last 72 hours. Thyroid Function Tests: No results for input(s): "TSH", "T4TOTAL", "FREET4", "T3FREE", "THYROIDAB" in the last 72 hours. Anemia Panel: No results for input(s): "VITAMINB12", "FOLATE", "FERRITIN", "TIBC", "IRON", "RETICCTPCT" in the last 72 hours. Urine analysis:    Component Value Date/Time   COLORURINE AMBER (A) 05/11/2023 1033   APPEARANCEUR HAZY (A) 05/11/2023 1033   APPEARANCEUR Clear 12/26/2011 2338   LABSPEC >1.046 (H) 05/11/2023 1033   LABSPEC 1.020 12/26/2011 2338   PHURINE 5.0 05/11/2023 1033   GLUCOSEU NEGATIVE 05/11/2023 1033   GLUCOSEU Negative 12/26/2011 2338   HGBUR LARGE (A) 05/11/2023 1033   BILIRUBINUR NEGATIVE 05/11/2023 1033   BILIRUBINUR Negative 12/26/2011 2338   KETONESUR 20 (A) 05/11/2023 1033   PROTEINUR 30 (A) 05/11/2023 1033   NITRITE NEGATIVE 05/11/2023 1033   LEUKOCYTESUR NEGATIVE 05/11/2023 1033   LEUKOCYTESUR Negative 12/26/2011 2338    Radiological Exams on Admission: DG Chest 2 View Result Date: 05/11/2023 CLINICAL DATA:  Low-grade fever. EXAM: CHEST - 2 VIEW COMPARISON:  01/31/2023 FINDINGS: The cardio pericardial silhouette is enlarged. left-sided permanent pacemaker again noted. Similar appearance of streaky basilar densities suggesting atelectasis. Right Port-A-Cath again noted. Telemetry leads overlie the chest. IMPRESSION: Similar appearance of streaky basilar densities suggesting atelectasis.  Electronically Signed   By: Kennith Center M.D.   On: 05/11/2023 13:05   CT Soft Tissue Neck W Contrast Result Date: 05/11/2023 CLINICAL DATA:  Hoarse voice, difficulty swallowing, history of breast cancer EXAM: CT NECK WITH CONTRAST TECHNIQUE: Multidetector CT imaging of the neck was performed using the standard protocol following the bolus administration of intravenous contrast. RADIATION DOSE REDUCTION: This exam was performed according to the departmental dose-optimization program which includes automated exposure control, adjustment of the mA and/or kV according to patient size and/or use  of iterative reconstruction technique. CONTRAST:  75mL OMNIPAQUE IOHEXOL 300 MG/ML  SOLN COMPARISON:  CT head and cervical spine 07/02/2021 FINDINGS: Pharynx and larynx: Normal. No mass or swelling. Salivary glands: No inflammation, mass, or stone. Thyroid: Normal. Lymph nodes: None enlarged or abnormal density. Vascular: Aortic atherosclerosis. The major arteries and veins of the neck patent. Right chest port with catheter in the right IJ. Limited intracranial: Negative. Visualized orbits: Negative. Mastoids and visualized paranasal sinuses: Mucosal thickening in the ethmoid air cells. Otherwise clear paranasal sinuses. The mastoids are well aerated. Skeleton: 6 mm lucent lesion in the left frontal bone (series 2, image 3), which appears unchanged from the 07/02/2021 CT head. Degenerative changes in the cervical spine. No acute osseous abnormality. Upper chest: No focal pulmonary opacity or pleural effusion. IMPRESSION: 1. No acute process in the neck. 2. 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 3. Aortic atherosclerosis. Aortic Atherosclerosis (ICD10-I70.0). Electronically Signed   By: Wiliam Ke M.D.   On: 05/11/2023 12:51    EKG: Independently reviewed.  Sinus rhythm  Assessment/Plan Principal Problem:   Shortness of  breath  Progressive fatigue Weakness Shortness of breath Poor p.o. intake Decreased ability to ambulate The symptoms are likely in the setting of chemotherapy Patient states that lack of appetite and dysgeusia have been driving some of her dehydration and poor oral intake.  Unsure these are contributing to current presentation. Plan: Place in observation IV fluids RD consult Protein supplement shakes PT and OT evaluations  Cough Shortness of breath Sore throat Chest x-ray with streaky infiltrate on bilateral bases.  This is not redemonstrated on chest CT.  At this time given immunosuppressed state we are unable to totally exclude infectious etiology.  COVID, flu, RSV negative Plan: Empiric Unasyn Check RVP Monitor vitals and fever curve IV fluids as above  Breast cancer on active chemotherapy Pancytopenia Laboratory derangements likely secondary to chemotherapy.  Patient with leukopenia making detection of infectious etiology more difficult.  Also anemia down to 7.2.  Paroxysmal atrial fibrillation Rate controlled Beta-blocker and Eliquis  Suspected malnutrition As evidenced by hypoalbuminemia RD consult  Obesity Complicating factor in overall care  Bilateral buttock ulcers Likely secondary to poor mobility.  WOC consult    DVT prophylaxis: SQ Lovenox Code Status: Full Family Communication: None Disposition Plan: Return to home via SNF Consults called: None Admission status: Observation, MedSurg   Tresa Moore MD [facility]   If 7PM-7AM, please contact night-coverage   05/11/2023, 4:12 PM

## 2023-05-11 NOTE — ED Triage Notes (Signed)
Presents with sore throat and some diff  swallowing  States this started couple of days ago   States she is having some discomfort  Hx of cancer

## 2023-05-12 ENCOUNTER — Encounter: Payer: Self-pay | Admitting: Internal Medicine

## 2023-05-12 DIAGNOSIS — R0602 Shortness of breath: Secondary | ICD-10-CM | POA: Diagnosis not present

## 2023-05-12 LAB — RESPIRATORY PANEL BY PCR

## 2023-05-12 LAB — BASIC METABOLIC PANEL
Anion gap: 8 (ref 5–15)
BUN: 8 mg/dL (ref 8–23)
CO2: 29 mmol/L (ref 22–32)
Calcium: 8.2 mg/dL — ABNORMAL LOW (ref 8.9–10.3)
Chloride: 101 mmol/L (ref 98–111)
Creatinine, Ser: 0.88 mg/dL (ref 0.44–1.00)
GFR, Estimated: >60 mL/min (ref >60)
Glucose, Bld: 130 mg/dL — ABNORMAL HIGH (ref 70–99)
Potassium: 3.4 mmol/L — ABNORMAL LOW (ref 3.5–5.1)
Sodium: 138 mmol/L (ref 135–145)

## 2023-05-12 LAB — CBC
HCT: 23.8 % — ABNORMAL LOW (ref 36.0–46.0)
Hemoglobin: 7.5 g/dL — ABNORMAL LOW (ref 12.0–15.0)
MCH: 27.7 pg (ref 26.0–34.0)
MCHC: 31.5 g/dL (ref 30.0–36.0)
MCV: 87.8 fL (ref 80.0–100.0)
Platelets: 128 10*3/uL — ABNORMAL LOW (ref 150–400)
RBC: 2.71 MIL/uL — ABNORMAL LOW (ref 3.87–5.11)
RDW: 18.9 % — ABNORMAL HIGH (ref 11.5–15.5)
WBC: 2.7 10*3/uL — ABNORMAL LOW (ref 4.0–10.5)
nRBC: 4.9 % — ABNORMAL HIGH (ref 0.0–0.2)

## 2023-05-12 LAB — BPAM RBC
Blood Product Expiration Date: 202501152359
ISSUE DATE / TIME: 202412201705
Unit Type and Rh: 6200

## 2023-05-12 LAB — TYPE AND SCREEN
ABO/RH(D): A POS
Antibody Screen: NEGATIVE
Unit division: 0

## 2023-05-12 LAB — GLUCOSE, CAPILLARY: Glucose-Capillary: 179 mg/dL — ABNORMAL HIGH (ref 70–99)

## 2023-05-12 LAB — CBG MONITORING, ED: Glucose-Capillary: 124 mg/dL — ABNORMAL HIGH (ref 70–99)

## 2023-05-12 MED ORDER — MEDIHONEY WOUND/BURN DRESSING EX PSTE: 1.0000 | PASTE | Freq: Every day | CUTANEOUS | Status: DC

## 2023-05-12 MED ORDER — MAGIC MOUTHWASH W/LIDOCAINE
15.0000 mL | Freq: Four times a day (QID) | ORAL | Status: DC | PRN
  Administered 2023-05-12: 15 mL via ORAL
  Filled 2023-05-12: qty 15

## 2023-05-12 MED ORDER — NYSTATIN 100000 UNIT/ML MT SUSP
5.0000 mL | Freq: Four times a day (QID) | OROMUCOSAL | Status: DC
  Administered 2023-05-12 – 2023-05-13 (×4): 500000 [IU] via ORAL
  Filled 2023-05-12 (×4): qty 5

## 2023-05-12 MED ORDER — COLLAGENASE 250 UNIT/GM EX OINT
TOPICAL_OINTMENT | Freq: Every day | CUTANEOUS | Status: DC
  Filled 2023-05-12: qty 30

## 2023-05-12 NOTE — TOC Initial Note (Signed)
Transition of Care [facility]) - Initial/Assessment Note    Patient Details  Name: Jennifer Gregory MRN: [id number] Date of Birth: 1946-01-26  [facility]) CM/SW Contact:    Rodney Langton, RN Phone Number: 05/12/2023, 3:14 PM  Clinical Narrative:                  Spoke with patient, admitted from home where she lives with son, who will provide transportation home when medically ready.  Dr. Hezzie Bump is confirmed her PCP, obtains medications from [facility].  Has rollator and shower chair at home.  Advised of recommendations for HHPT/OT, she does not immediately agree, would like to think about it before saying yes.  TOC will continue to follow.   Expected Discharge Plan: Home w Home Health Services Barriers to Discharge: Continued Medical Work up   Patient Goals and CMS Choice Patient states their goals for this hospitalization and ongoing recovery are:: Home with home health   Choice offered to / list presented to : Patient      Expected Discharge Plan and Services     Post Acute Care Choice: Home Health Living arrangements for the past 2 months: Single Family Home                                      Prior Living Arrangements/Services Living arrangements for the past 2 months: Single Family Home Lives with:: Adult Children Patient language and need for interpreter reviewed:: Yes        Need for Family Participation in Patient Care: Yes (Comment) Care giver support system in place?: Yes (comment) Current home services: DME Criminal Activity/Legal Involvement Pertinent to Current Situation/Hospitalization: No - Comment as needed  Activities of Daily Living   ADL Screening (condition at time of admission) Independently performs ADLs?: No Does the patient have a NEW difficulty with bathing/dressing/toileting/self-feeding that is expected to last >3 days?: Yes (Initiates electronic notice to provider for possible OT consult) Does the patient have a  NEW difficulty with getting in/out of bed, walking, or climbing stairs that is expected to last >3 days?: Yes (Initiates electronic notice to provider for possible PT consult) Does the patient have a NEW difficulty with communication that is expected to last >3 days?: No Is the patient deaf or have difficulty hearing?: No Does the patient have difficulty seeing, even when wearing glasses/contacts?: No Does the patient have difficulty concentrating, remembering, or making decisions?: No  Permission Sought/Granted   Permission granted to share information with : Yes, Verbal Permission Granted              Emotional Assessment   Attitude/Demeanor/Rapport: Engaged Affect (typically observed): Calm Orientation: : Oriented to Self, Oriented to Place, Oriented to  Time, Oriented to Situation   Psych Involvement: No (comment)  Admission diagnosis:  Shortness of breath [R06.02] Aspiration pneumonia of both lower lobes, unspecified aspiration pneumonia type (HCC) [J69.0] Pressure injury of skin of buttock, unspecified injury stage, unspecified laterality [L89.309] Sepsis, due to unspecified organism, unspecified whether acute organ dysfunction present [place]) [A41.9] Patient Active Problem List   Diagnosis Date Noted   Shortness of breath 05/11/2023   Chemotherapy induced neutropenia (HCC) 03/30/2023   Genetic testing 01/29/2023   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 [place] [address] Phone: [phone] Fax: [phone]     Social Drivers of Health (SDOH) Social History: SDOH Screenings   Food Insecurity: No Food Insecurity (05/12/2023)  Housing: Unknown (05/12/2023)  Transportation Needs: No Transportation Needs (05/12/2023)  Utilities: Not At Risk (05/12/2023)  Depression (PHQ2-9): Low Risk  (01/19/2023)  Financial Resource Strain: Low Risk  (09/06/2022)   Received from [facility]  Tobacco Use: Low Risk  (05/12/2023)   SDOH Interventions:     Readmission Risk Interventions     No data to display

## 2023-05-12 NOTE — ED Notes (Signed)
 Victorino Dike RN aware of assigned bed

## 2023-05-12 NOTE — Consult Note (Signed)
WOC Nurse Consult Note: Reason for Consult: buttocks and right elbow  Wound type: (4) Stage 3 Pressure Injuries, bilateral buttocks 2 per buttock  Right elbow; full thickness wound Pressure Injury POA: Yes Measurement: see nursing flow sheets Wound bed: Buttock wounds x 2 right buttock; clean, pink, moist Buttock wounds x 2 left buttock; clean, pink, moist  Right elbow; full thickness; 90% non viable tissue  Drainage (amount, consistency, odor) see nursing flow sheets Periwound: intact  Dressing procedure/placement/frequency: Cleanse buttock wounds with saline, pat dry Cut to fit silver hydrofiber and place over wounds, top with dry dressing. Change daily Low air loss mattress for moisture management and pressure redistribution Enzymatic debridement to the right elbow wound daily, top with dry dressing. Change daily Consult RD for wound healing Chair pressure redistribution pad for when sitting up in the chair.    Re consult if needed, will not follow at this time. Thanks  Eward Rutigliano [employer], RN,CWOCN, CNS, CWON-AP [phone])

## 2023-05-12 NOTE — Evaluation (Signed)
Occupational Therapy Evaluation Patient Details Name: Jennifer Gregory MRN: [id number] DOB: 08/30/1945 Today's Date: 05/12/2023   History of Present Illness Jennifer Gregory This is a pleasant 77 year old female history of stage II breast cancer on active chemotherapy as well as atrial fibrillation.  Heart failure documented in her documentation but last echocardiogram EF 50 to 55% with normal right ventricular systolic function.     Patient is actively receiving chemotherapy for breast cancer.  Stated her last treatment was Friday the week prior to admission.  Subsequently she has been appearing extremely weak and fatigued exacerbated by poor p.o. intake, hoarse voice, sore throat, mild cough.  Also with low-grade temperatures Tmax 99.8.     On my evaluation patient is resting in bed.  She is visibly fatigued but otherwise stable.  No specific pain complaints.  Vital signs overall stable.  Laboratory investigation significant for pancytopenia.  Metabolic profile overall reassuring.  CT neck reassuring.  Discussed case with EDP.   Clinical Impression   Patient received for OT evaluation. See flowsheet below for details of function. Generally, patient requiring SBA-MIN A for bed mobility, SBA with RW for functional mobility, and MIN A-SBA for ADLs. Patient demonstrating deficits in activity tolerance during mobility/ADLs.  Patient will benefit from continued OT while in acute care.        If plan is discharge home, recommend the following: A little help with walking and/or transfers;A little help with bathing/dressing/bathroom;Help with stairs or ramp for entrance;Assist for transportation;Assistance with cooking/housework    Functional Status Assessment  Patient has had a recent decline in their functional status and demonstrates the ability to make significant improvements in function in a reasonable and predictable amount of time.  Equipment Recommendations  None recommended by OT     Recommendations for Other Services       Precautions / Restrictions Precautions Precautions: Fall;Other (comment) (isolation) Precaution Comments: droplet precautions Restrictions Weight Bearing Restrictions Per Provider Order: No      Mobility Bed Mobility Overal bed mobility: Needs Assistance Bed Mobility: Supine to Sit, Sit to Supine     Supine to sit: HOB elevated, Supervision, Used rails Sit to supine: Min assist   General bed mobility comments: Pt states that sometimes at home she needs assistance to get LE back into bed.    Transfers Overall transfer level: Needs assistance Equipment used: Rolling walker (2 wheels) Transfers: Sit to/from Stand Sit to Stand: Contact guard assist           General transfer comment: Pt preferring to have BIL hands on RW (is a rollator user); not appearing open to education.      Balance Overall balance assessment: Mild deficits observed, not formally tested                                         ADL either performed or assessed with clinical judgement   ADL Overall ADL's : Needs assistance/impaired     Grooming: Wash/dry hands;Supervision/safety;Standing Grooming Details (indicate cue type and reason): standing; pt had abandoned RW behind her instead of pulling up to the sink; ignored OT instructions; pt is used to using a rollator, so likely these AD use habits are from that.                 Toilet Transfer: Set up;Supervision/safety;Regular Toilet;Grab bars;Rolling walker (2 wheels) Toilet Transfer Details (indicate cue type and reason): using  RW and grab bar for safe transfers Toileting- Clothing Manipulation and Hygiene: Supervision/safety;Sit to/from stand       Functional mobility during ADLs: Supervision/safety;Rolling walker (2 wheels) (attempting to cue pt with RW; she was appearing to choose to not follow instructions) General ADL Comments: Pt with significant activity tolerance  deficits; limited standing tolerance.     Vision Baseline Vision/History: 1 Wears glasses Patient Visual Report: No change from baseline       Perception         Praxis         Pertinent Vitals/Pain Pain Assessment Pain Assessment: No/denies pain (endorses generalized malaise, but no specific pain)     Extremity/Trunk Assessment Upper Extremity Assessment Upper Extremity Assessment: RUE deficits/detail;LUE deficits/detail;Right hand dominant RUE Sensation: history of peripheral neuropathy LUE Sensation: history of peripheral neuropathy   Lower Extremity Assessment Lower Extremity Assessment: Defer to PT evaluation;Generalized weakness       Communication Communication Communication: No apparent difficulties   Cognition Arousal: Alert Behavior During Therapy: WFL for tasks assessed/performed (appears irritated, but family reports this is baseline. Is cooperative with most OT requests, although refusing to use the RW as OT instructed (will sometimes push it to the side and only use it with one hand)) Overall Cognitive Status:  (Not explicitly tested, but appears oriented x4.)                                 General Comments: Daughter and granddaughter briefly in the room, but then pt requesting privacy on the way to the bathroom, so they left.     General Comments  On room air; O2 96% after bathroom activities once seated on EOB. HR 131 just after activity.    Exercises     Shoulder Instructions      Home Living Family/patient expects to be discharged to:: Private residence Living Arrangements: Children Available Help at Discharge: Family Type of Home: House Home Access: Stairs to enter;Ramped entrance Entrance Stairs-Number of Steps: 2   Home Layout: One level     Bathroom Shower/Tub: Producer, television/film/video: Standard     Home Equipment: Rollator (4 wheels);Cane - single point;Shower seat;Grab bars - tub/shower   Additional  Comments: son lives with her; he works during the day; also has a daughter and granddaughter not too far away.      Prior Functioning/Environment Prior Level of Function : Independent/Modified Independent             Mobility Comments: Uses SPC in the community, rollator in the home; denies recent falls. Has been weak the past few days, but prior to that was doing well. ADLs Comments: Typically (I) ADLs, drives, does housework/cooking. Recently has had trouble swallowing, so decreased PO intake and son has been bringing meals home on his way home from work.        OT Problem List: Decreased strength;Decreased activity tolerance;Impaired balance (sitting and/or standing);Decreased knowledge of use of DME or AE      OT Treatment/Interventions: Self-care/ADL training;Therapeutic exercise;Therapeutic activities;Patient/family education    OT Goals(Current goals can be found in the care plan section) Acute Rehab OT Goals Patient Stated Goal: Get better OT Goal Formulation: With patient Time For Goal Achievement: 05/26/23 Potential to Achieve Goals: Good ADL Goals Pt Will Perform Grooming: with modified independence;standing Pt Will Perform Lower Body Dressing: with modified independence;sit to/from stand  OT Frequency: Min 1X/week  Co-evaluation              AM-PAC OT "6 Clicks" Daily Activity     Outcome Measure Help from another person eating meals?: None Help from another person taking care of personal grooming?: A Little Help from another person toileting, which includes using toliet, bedpan, or urinal?: A Little Help from another person bathing (including washing, rinsing, drying)?: A Little Help from another person to put on and taking off regular upper body clothing?: None Help from another person to put on and taking off regular lower body clothing?: None 6 Click Score: 21   End of Session Equipment Utilized During Treatment: Rolling walker (2 wheels) Nurse  Communication: Mobility status  Activity Tolerance: Patient limited by fatigue Patient left: in bed;with call bell/phone within reach;with bed alarm set  OT Visit Diagnosis: Muscle weakness (generalized) (M62.81)                Time: 2130-8657 OT Time Calculation (min): 22 min Charges:  OT General Charges $OT Visit: 1 Visit OT Evaluation $OT Eval Moderate Complexity: 1 Mod OT Treatments $Self Care/Home Management : 8-22 mins  Linward Foster, MS, OTR/L  Alvester Morin 05/12/2023, 12:03 PM

## 2023-05-12 NOTE — Plan of Care (Signed)
  Problem: Health Behavior/Discharge Planning: Goal: Ability to manage health-related needs will improve Outcome: Progressing   Problem: Clinical Measurements: Goal: Ability to maintain clinical measurements within normal limits will improve Outcome: Progressing   Problem: Clinical Measurements: Goal: Ability to maintain clinical measurements within normal limits will improve Outcome: Progressing Goal: Will remain free from infection Outcome: Progressing Goal: Diagnostic test results will improve Outcome: Progressing Goal: Respiratory complications will improve Outcome: Progressing Goal: Cardiovascular complication will be avoided Outcome: Progressing   Problem: Activity: Goal: Risk for activity intolerance will decrease Outcome: Progressing   Problem: Nutrition: Goal: Adequate nutrition will be maintained Outcome: Progressing

## 2023-05-12 NOTE — Progress Notes (Signed)
PROGRESS NOTE    Jennifer Gregory  XBJ:[id number] DOB: 08-Jun-1945 DOA: 05/11/2023 PCP: Rayetta Humphrey, MD    Brief Narrative:  77 year old female history of stage II breast cancer on active chemotherapy as well as atrial fibrillation.  Heart failure documented in her documentation but last echocardiogram EF 50 to 55% with normal right ventricular systolic function.   Patient is actively receiving chemotherapy for breast cancer.  Stated her last treatment was Friday the week prior to admission.  Subsequently she has been appearing extremely weak and fatigued exacerbated by poor p.o. intake, hoarse voice, sore throat, mild cough.  Also with low-grade temperatures Tmax 99.8.   On my evaluation patient is resting in bed.  She is visibly fatigued but otherwise stable.  No specific pain complaints.  Vital signs overall stable.  Laboratory investigation significant for pancytopenia.  Metabolic profile overall reassuring.  CT neck reassuring.  CT chest without contrast negative for acute infectious process.  Procalcitonin negative.   Assessment & Plan:   Principal Problem:   Shortness of breath  Progressive fatigue Weakness Shortness of breath Poor p.o. intake Decreased ability to ambulate The symptoms are likely in the setting of chemotherapy Patient states that lack of appetite and dysgeusia have been driving some of her dehydration and poor oral intake.  Unsure these are contributing to current presentation. Plan: Continue IV fluids RD consultation Protein supplement shakes PT and OT evaluations May benefit from skilled nursing versus home health   Cough Shortness of breath Sore throat Chest x-ray with streaky infiltrate on bilateral bases.  This is not redemonstrated on chest CT. COVID, flu, RSV negative.  Respiratory viral panel pending.  Procalcitonin negative.  Relatively low suspicion for bacterial process at this point. Plan: Discontinue Unasyn Monitor for next 24 hours off  antibiotics Follow-up respiratory viral panel, pending Vitals and fever curve IVF as above Empiric nystatin swish and swallow 4 times daily Magic mouthwash with lidocaine 4 times daily as needed   Breast cancer on active chemotherapy Pancytopenia Laboratory derangements likely secondary to chemotherapy.  Patient received 1 unit PRBC for hemoglobin 7.2.  Responded only to 7.5.  No evidence of bleeding and patient on room air as well as hemodynamically stable.  Low suspicion for acute bleed.   Paroxysmal atrial fibrillation Rate controlled Beta-blocker and Eliquis   Suspected malnutrition As evidenced by hypoalbuminemia RD consult   Obesity Complicating factor in overall care   Bilateral buttock ulcers Likely secondary to poor mobility.  WOC consult   DVT prophylaxis: SQ Lovenox Code Status: Full Family Communication: None today Disposition Plan: Status is: Observation The patient will require care spanning > 2 midnights and should be moved to inpatient because: Progressive weakness and fatigue precluding a safe discharge at this time   Level of care: Med-Surg  Consultants:  None  Procedures:  None  Antimicrobials:   Subjective: Seen and examined.  Remains in ED time of my evaluation.  Reports persistent fatigue but improved.  Objective: Vitals:   05/12/23 0630 05/12/23 0920 05/12/23 1000 05/12/23 1022  BP: 97/62  (!) 117/54 97/62  Pulse: 94  89 90  Resp: (!) 24  15 20   Temp:  97.9 F (36.6 C)  98.4 F (36.9 C)  TempSrc:  Oral    SpO2: 96%  96% 99%    Intake/Output Summary (Last 24 hours) at 05/12/2023 1205 Last data filed at 05/12/2023 0530 Gross per 24 hour  Intake 300 ml  Output --  Net 300 ml   There  were no vitals filed for this visit.  Examination:  General exam: NAD.  Appears fatigued Respiratory system: Bibasilar crackles.  Normal work breathing.  Room air Cardiovascular system: S1-S2, regular rate, irregular rhythm, no murmurs, no pedal  edema Gastrointestinal system: Obese, soft, NT/ND, normal bowel sounds Central nervous system: Alert and oriented. No focal neurological deficits. Extremities: Symmetrically decreased power bilaterally.  Gait not assessed Skin: Erythematous rash upper and lower extremities bilaterally Psychiatry: Judgement and insight appear normal. Mood & affect appropriate.     Data Reviewed: I have personally reviewed following labs and imaging studies  CBC: Recent Labs  Lab 05/11/23 1137 05/12/23 0250  WBC 1.8* 2.7*  HGB 7.2* 7.5*  HCT 23.9* 23.8*  MCV 91.6 87.8  PLT 119* 128*   Basic Metabolic Panel: Recent Labs  Lab 05/11/23 1033 05/12/23 0250  NA 137 138  K 3.6 3.4*  CL 100 101  CO2 27 29  GLUCOSE 105* 130*  BUN 12 8  CREATININE 0.82 0.88  CALCIUM 8.4* 8.2*   GFR: Estimated Creatinine Clearance: 67.6 mL/min (by C-G formula based on SCr of 0.88 mg/dL). Liver Function Tests: Recent Labs  Lab 05/11/23 1033  AST 16  ALT 10  ALKPHOS 66  BILITOT 2.0*  PROT 6.1*  ALBUMIN 2.6*   No results for input(s): "LIPASE", "AMYLASE" in the last 168 hours. No results for input(s): "AMMONIA" in the last 168 hours. Coagulation Profile: Recent Labs  Lab 05/11/23 1301  INR 1.5*   Cardiac Enzymes: No results for input(s): "CKTOTAL", "CKMB", "CKMBINDEX", "TROPONINI" in the last 168 hours. BNP (last 3 results) No results for input(s): "PROBNP" in the last 8760 hours. HbA1C: No results for input(s): "HGBA1C" in the last 72 hours. CBG: Recent Labs  Lab 05/12/23 0758  GLUCAP 124*   Lipid Profile: No results for input(s): "CHOL", "HDL", "LDLCALC", "TRIG", "CHOLHDL", "LDLDIRECT" in the last 72 hours. Thyroid Function Tests: No results for input(s): "TSH", "T4TOTAL", "FREET4", "T3FREE", "THYROIDAB" in the last 72 hours. Anemia Panel: No results for input(s): "VITAMINB12", "FOLATE", "FERRITIN", "TIBC", "IRON", "RETICCTPCT" in the last 72 hours. Sepsis Labs: Recent Labs  Lab  05/11/23 1033 05/11/23 1301  PROCALCITON <0.10  --   LATICACIDVEN 1.4 1.2    Recent Results (from the past 240 hours)  Blood Culture (routine x 2)     Status: None (Preliminary result)   Collection Time: 05/11/23 10:33 AM   Specimen: BLOOD  Result Value Ref Range Status   Specimen Description BLOOD BLOOD LEFT FOREARM  Final   Special Requests   Final    BOTTLES DRAWN AEROBIC AND ANAEROBIC Blood Culture results may not be optimal due to an inadequate volume of blood received in culture bottles   Culture   Final    NO GROWTH < 24 HOURS Performed at [facility], [address] [place], [place] [address]    Report Status PENDING  Incomplete  Resp panel by RT-PCR (RSV, Flu A&B, Covid) Anterior Nasal Swab     Status: None   Collection Time: 05/11/23 10:33 AM   Specimen: Anterior Nasal Swab  Result Value Ref Range Status   SARS Coronavirus 2 by RT PCR NEGATIVE NEGATIVE Final    Comment: (NOTE) SARS-CoV-2 target nucleic acids are NOT DETECTED.  The SARS-CoV-2 RNA is generally detectable in upper respiratory specimens during the acute phase of infection. The lowest concentration of SARS-CoV-2 viral copies this assay can detect is 138 copies/mL. A negative result does not preclude SARS-Cov-2 infection and should not be  used as the sole basis for treatment or other patient management decisions. A negative result may occur with  improper specimen collection/handling, submission of specimen other than nasopharyngeal swab, presence of viral mutation(s) within the areas targeted by this assay, and inadequate number of viral copies(<138 copies/mL). A negative result must be combined with clinical observations, patient history, and epidemiological information. The expected result is Negative.  Fact Sheet for Patients:  BloggerCourse.com  Fact Sheet for Healthcare Providers:  SeriousBroker.it  This test is no t yet approved or  cleared by the [place] FDA and  has been authorized for detection and/or diagnosis of SARS-CoV-2 by FDA under an Emergency Use Authorization (EUA). This EUA will remain  in effect (meaning this test can be used) for the duration of the COVID-19 declaration under Section 564(b)(1) of the Act, 21 U.S.C.section 360bbb-3(b)(1), unless the authorization is terminated  or revoked sooner.       Influenza A by PCR NEGATIVE NEGATIVE Final   Influenza B by PCR NEGATIVE NEGATIVE Final    Comment: (NOTE) The Xpert Xpress SARS-CoV-2/FLU/RSV plus assay is intended as an aid in the diagnosis of influenza from Nasopharyngeal swab specimens and should not be used as a sole basis for treatment. Nasal washings and aspirates are unacceptable for Xpert Xpress SARS-CoV-2/FLU/RSV testing.  Fact Sheet for Patients: BloggerCourse.com  Fact Sheet for Healthcare Providers: SeriousBroker.it  This test is not yet approved or cleared by the [place] FDA and has been authorized for detection and/or diagnosis of SARS-CoV-2 by FDA under an Emergency Use Authorization (EUA). This EUA will remain in effect (meaning this test can be used) for the duration of the COVID-19 declaration under Section 564(b)(1) of the Act, 21 U.S.C. section 360bbb-3(b)(1), unless the authorization is terminated or revoked.     Resp Syncytial Virus by PCR NEGATIVE NEGATIVE Final    Comment: (NOTE) Fact Sheet for Patients: BloggerCourse.com  Fact Sheet for Healthcare Providers: SeriousBroker.it  This test is not yet approved or cleared by the [place] FDA and has been authorized for detection and/or diagnosis of SARS-CoV-2 by FDA under an Emergency Use Authorization (EUA). This EUA will remain in effect (meaning this test can be used) for the duration of the COVID-19 declaration under Section 564(b)(1) of the Act, 21  U.S.C. section 360bbb-3(b)(1), unless the authorization is terminated or revoked.  Performed at [facility], [address] [place], [place] [address]   Blood Culture (routine x 2)     Status: None (Preliminary result)   Collection Time: 05/11/23  1:01 PM   Specimen: BLOOD  Result Value Ref Range Status   Specimen Description BLOOD RIGHT ANTECUBITAL  Final   Special Requests   Final    BOTTLES DRAWN AEROBIC AND ANAEROBIC Blood Culture adequate volume   Culture   Final    NO GROWTH < 24 HOURS Performed at [facility], [address], [place], [place] [address]    Report Status PENDING  Incomplete         Radiology Studies: CT Chest Wo Contrast Result Date: 05/11/2023 CLINICAL DATA:  Aspiration. EXAM: CT CHEST WITHOUT CONTRAST TECHNIQUE: Multidetector CT imaging of the chest was performed following the standard protocol without IV contrast. RADIATION DOSE REDUCTION: This exam was performed according to the departmental dose-optimization program which includes automated exposure control, adjustment of the mA and/or kV according to patient size and/or use of iterative reconstruction technique. COMPARISON:  Chest radiograph dated 05/11/2023. FINDINGS: Evaluation of this exam is limited in  the absence of intravenous contrast. Cardiovascular: There is mild to moderate cardiomegaly. No pericardial effusion. Three vessel coronary vascular calcification. Left pectoral pacemaker device. Moderate atherosclerotic calcification of the thoracic aorta. No aneurysmal dilatation. 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 The central pulmonary arteries are grossly unremarkable. Mediastinum/Nodes: No hilar or mediastinal adenopathy. The esophagus is grossly unremarkable. No mediastinal fluid collection. Right-sided Port-A-Cath with tip in central SVC close to cavoatrial junction. Lungs/Pleura: Bibasilar linear atelectasis. No focal consolidation,  pleural effusion, or pneumothorax. The central airways are patent. Upper Abdomen: Cirrhosis. Musculoskeletal: Osteopenia with degenerative changes. No acute osseous pathology. IMPRESSION: 1. No acute intrathoracic pathology. 2. Mild to moderate cardiomegaly. 3. Cirrhosis. 4.  Aortic Atherosclerosis (ICD10-I70.0). Electronically Signed   By: Elgie Collard M.D.   On: 05/11/2023 16:06   DG Chest 2 View Result Date: 05/11/2023 CLINICAL DATA:  Low-grade fever. EXAM: CHEST - 2 VIEW COMPARISON:  01/31/2023 FINDINGS: The cardio pericardial silhouette is enlarged. left-sided permanent pacemaker again noted. Similar appearance of streaky basilar densities suggesting atelectasis. Right Port-A-Cath again noted. Telemetry leads overlie the chest. IMPRESSION: Similar appearance of streaky basilar densities suggesting atelectasis. Electronically Signed   By: Kennith Center M.D.   On: 05/11/2023 13:05   CT Soft Tissue Neck W Contrast Result Date: 05/11/2023 CLINICAL DATA:  Hoarse voice, difficulty swallowing, history of breast cancer EXAM: CT NECK WITH CONTRAST TECHNIQUE: Multidetector CT imaging of the neck was performed using the standard protocol following the bolus administration of intravenous contrast. RADIATION DOSE REDUCTION: This exam was performed according to the departmental dose-optimization program which includes automated exposure control, adjustment of the mA and/or kV according to patient size and/or use of iterative reconstruction technique. CONTRAST:  75mL OMNIPAQUE IOHEXOL 300 MG/ML  SOLN COMPARISON:  CT head and cervical spine 07/02/2021 FINDINGS: Pharynx and larynx: Normal. No mass or swelling. Salivary glands: No inflammation, mass, or stone. Thyroid: Normal. Lymph nodes: None enlarged or abnormal density. Vascular: Aortic atherosclerosis. The major arteries and veins of the neck patent. Right chest port with catheter in the right IJ. Limited intracranial: Negative. Visualized orbits: Negative.  Mastoids and visualized paranasal sinuses: Mucosal thickening in the ethmoid air cells. Otherwise clear paranasal sinuses. The mastoids are well aerated. Skeleton: 6 mm lucent lesion in the left frontal bone (series 2, image 3), which appears unchanged from the 07/02/2021 CT head. Degenerative changes in the cervical spine. No acute osseous abnormality. Upper chest: No focal pulmonary opacity or pleural effusion. IMPRESSION: 1. No acute process in the neck. 2. 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 3. Aortic atherosclerosis. Aortic Atherosclerosis (ICD10-I70.0). Electronically Signed   By: Wiliam Ke M.D.   On: 05/11/2023 12:51        Scheduled Meds:  citalopram  10 mg Oral Daily   feeding supplement  237 mL Oral BID BM   folic acid  1 mg Oral Daily   metoprolol succinate  12.5 mg Oral Daily   nystatin  5 mL Oral QID   rivaroxaban  20 mg Oral QPM   thiamine  100 mg Oral Daily   Continuous Infusions:  dextrose 5 % and 0.45 % NaCl with KCl 20 mEq/L 40 mL/hr at 05/11/23 2004     LOS: 0 days     Tresa Moore, MD [facility]   If 7PM-7AM, please contact night-coverage  05/12/2023, 12:05 PM

## 2023-05-12 NOTE — Evaluation (Signed)
Physical Therapy Evaluation Patient Details Name: Jennifer Gregory MRN: [id number] DOB: 04-18-1946 Today's Date: 05/12/2023  History of Present Illness  Pt is a pleasant 77 year old female history of stage II breast cancer on active chemotherapy as well as atrial fibrillation.  Heart failure documented in her documentation but last echocardiogram EF 50 to 55% with normal right ventricular systolic function.  Patient is actively receiving chemotherapy for breast cancer.  Stated her last treatment was Friday the week prior to admission.  Subsequently she has been appearing extremely weak and fatigued exacerbated by poor p.o. intake, hoarse voice, sore throat, mild cough.  MD assessment includes: progressive fatigue, weakness, SOB, poor PO intake with suspected malnutrition, decreased ability to ambulate, and bilateral buttock ulcers.   Clinical Impression  Pt was pleasant and motivated to participate during the session and put forth good effort throughout. Pt required extra time and effort and use of bed rail during sup to sit but no physical assistance but did require min A to manage her BLEs during sit to sup.  Pt was able to stand from various height surfaces including a standard height toilet without physical assist and although she was impulsive at times with the RW needing cues for safe sequencing she was able to amb with no overt LOB.  Pt will benefit from continued PT services upon discharge to safely address deficits listed in patient problem list for decreased caregiver assistance and eventual return to PLOF.          If plan is discharge home, recommend the following: A little help with walking and/or transfers;A little help with bathing/dressing/bathroom;Assistance with cooking/housework;Help with stairs or ramp for entrance;Assist for transportation   Can travel by private vehicle        Equipment Recommendations Rolling walker (2 wheels)  Recommendations for Other Services        Functional Status Assessment Patient has had a recent decline in their functional status and demonstrates the ability to make significant improvements in function in a reasonable and predictable amount of time.     Precautions / Restrictions Precautions Precautions: Fall Precaution Comments: droplet precautions Restrictions Weight Bearing Restrictions Per Provider Order: No      Mobility  Bed Mobility Overal bed mobility: Needs Assistance Bed Mobility: Supine to Sit, Sit to Supine     Supine to sit: Supervision Sit to supine: Min assist   General bed mobility comments: Min A for BLE management during sit to sup    Transfers Overall transfer level: Needs assistance Equipment used: Rolling walker (2 wheels) Transfers: Sit to/from Stand Sit to Stand: Contact guard assist           General transfer comment: Min verbal cues for hand placement with no physical assist needed to stand from various height surfaces including standard height toilet    Ambulation/Gait Ambulation/Gait assistance: Contact guard assist Gait Distance (Feet): 10 Feet x 2 Assistive device: Rolling walker (2 wheels) Gait Pattern/deviations: Step-through pattern, Decreased step length - right, Decreased step length - left Gait velocity: decreased     General Gait Details: Slow cadence and short B step length but steady with no overt LOB, impulsive with the RW with mod cuing for general safe sequencing required  Stairs            Wheelchair Mobility     Tilt Bed    Modified Rankin (Stroke Patients Only)       Balance Overall balance assessment: Needs assistance   Sitting balance-Leahy Scale: Good  Standing balance support: Bilateral upper extremity supported, During functional activity Standing balance-Leahy Scale: Good                               Pertinent Vitals/Pain Pain Assessment Pain Assessment: No/denies pain    Home Living Family/patient expects to  be discharged to:: Private residence Living Arrangements: Children Available Help at Discharge: Family;Available 24 hours/day Type of Home: House Home Access: Ramped entrance       Home Layout: One level Home Equipment: Rollator (4 wheels);Cane - single point;Shower seat;Grab bars - tub/shower Additional Comments: son lives with her and not currently working per patient report; also has a daughter and granddaughter not too far away that can assist as needed    Prior Function Prior Level of Function : Independent/Modified Independent             Mobility Comments: Uses SPC in the community, rollator in the home; denies recent falls. Has been weak the past few days, but prior to that was doing well. ADLs Comments: Ind with ADLs     Extremity/Trunk Assessment   Upper Extremity Assessment Upper Extremity Assessment: Defer to OT evaluation    Lower Extremity Assessment Lower Extremity Assessment: Generalized weakness       Communication   Communication Communication: No apparent difficulties Cueing Techniques: Verbal cues;Visual cues  Cognition Arousal: Alert Behavior During Therapy: WFL for tasks assessed/performed, Impulsive Overall Cognitive Status: No family/caregiver present to determine baseline cognitive functioning                                          General Comments      Exercises     Assessment/Plan    PT Assessment Patient needs continued PT services  PT Problem List Decreased strength;Decreased activity tolerance;Decreased balance;Decreased mobility;Decreased knowledge of use of DME;Decreased safety awareness       PT Treatment Interventions DME instruction;Gait training;Functional mobility training;Therapeutic activities;Therapeutic exercise;Balance training;Patient/family education    PT Goals (Current goals can be found in the Care Plan section)  Acute Rehab PT Goals Patient Stated Goal: To get stronger PT Goal Formulation:  With patient Time For Goal Achievement: 05/25/23 Potential to Achieve Goals: Good    Frequency Min 1X/week     Co-evaluation               AM-PAC PT "6 Clicks" Mobility  Outcome Measure Help needed turning from your back to your side while in a flat bed without using bedrails?: A Little Help needed moving from lying on your back to sitting on the side of a flat bed without using bedrails?: A Little Help needed moving to and from a bed to a chair (including a wheelchair)?: A Little Help needed standing up from a chair using your arms (e.g., wheelchair or bedside chair)?: A Little Help needed to walk in hospital room?: A Little Help needed climbing 3-5 steps with a railing? : A Lot 6 Click Score: 17    End of Session Equipment Utilized During Treatment: Gait belt Activity Tolerance: Patient tolerated treatment well Patient left: in bed;with call bell/phone within reach;with bed alarm set (pt declined up in chair) Nurse Communication: Mobility status PT Visit Diagnosis: Difficulty in walking, not elsewhere classified (R26.2);Muscle weakness (generalized) (M62.81)    Time: 4098-1191 PT Time Calculation (min) (ACUTE ONLY): 26 min  Charges:   PT Evaluation $PT Eval Moderate Complexity: 1 Mod PT Treatments $Gait Training: 8-22 mins PT General Charges $$ ACUTE PT VISIT: 1 Visit       D. Elly Modena PT, DPT 05/12/23, 4:05 PM

## 2023-05-12 NOTE — Care Management Obs Status (Signed)
MEDICARE OBSERVATION STATUS NOTIFICATION   Patient Details  Name: Jennifer Gregory MRN: [id number] Date of Birth: [birth year]   Medicare Observation Status Notification Given:       Rodney Langton, RN 05/12/2023, 3:13 PM

## 2023-05-13 DIAGNOSIS — R0602 Shortness of breath: Secondary | ICD-10-CM | POA: Diagnosis not present

## 2023-05-13 LAB — COMPREHENSIVE METABOLIC PANEL
ALT: 11 U/L (ref 0–44)
AST: 14 U/L — ABNORMAL LOW (ref 15–41)
Albumin: 2.3 g/dL — ABNORMAL LOW (ref 3.5–5.0)
Alkaline Phosphatase: 53 U/L (ref 38–126)
Anion gap: 8 (ref 5–15)
BUN: 10 mg/dL (ref 8–23)
CO2: 27 mmol/L (ref 22–32)
Calcium: 8.1 mg/dL — ABNORMAL LOW (ref 8.9–10.3)
Chloride: 102 mmol/L (ref 98–111)
Creatinine, Ser: 0.81 mg/dL (ref 0.44–1.00)
GFR, Estimated: >60 mL/min (ref >60)
Glucose, Bld: 110 mg/dL — ABNORMAL HIGH (ref 70–99)
Potassium: 3 mmol/L — ABNORMAL LOW (ref 3.5–5.1)
Sodium: 137 mmol/L (ref 135–145)
Total Bilirubin: 1.2 mg/dL — ABNORMAL HIGH (ref <1.2)
Total Protein: 5.6 g/dL — ABNORMAL LOW (ref 6.5–8.1)

## 2023-05-13 LAB — CBC
HCT: 24.1 % — ABNORMAL LOW (ref 36.0–46.0)
Hemoglobin: 7.5 g/dL — ABNORMAL LOW (ref 12.0–15.0)
MCH: 27.4 pg (ref 26.0–34.0)
MCHC: 31.1 g/dL (ref 30.0–36.0)
MCV: 88 fL (ref 80.0–100.0)
Platelets: 133 10*3/uL — ABNORMAL LOW (ref 150–400)
RBC: 2.74 MIL/uL — ABNORMAL LOW (ref 3.87–5.11)
RDW: 19.5 % — ABNORMAL HIGH (ref 11.5–15.5)
WBC: 4.4 10*3/uL (ref 4.0–10.5)
nRBC: 4.3 % — ABNORMAL HIGH (ref 0.0–0.2)

## 2023-05-13 LAB — GLUCOSE, CAPILLARY: Glucose-Capillary: 104 mg/dL — ABNORMAL HIGH (ref 70–99)

## 2023-05-13 MED ORDER — JUVEN PO PACK: 1.0000 | PACK | Freq: Two times a day (BID) | ORAL | 0 refills | Status: AC

## 2023-05-13 MED ORDER — NYSTATIN 100000 UNIT/ML MT SUSP: 5.0000 mL | Freq: Four times a day (QID) | OROMUCOSAL | 0 refills | Status: AC

## 2023-05-13 MED ORDER — POTASSIUM CHLORIDE CRYS ER 20 MEQ PO TBCR
40.0000 meq | EXTENDED_RELEASE_TABLET | ORAL | Status: DC
  Administered 2023-05-13: 40 meq via ORAL
  Filled 2023-05-13: qty 2

## 2023-05-13 MED ORDER — JUVEN PO PACK
1.0000 | PACK | Freq: Two times a day (BID) | ORAL | Status: DC
  Administered 2023-05-13: 1 via ORAL

## 2023-05-13 MED ORDER — ENSURE ENLIVE PO LIQD: 237.0000 mL | Freq: Two times a day (BID) | ORAL | Status: AC

## 2023-05-13 MED ORDER — COLLAGENASE 250 UNIT/GM EX OINT: TOPICAL_OINTMENT | Freq: Every day | CUTANEOUS | 0 refills | Status: AC

## 2023-05-13 MED ORDER — MAGIC MOUTHWASH W/LIDOCAINE: 15.0000 mL | Freq: Four times a day (QID) | ORAL | 1 refills | Status: AC | PRN

## 2023-05-13 NOTE — TOC Transition Note (Signed)
Transition of Care [facility]) - Discharge Note   Patient Details  Name: Jennifer Gregory MRN: [id number] Date of Birth: Nov 07, 1945  [facility] CM/SW Contact:  Hetty Ely, RN Phone Number: 05/13/2023, 12:36 PM   Clinical Narrative: CM spoke with patient and Son Jennifer Gregory about HHPT/OT recommendations, they both declined. 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 Patient also declined rolling walker, says she has rollator at home. No CM needs      Final next level of care: Home/Self Care Barriers to Discharge: Barriers Resolved   Patient Goals and CMS Choice Patient states their goals for this hospitalization and ongoing recovery are:: To return home   Choice offered to / list presented to : Patient      Discharge Placement                  Name of family member notified: Jennifer Gregory Patient and family notified of of transfer: 05/13/23  Discharge Plan and Services Additional resources added to the After Visit Summary for       Post Acute Care Choice: Home Health          DME Arranged: Other see Gregory (Patient declined, says she has rollator at home.) DME Agency: NA       HH Arranged: Patient Refused HH          Social Drivers of Health (SDOH) Interventions SDOH Screenings   Food Insecurity: No Food Insecurity (05/12/2023)  Housing: Unknown (05/12/2023)  Transportation Needs: No Transportation Needs (05/12/2023)  Utilities: Not At Risk (05/12/2023)  Depression (PHQ2-9): Low Risk  (01/19/2023)  Financial Resource Strain: Low Risk  (09/06/2022)   Received from [facility]  Tobacco Use: Low Risk  (05/12/2023)     Readmission Risk Interventions     No data to display

## 2023-05-13 NOTE — Progress Notes (Signed)
Initial Nutrition Assessment  DOCUMENTATION CODES:   Morbid obesity  INTERVENTION:  Continue with current diet Continue with Ensure Plus High Protein po BID, each supplement provides 350 kcal and 20 grams of protein. Initiate -1 packet Juven BID, each packet provides 95 calories, 2.5 grams of protein (collagen), and 9.8 grams of carbohydrate (3 grams sugar); also contains 7 grams of L-arginine and L-glutamine, 300 mg vitamin C, 15 mg vitamin E, 1.2 mcg vitamin B-12, 9.5 mg zinc, 200 mg calcium, and 1.5 g  Calcium Beta-hydroxy-Beta-methylbutyrate to support wound healing    NUTRITION DIAGNOSIS:   Increased nutrient needs related to cancer and cancer related treatments as evidenced by estimated needs.    GOAL:   Patient will meet greater than or equal to 90% of their needs    MONITOR:   PO intake, Supplement acceptance, Labs  REASON FOR ASSESSMENT:   Consult Assessment of nutrition requirement/status, Wound healing  ASSESSMENT: 77 y.o. F,  admitted with SOB. Pt is activiely receiving Chemo with last treatment Friday of prior week.  Reporting that she has been extremely weak and fatigued exacerbated by reported poor p.o. intake.  PMH; Stg II breast cancer on active chemotherapy, A-Fib, HTN, CHF, DMT2, GERD, Morbid obesity, CAD. Reached out to pt via phone with no answer. All information obtained through EMR.  and team.  Review of EMR revealed;  Declined oral intake, low grade fever, increased fatigue, hoarse voice, mild cough and sore throat. Per MD progress the pt stated that lack of appetite and dysgeusia have been driving some of her dehydration and poor oral intake. Drinks protein shakes at home.  RN reports that she ate all of her oatmeal and attempting to finish her omelet this morning.  Drinking hot tea.   Admit weight: 119.5 kg Weight history: 05/13/23 119.5 kg  05/04/23 121.3 kg  04/27/23 120.7 kg  04/13/23 121.5 kg  04/06/23 121.5 kg  03/30/23 118.3 kg  03/23/23  119.9 kg  03/16/23 118.8 kg  03/02/23 120.7 kg  02/23/23 122.7 kg      Average Meal Intake: No current documentation  Nutritionally Relevant Medications: Scheduled Meds:  feeding supplement  237 mL Oral BID BM   folic acid  1 mg Oral Daily   metoprolol succinate  12.5 mg Oral Daily   rivaroxaban  20 mg Oral QPM   thiamine  100 mg Oral Daily    Labs Reviewed  NUTRITION - FOCUSED PHYSICAL EXAM:  Deferred   Diet Order:   Diet Order             Diet regular Room service appropriate? Yes; Fluid consistency: Thin  Diet effective now                   EDUCATION NEEDS:   Not appropriate for education at this time  Skin:  Skin Assessment: Skin Integrity Issues: Skin Integrity Issues:: Stage III Stage III: Bilateral buttocks  Last BM:  PTA  Height:   Ht Readings from Last 1 Encounters:  02/20/23 5\' 4"  (1.626 m)    Weight:   Wt Readings from Last 1 Encounters:  05/13/23 119.5 kg    Ideal Body Weight:     BMI:  Body mass index is 45.22 kg/m.  Estimated Nutritional Needs:   Kcal:  1650-1950 kcal/d  Protein:  85-110 g/d  Fluid:  23ml/kcal    Jamelle Haring RDN, LDN Clinical Dietitian  Pleas see Amion for contact information

## 2023-05-13 NOTE — Progress Notes (Signed)
PHARMACY - PHYSICIAN COMMUNICATION CRITICAL VALUE ALERT - BLOOD CULTURE IDENTIFICATION (BCID)  Jennifer Gregory is an 77 y.o. female who presented to [facility] on 05/11/2023 with a chief complaint of SOB.   Assessment:  Gram positive rods in 1 of 4 bottles, aerobic.  Most likely a contaminant.  (include suspected source if known)  Name of physician (or Provider) Contacted: Manuela Schwartz, NP   Current antibiotics: none   Changes to prescribed antibiotics recommended:  Recommendations accepted by provider -  will wait until further blood cx to start abx.   No results found for this or any previous visit.  Charlette Hennings D 05/13/2023  1:16 AM

## 2023-05-13 NOTE — Discharge Summary (Signed)
Physician Discharge Summary   Patient: Jennifer Gregory MRN: [id number] DOB: April 15, 1946  Admit date:     05/11/2023  Discharge date: {dischdate:26783}  Discharge Physician: Pennie Banter   PCP: Rayetta Humphrey, MD   Recommendations at discharge:  {Tip this will not be part of the note when signed- Example include specific recommendations for outpatient follow-up, pending tests to follow-up on. (Optional):26781}  ***  Discharge Diagnoses: Principal Problem:   Shortness of breath  Resolved Problems:   * No resolved hospital problems. [facility] Course: No notes on file  Assessment and Plan: No notes have been filed under this hospital service. Service: Hospitalist     {Tip this will not be part of the note when signed Body mass index is 45.22 kg/m. ,  Nutrition Documentation    Flowsheet Row ED to Hosp-Admission (Current) from 05/11/2023 in [facility] 1C MEDICAL TELEMETRY  Nutrition Problem Increased nutrient needs  Etiology cancer and cancer related treatments  Nutrition Goal Patient will meet greater than or equal to 90% of their needs  Interventions Ensure Enlive (each supplement provides 350kcal and 20 grams of protein), MVI, Juven     ,  Active Pressure Injury/Wound(s)     Pressure Ulcer  Duration          Pressure Injury 08/08/19 Buttocks Mid Stage 2 -  Partial thickness loss of dermis presenting as a shallow open injury with a red, pink wound bed without slough. 1373 days           (Optional):26781}  {(NOTE) Pain control PDMP Statment (Optional):26782} Consultants: *** Procedures performed: ***  Disposition: {Plan; Disposition:26390} Diet recommendation:  Discharge Diet Orders (From admission, onward)     Start     Ordered   05/13/23 0000  Diet - low sodium heart healthy        05/13/23 1143           {Diet_Plan:26776} DISCHARGE MEDICATION: Allergies as of 05/13/2023       Reactions   Aspirin Other (See  Comments)   Bleeding risk since patient is on xarelta   Ciprofloxacin Other (See Comments)   Other Reaction: RASH ITCHING   Codeine Other (See Comments)   Diazepam Other (See Comments), Nausea And Vomiting   Dofetilide Other (See Comments)   Other Reaction: arrhythmia   Hydrocodone-acetaminophen Other (See Comments), Nausea And Vomiting   Morphine Nausea And Vomiting   Niacin Other (See Comments)   fatigue   Oxycodone-acetaminophen Other (See Comments), Nausea And Vomiting   Sotalol Other (See Comments)   Other Reaction: Prolonged QTc   Amiodarone Other (See Comments)   Affecting liver   Pyridoxine Other (See Comments)   Splitting headache   Sulfamethoxazole-trimethoprim Swelling   Other Rash   EKG sticker electrodes extended time        Medication List     PAUSE taking these medications    torsemide 20 MG tablet Wait to take this until: May 16, 2023 Recommend monitoring daily weights, resume torsemide if gaining weight or have increasing swelling. If BP is lower than 120 (top #) or 60 (bottom #), recommend skipping torsemide to prevent dropping BP too low. Commonly known as: DEMADEX Take 40 mg by mouth daily in the afternoon. 4 pm       TAKE these medications    CENTRUM SILVER 50+WOMEN PO Take 1 tablet by mouth daily.   citalopram 10 MG tablet Commonly known as: CELEXA Take 10 mg by mouth daily.  clobetasol cream 0.05 % Commonly known as: TEMOVATE Apply topically.   collagenase 250 UNIT/GM ointment Commonly known as: SANTYL Apply topically daily. Start taking on: May 14, 2023   dexamethasone 4 MG tablet Commonly known as: DECADRON Take 2 tablets daily for 2 days, start the day after chemotherapy. Take with food.   diphenoxylate-atropine 2.5-0.025 MG tablet Commonly known as: LOMOTIL Take 1 tablet by mouth 4 (four) times daily as needed for diarrhea or loose stools.   feeding supplement Liqd Take 237 mLs by mouth 2 (two) times daily  between meals.   nutrition supplement (JUVEN) Pack Take 1 packet by mouth 2 (two) times daily between meals.   lidocaine-prilocaine cream Commonly known as: EMLA Apply to affected area once   magic mouthwash w/lidocaine Soln Take 15 mLs by mouth 4 (four) times daily as needed for mouth pain. Suspension contains equal amounts of Maalox Extra Strength, nystatin, diphenhydramine and lidocaine.   metoprolol succinate 25 MG 24 hr tablet Commonly known as: TOPROL-XL Take 12.5 mg by mouth daily.   nitroGLYCERIN 0.4 MG SL tablet Commonly known as: NITROSTAT Place 0.4 mg under the tongue every 5 (five) minutes as needed for chest pain.   nystatin 100000 UNIT/ML suspension Commonly known as: MYCOSTATIN Take 5 mLs (500,000 Units total) by mouth 4 (four) times daily.   ondansetron 4 MG tablet Commonly known as: Zofran Take 1 tablet (4 mg total) by mouth daily as needed for nausea or vomiting. What changed: Another medication with the same name was removed. Continue taking this medication, and follow the directions you see here.   potassium chloride 10 MEQ tablet Commonly known as: KLOR-CON Take 30 mEq by mouth 2 (two) times daily. Am and 4 pm   prochlorperazine 10 MG tablet Commonly known as: COMPAZINE Take 1 tablet (10 mg total) by mouth every 6 (six) hours as needed for nausea or vomiting.   triamcinolone ointment 0.5 % Commonly known as: KENALOG Apply 1 Application topically 2 (two) times daily as needed (itching from rash).   Xarelto 20 MG Tabs tablet Generic drug: rivaroxaban Take 20 mg by mouth every evening.               Discharge Care Instructions  (From admission, onward)           Start     Ordered   05/13/23 0000  Discharge wound care:       Comments: Cleanse buttock wounds with saline, pat dry Apply silver hydrofiber to each wound, cut to fit (Aquacel Ag+) [place] [id number]. Top with foam Change daily  05/12/23 1810      05/13/23 0500    Wound care   Daily      Comments: Cleanse elbow wound daily with saline, pat dry Apply Santyl to wound bed,  top with dry dressing.   Change daily  05/12/23 1813   05/13/23 1143            Discharge Exam: Filed Weights   05/13/23 0609  Weight: 119.5 kg   ***  Condition at discharge: {DC Condition:26389}  The results of significant diagnostics from this hospitalization (including imaging, microbiology, ancillary and laboratory) are listed below for reference.   Imaging Studies: CT Chest Wo Contrast Result Date: 05/11/2023 CLINICAL DATA:  Aspiration. EXAM: CT CHEST WITHOUT CONTRAST TECHNIQUE: Multidetector CT imaging of the chest was performed following the standard protocol without IV contrast. RADIATION DOSE REDUCTION: This exam was performed according to the departmental dose-optimization program which includes automated exposure control,  adjustment of the mA and/or kV according to patient size and/or use of iterative reconstruction technique. COMPARISON:  Chest radiograph dated 05/11/2023. FINDINGS: Evaluation of this exam is limited in the absence of intravenous contrast. Cardiovascular: There is mild to moderate cardiomegaly. No pericardial effusion. Three vessel coronary vascular calcification. Left pectoral pacemaker device. Moderate atherosclerotic calcification of the thoracic aorta. No aneurysmal dilatation. 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 The central pulmonary arteries are grossly unremarkable. Mediastinum/Nodes: No hilar or mediastinal adenopathy. The esophagus is grossly unremarkable. No mediastinal fluid collection. Right-sided Port-A-Cath with tip in central SVC close to cavoatrial junction. Lungs/Pleura: Bibasilar linear atelectasis. No focal consolidation, pleural effusion, or pneumothorax. The central airways are patent. Upper Abdomen: Cirrhosis. Musculoskeletal: Osteopenia with degenerative changes. No acute osseous pathology.  IMPRESSION: 1. No acute intrathoracic pathology. 2. Mild to moderate cardiomegaly. 3. Cirrhosis. 4.  Aortic Atherosclerosis (ICD10-I70.0). Electronically Signed   By: Elgie Collard M.D.   On: 05/11/2023 16:06   DG Chest 2 View Result Date: 05/11/2023 CLINICAL DATA:  Low-grade fever. EXAM: CHEST - 2 VIEW COMPARISON:  01/31/2023 FINDINGS: The cardio pericardial silhouette is enlarged. left-sided permanent pacemaker again noted. Similar appearance of streaky basilar densities suggesting atelectasis. Right Port-A-Cath again noted. Telemetry leads overlie the chest. IMPRESSION: Similar appearance of streaky basilar densities suggesting atelectasis. Electronically Signed   By: Kennith Center M.D.   On: 05/11/2023 13:05   CT Soft Tissue Neck W Contrast Result Date: 05/11/2023 CLINICAL DATA:  Hoarse voice, difficulty swallowing, history of breast cancer EXAM: CT NECK WITH CONTRAST TECHNIQUE: Multidetector CT imaging of the neck was performed using the standard protocol following the bolus administration of intravenous contrast. RADIATION DOSE REDUCTION: This exam was performed according to the departmental dose-optimization program which includes automated exposure control, adjustment of the mA and/or kV according to patient size and/or use of iterative reconstruction technique. CONTRAST:  75mL OMNIPAQUE IOHEXOL 300 MG/ML  SOLN COMPARISON:  CT head and cervical spine 07/02/2021 FINDINGS: Pharynx and larynx: Normal. No mass or swelling. Salivary glands: No inflammation, mass, or stone. Thyroid: Normal. Lymph nodes: None enlarged or abnormal density. Vascular: Aortic atherosclerosis. The major arteries and veins of the neck patent. Right chest port with catheter in the right IJ. Limited intracranial: Negative. Visualized orbits: Negative. Mastoids and visualized paranasal sinuses: Mucosal thickening in the ethmoid air cells. Otherwise clear paranasal sinuses. The mastoids are well aerated. Skeleton: 6 mm lucent  lesion in the left frontal bone (series 2, image 3), which appears unchanged from the 07/02/2021 CT head. Degenerative changes in the cervical spine. No acute osseous abnormality. Upper chest: No focal pulmonary opacity or pleural effusion. IMPRESSION: 1. No acute process in the neck. 2. 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 3. Aortic atherosclerosis. Aortic Atherosclerosis (ICD10-I70.0). Electronically Signed   By: Wiliam Ke M.D.   On: 05/11/2023 12:51    Microbiology: Results for orders placed or performed during the hospital encounter of 05/11/23  Blood Culture (routine x 2)     Status: None (Preliminary result)   Collection Time: 05/11/23 10:33 AM   Specimen: BLOOD  Result Value Ref Range Status   Specimen Description BLOOD BLOOD LEFT FOREARM  Final   Special Requests   Final    BOTTLES DRAWN AEROBIC AND ANAEROBIC Blood Culture results may not be optimal due to an inadequate volume of blood received in culture bottles   Culture  Final    NO GROWTH 2 DAYS Performed at [facility], [address] [place], [place] [address]    Report Status PENDING  Incomplete  Resp panel by RT-PCR (RSV, Flu A&B, Covid) Anterior Nasal Swab     Status: None   Collection Time: 05/11/23 10:33 AM   Specimen: Anterior Nasal Swab  Result Value Ref Range Status   SARS Coronavirus 2 by RT PCR NEGATIVE NEGATIVE Final    Comment: (NOTE) SARS-CoV-2 target nucleic acids are NOT DETECTED.  The SARS-CoV-2 RNA is generally detectable in upper respiratory specimens during the acute phase of infection. The lowest concentration of SARS-CoV-2 viral copies this assay can detect is 138 copies/mL. A negative result does not preclude SARS-Cov-2 infection and should not be used as the sole basis for treatment or other patient management decisions. A negative result may occur with  improper specimen  collection/handling, submission of specimen other than nasopharyngeal swab, presence of viral mutation(s) within the areas targeted by this assay, and inadequate number of viral copies(<138 copies/mL). A negative result must be combined with clinical observations, patient history, and epidemiological information. The expected result is Negative.  Fact Sheet for Patients:  BloggerCourse.com  Fact Sheet for Healthcare Providers:  SeriousBroker.it  This test is no t yet approved or cleared by the [place] FDA and  has been authorized for detection and/or diagnosis of SARS-CoV-2 by FDA under an Emergency Use Authorization (EUA). This EUA will remain  in effect (meaning this test can be used) for the duration of the COVID-19 declaration under Section 564(b)(1) of the Act, 21 U.S.C.section 360bbb-3(b)(1), unless the authorization is terminated  or revoked sooner.       Influenza A by PCR NEGATIVE NEGATIVE Final   Influenza B by PCR NEGATIVE NEGATIVE Final    Comment: (NOTE) The Xpert Xpress SARS-CoV-2/FLU/RSV plus assay is intended as an aid in the diagnosis of influenza from Nasopharyngeal swab specimens and should not be used as a sole basis for treatment. Nasal washings and aspirates are unacceptable for Xpert Xpress SARS-CoV-2/FLU/RSV testing.  Fact Sheet for Patients: BloggerCourse.com  Fact Sheet for Healthcare Providers: SeriousBroker.it  This test is not yet approved or cleared by the [place] FDA and has been authorized for detection and/or diagnosis of SARS-CoV-2 by FDA under an Emergency Use Authorization (EUA). This EUA will remain in effect (meaning this test can be used) for the duration of the COVID-19 declaration under Section 564(b)(1) of the Act, 21 U.S.C. section 360bbb-3(b)(1), unless the authorization is terminated or revoked.     Resp Syncytial  Virus by PCR NEGATIVE NEGATIVE Final    Comment: (NOTE) Fact Sheet for Patients: BloggerCourse.com  Fact Sheet for Healthcare Providers: SeriousBroker.it  This test is not yet approved or cleared by the [place] FDA and has been authorized for detection and/or diagnosis of SARS-CoV-2 by FDA under an Emergency Use Authorization (EUA). This EUA will remain in effect (meaning this test can be used) for the duration of the COVID-19 declaration under Section 564(b)(1) of the Act, 21 U.S.C. section 360bbb-3(b)(1), unless the authorization is terminated or revoked.  Performed at [facility], [address] [place], [place] [address]   Blood Culture (routine x 2)     Status: None (Preliminary result)   Collection Time: 05/11/23  1:01 PM   Specimen: BLOOD  Result Value Ref Range Status   Specimen Description   Final    BLOOD RIGHT ANTECUBITAL Performed at [facility], [address] [place], [place] [address]    Special Requests   Final    BOTTLES DRAWN AEROBIC AND ANAEROBIC Blood Culture adequate volume Performed at [facility], [address] [place], [place] [address]    Culture  Setup Time   Final    GRAM POSITIVE RODS AEROBIC BOTTLE ONLY CRITICAL RESULT CALLED TO, READ BACK BY AND VERIFIED WITH: JASON ROBBINS AT 05/12/23 2229 AB    Culture GRAM POSITIVE RODS  Final   Report Status PENDING  Incomplete  Respiratory (~20 pathogens) panel by PCR     Status: None   Collection Time: 05/12/23  9:59 AM   Specimen: Nasopharyngeal Swab; Respiratory  Result Value Ref Range Status   Adenovirus NOT DETECTED NOT DETECTED Final   Coronavirus 229E NOT DETECTED NOT DETECTED Final    Comment: (NOTE) The Coronavirus on the Respiratory Panel, DOES NOT test for the novel  Coronavirus (2019 nCoV)    Coronavirus HKU1 NOT DETECTED NOT DETECTED Final   Coronavirus NL63 NOT DETECTED NOT DETECTED Final    Coronavirus OC43 NOT DETECTED NOT DETECTED Final   Metapneumovirus NOT DETECTED NOT DETECTED Final   Rhinovirus / Enterovirus NOT DETECTED NOT DETECTED Final   Influenza A NOT DETECTED NOT DETECTED Final   Influenza B NOT DETECTED NOT DETECTED Final   Parainfluenza Virus 1 NOT DETECTED NOT DETECTED Final   Parainfluenza Virus 2 NOT DETECTED NOT DETECTED Final   Parainfluenza Virus 3 NOT DETECTED NOT DETECTED Final   Parainfluenza Virus 4 NOT DETECTED NOT DETECTED Final   Respiratory Syncytial Virus NOT DETECTED NOT DETECTED Final   Bordetella pertussis NOT DETECTED NOT DETECTED Final   Bordetella Parapertussis NOT DETECTED NOT DETECTED Final   Chlamydophila pneumoniae NOT DETECTED NOT DETECTED Final   Mycoplasma pneumoniae NOT DETECTED NOT DETECTED Final    Comment: Performed at [facility], [address], [place], [place] [address]    Labs: CBC: Recent Labs  Lab 05/11/23 1137 05/12/23 0250 05/13/23 0848  WBC 1.8* 2.7* 4.4  HGB 7.2* 7.5* 7.5*  HCT 23.9* 23.8* 24.1*  MCV 91.6 87.8 88.0  PLT 119* 128* 133*   Basic Metabolic Panel: Recent Labs  Lab 05/11/23 1033 05/12/23 0250 05/13/23 0848  NA 137 138 137  K 3.6 3.4* 3.0*  CL 100 101 102  CO2 27 29 27   GLUCOSE 105* 130* 110*  BUN 12 8 10   CREATININE 0.82 0.88 0.81  CALCIUM 8.4* 8.2* 8.1*   Liver Function Tests: Recent Labs  Lab 05/11/23 1033 05/13/23 0848  AST 16 14*  ALT 10 11  ALKPHOS 66 53  BILITOT 2.0* 1.2*  PROT 6.1* 5.6*  ALBUMIN 2.6* 2.3*   CBG: Recent Labs  Lab 05/12/23 0758 05/12/23 1609 05/13/23 0743  GLUCAP 124* 179* 104*    Discharge time spent: {LESS THAN/GREATER THAN:26388} 30 minutes.  Signed: Pennie Banter, DO [facility] 05/13/2023

## 2023-05-14 ENCOUNTER — Ambulatory Visit: Payer: Medicare HMO

## 2023-05-15 LAB — CULTURE, BLOOD (ROUTINE X 2): Special Requests: ADEQUATE

## 2023-05-16 LAB — CULTURE, BLOOD (ROUTINE X 2): Culture: NO GROWTH

## 2023-05-17 ENCOUNTER — Other Ambulatory Visit: Payer: Self-pay | Admitting: General Surgery

## 2023-05-17 DIAGNOSIS — C50412 Malignant neoplasm of upper-outer quadrant of left female breast: Secondary | ICD-10-CM

## 2023-05-17 DIAGNOSIS — D241 Benign neoplasm of right breast: Secondary | ICD-10-CM

## 2023-05-24 ENCOUNTER — Encounter: Payer: Self-pay | Admitting: Oncology

## 2023-06-07 ENCOUNTER — Telehealth: Payer: Self-pay | Admitting: *Deleted

## 2023-06-07 NOTE — Telephone Encounter (Signed)
Patient called back in afternoon and she said that Dr. Darin Engels a cardiologist at [facility] and his nurse is Victorino Dike and the fax number is [phone].  The patient says that you need to send a letter that the pt. Can get surgery.the phone number for Dr. Darin Engels office is  [phone]

## 2023-06-07 NOTE — Telephone Encounter (Signed)
Patient called and left a message that she has a surgery coming up and the Dr. Darin Engels at [facility] needs a letter that Dr. [name] is given the okay for her to get her surgery.  The patient would like to tell us what information that is needed but I have called her telephone twice and there is no answer or ability to leave a voicemail.  I will check later in the day and see if I can get in touch with her

## 2023-06-08 ENCOUNTER — Other Ambulatory Visit: Payer: Self-pay

## 2023-06-08 DIAGNOSIS — D649 Anemia, unspecified: Secondary | ICD-10-CM

## 2023-06-08 NOTE — Telephone Encounter (Signed)
1. When is her surgery? 2. I would like to see her next week with cbc with diff, ferritin and iron studies and bmp before I can say she is ok to go for surgery from my standpoint. 3. Dr. Darin Engels is her cardiologist and needs to give ok from cardiology standpoint to go for surgery regardless of what I say. Dr. Maia Plan is the surgeon who will need clearance mainly from cardiology and my standpoint. It does not make sense for me to give clearance to Dr. Darin Engels to go for surgery

## 2023-06-13 ENCOUNTER — Inpatient Hospital Stay: Payer: Medicare HMO

## 2023-06-13 ENCOUNTER — Inpatient Hospital Stay: Payer: Medicare HMO | Admitting: Oncology

## 2023-06-14 ENCOUNTER — Other Ambulatory Visit: Payer: Self-pay

## 2023-06-15 ENCOUNTER — Inpatient Hospital Stay (HOSPITAL_BASED_OUTPATIENT_CLINIC_OR_DEPARTMENT_OTHER): Payer: Medicare HMO | Admitting: Oncology

## 2023-06-15 ENCOUNTER — Inpatient Hospital Stay: Payer: Medicare HMO | Attending: Oncology

## 2023-06-15 ENCOUNTER — Encounter: Payer: Self-pay | Admitting: Oncology

## 2023-06-15 VITALS — BP 122/89 | HR 77 | Temp 97.9°F | Resp 18 | Wt 265.0 lb

## 2023-06-15 DIAGNOSIS — G629 Polyneuropathy, unspecified: Secondary | ICD-10-CM | POA: Insufficient documentation

## 2023-06-15 DIAGNOSIS — R200 Anesthesia of skin: Secondary | ICD-10-CM | POA: Diagnosis not present

## 2023-06-15 DIAGNOSIS — Z8616 Personal history of COVID-19: Secondary | ICD-10-CM | POA: Insufficient documentation

## 2023-06-15 DIAGNOSIS — Z79899 Other long term (current) drug therapy: Secondary | ICD-10-CM | POA: Insufficient documentation

## 2023-06-15 DIAGNOSIS — Z171 Estrogen receptor negative status [ER-]: Secondary | ICD-10-CM | POA: Insufficient documentation

## 2023-06-15 DIAGNOSIS — D6481 Anemia due to antineoplastic chemotherapy: Secondary | ICD-10-CM

## 2023-06-15 DIAGNOSIS — E119 Type 2 diabetes mellitus without complications: Secondary | ICD-10-CM | POA: Diagnosis not present

## 2023-06-15 DIAGNOSIS — R5383 Other fatigue: Secondary | ICD-10-CM | POA: Insufficient documentation

## 2023-06-15 DIAGNOSIS — Z886 Allergy status to analgesic agent status: Secondary | ICD-10-CM | POA: Insufficient documentation

## 2023-06-15 DIAGNOSIS — Z5111 Encounter for antineoplastic chemotherapy: Secondary | ICD-10-CM | POA: Diagnosis present

## 2023-06-15 DIAGNOSIS — I110 Hypertensive heart disease with heart failure: Secondary | ICD-10-CM | POA: Insufficient documentation

## 2023-06-15 DIAGNOSIS — N6315 Unspecified lump in the right breast, overlapping quadrants: Secondary | ICD-10-CM | POA: Insufficient documentation

## 2023-06-15 DIAGNOSIS — Z1732 Human epidermal growth factor receptor 2 negative status: Secondary | ICD-10-CM | POA: Diagnosis not present

## 2023-06-15 DIAGNOSIS — Z90710 Acquired absence of both cervix and uterus: Secondary | ICD-10-CM | POA: Insufficient documentation

## 2023-06-15 DIAGNOSIS — I4891 Unspecified atrial fibrillation: Secondary | ICD-10-CM | POA: Diagnosis not present

## 2023-06-15 DIAGNOSIS — Z885 Allergy status to narcotic agent status: Secondary | ICD-10-CM | POA: Insufficient documentation

## 2023-06-15 DIAGNOSIS — Z9049 Acquired absence of other specified parts of digestive tract: Secondary | ICD-10-CM | POA: Diagnosis not present

## 2023-06-15 DIAGNOSIS — C50412 Malignant neoplasm of upper-outer quadrant of left female breast: Secondary | ICD-10-CM | POA: Diagnosis present

## 2023-06-15 DIAGNOSIS — Z7901 Long term (current) use of anticoagulants: Secondary | ICD-10-CM | POA: Diagnosis not present

## 2023-06-15 DIAGNOSIS — T451X5A Adverse effect of antineoplastic and immunosuppressive drugs, initial encounter: Secondary | ICD-10-CM | POA: Insufficient documentation

## 2023-06-15 DIAGNOSIS — I5022 Chronic systolic (congestive) heart failure: Secondary | ICD-10-CM | POA: Insufficient documentation

## 2023-06-15 DIAGNOSIS — Z881 Allergy status to other antibiotic agents status: Secondary | ICD-10-CM | POA: Diagnosis not present

## 2023-06-15 DIAGNOSIS — Z1722 Progesterone receptor negative status: Secondary | ICD-10-CM | POA: Diagnosis not present

## 2023-06-15 DIAGNOSIS — R21 Rash and other nonspecific skin eruption: Secondary | ICD-10-CM | POA: Diagnosis not present

## 2023-06-15 DIAGNOSIS — D649 Anemia, unspecified: Secondary | ICD-10-CM

## 2023-06-15 LAB — CBC WITH DIFFERENTIAL/PLATELET
Abs Immature Granulocytes: 0.02 10*3/uL (ref 0.00–0.07)
Basophils Absolute: 0 10*3/uL (ref 0.0–0.1)
Basophils Relative: 1 %
Eosinophils Absolute: 0.1 10*3/uL (ref 0.0–0.5)
Eosinophils Relative: 2 %
HCT: 33.5 % — ABNORMAL LOW (ref 36.0–46.0)
Hemoglobin: 9.6 g/dL — ABNORMAL LOW (ref 12.0–15.0)
Immature Granulocytes: 0 %
Lymphocytes Relative: 19 %
Lymphs Abs: 1.1 10*3/uL (ref 0.7–4.0)
MCH: 28 pg (ref 26.0–34.0)
MCHC: 28.7 g/dL — ABNORMAL LOW (ref 30.0–36.0)
MCV: 97.7 fL (ref 80.0–100.0)
Monocytes Absolute: 0.5 10*3/uL (ref 0.1–1.0)
Monocytes Relative: 9 %
Neutro Abs: 3.9 10*3/uL (ref 1.7–7.7)
Neutrophils Relative %: 69 %
Platelets: 159 10*3/uL (ref 150–400)
RBC: 3.43 MIL/uL — ABNORMAL LOW (ref 3.87–5.11)
RDW: 18 % — ABNORMAL HIGH (ref 11.5–15.5)
WBC: 5.7 10*3/uL (ref 4.0–10.5)
nRBC: 0 % (ref 0.0–0.2)

## 2023-06-15 LAB — CMP (CANCER CENTER ONLY)
ALT: 9 U/L (ref 0–44)
AST: 17 U/L (ref 15–41)
Albumin: 2.7 g/dL — ABNORMAL LOW (ref 3.5–5.0)
Alkaline Phosphatase: 70 U/L (ref 38–126)
Anion gap: 9 (ref 5–15)
BUN: 11 mg/dL (ref 8–23)
CO2: 23 mmol/L (ref 22–32)
Calcium: 8.6 mg/dL — ABNORMAL LOW (ref 8.9–10.3)
Chloride: 105 mmol/L (ref 98–111)
Creatinine: 0.59 mg/dL (ref 0.44–1.00)
GFR, Estimated: >60 mL/min (ref >60)
Glucose, Bld: 102 mg/dL — ABNORMAL HIGH (ref 70–99)
Potassium: 4.3 mmol/L (ref 3.5–5.1)
Sodium: 137 mmol/L (ref 135–145)
Total Bilirubin: 1.4 mg/dL — ABNORMAL HIGH (ref 0.0–1.2)
Total Protein: 7 g/dL (ref 6.5–8.1)

## 2023-06-15 LAB — VITAMIN B12: Vitamin B-12: 543 pg/mL (ref 180–914)

## 2023-06-15 LAB — FERRITIN: Ferritin: 161 ng/mL (ref 11–307)

## 2023-06-15 LAB — IRON AND TIBC
Iron: 88 ug/dL (ref 28–170)
Saturation Ratios: 36 % — ABNORMAL HIGH (ref 10.4–31.8)
TIBC: 248 ug/dL — ABNORMAL LOW (ref 250–450)
UIBC: 160 ug/dL

## 2023-06-15 LAB — FOLATE: Folate: 10.9 ng/mL (ref >5.9)

## 2023-06-15 NOTE — Progress Notes (Signed)
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  This was followed by a diagnostic mammogram and ultrasound.  Mammogram showed 3.4 x 1.8 x 3.5 cm mass at the 3 o'clock position of the left breast.  There was also an intraductal mass located at the 12 o'clock position of the right breast.  No axillary adenopathy was noted.  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  Right breast mass showed fragments of epithelial proliferation with atypia differential diagnosis includes intraductal papilloma with at least atypical ductal hyperplasia.   Patient has a history of heart failure with reduced ejection fraction and atrial fibrillation for which she follows up with Dr. Darin Engels from cardiology.  Plan is to proceed with 12 weeks of weekly CarboTaxol chemotherapy along with Keytruda.  Anthracycline will not be given.  Patient could not tolerate Keytruda as evidenced by diarrhea and significant skin rash and was therefore stopped after 2 cycles.  Patient had significant skin rash that persisted despite stopping Keytruda and there was concern for allergic reaction  as well because of which Taxol was stopped after 4 cycles and she was switched to Abraxane.  She completed 11 weekly cycles of carboplatin and Abraxane.  She had significant fatigue and required hospitalization after that and therefore 12th cycle was not completed    Interval history-overall patient is feeling better since her hospital discharge.  Appetite is slowly improving but she is still not back to her baseline prior to starting chemotherapy.  Prior to chemotherapy patient was independent of her ADLs and was able to chores around the house.  Now she needs assistance with most of her ADLs.  She is using a walker to ambulate.  No recent falls.  Reports numbness over her bilateral fingertips making it difficult for her to carry her ADLs  ECOG PS- 2 Pain scale- 0   Review of systems- Review of Systems  Constitutional:  Positive for malaise/fatigue. Negative for chills, fever and weight loss.  HENT:  Negative for congestion, ear discharge and nosebleeds.   Eyes:  Negative for blurred vision.  Respiratory:  Negative for cough, hemoptysis, sputum production, shortness of breath and wheezing.   Cardiovascular:  Negative for chest pain, palpitations, orthopnea and claudication.  Gastrointestinal:  Negative for abdominal pain, blood in stool, constipation, diarrhea, heartburn, melena, nausea and vomiting.  Genitourinary:  Negative for dysuria, flank pain, frequency, hematuria and urgency.  Musculoskeletal:  Negative for back pain, joint pain and myalgias.  Skin:  Negative for rash.  Neurological:  Positive for sensory change (Peripheral neuropathy). Negative for dizziness, tingling, focal weakness, seizures, weakness and headaches.  Endo/Heme/Allergies:  Does not bruise/bleed easily.  Psychiatric/Behavioral:  Negative for depression and suicidal ideas. The  patient does not have insomnia.       Allergies  Allergen Reactions   Aspirin Other (See Comments)    Bleeding risk since patient is on  xarelta   Ciprofloxacin Other (See Comments)    Other Reaction: RASH ITCHING    Codeine Other (See Comments)   Diazepam Other (See Comments) and Nausea And Vomiting   Dofetilide Other (See Comments)    Other Reaction: arrhythmia    Hydrocodone-Acetaminophen Other (See Comments) and Nausea And Vomiting   Morphine Nausea And Vomiting   Niacin Other (See Comments)    fatigue   Oxycodone-Acetaminophen Other (See Comments) and Nausea And Vomiting   Sotalol Other (See Comments)    Other Reaction: Prolonged QTc    Amiodarone Other (See Comments)    Affecting liver    Pyridoxine Other (See Comments)    Splitting headache   Sulfamethoxazole-Trimethoprim Swelling   Other Rash    EKG sticker electrodes extended time     Past Medical History:  Diagnosis Date   Atrial fibrillation (HCC)    Atrial fibrillation with RVR (HCC)    Breast cancer, left (HCC) 11/2022   CHF (congestive heart failure) (HCC)    Complication of anesthesia    respiratory depression; needs to sit up immediately after   Coronary artery disease    Edema of both lower extremities    GERD (gastroesophageal reflux disease)    Hypertension, essential    Hyperthyroidism    Morbid obesity with BMI of 45.0-49.9, adult (HCC)    Pneumonia    Pneumonia due to COVID-19 virus    Sick sinus syndrome (HCC)    Sleep apnea    Type 2 diabetes mellitus without complication (HCC)    Vitamin D deficiency      Past Surgical History:  Procedure Laterality Date   ABSCESS DRAINAGE     BREAST BIOPSY Left 01/15/2023   [place] Bx Left Ribbon - Path pending   breast biopsy Right 01/15/2023   [place] Bx Right Coil Clip - Path Pending   BREAST BIOPSY Right 01/15/2023   [place] RT BREAST BX W LOC DEV 1ST LESION IMG BX SPEC US GUIDE 01/15/2023 [facility]   BREAST BIOPSY Left 01/15/2023   [place] LT BREAST BX W LOC DEV 1ST LESION IMG BX SPEC US GUIDE 01/15/2023 [facility]   CARDIAC CATHETERIZATION     CARDIAC PACEMAKER PLACEMENT  06/26/2008    [employer] dual chamber   CESAREAN SECTION     x 3   CHOLECYSTECTOMY     PACEMAKER GENERATOR CHANGE  03/28/2016   PORTACATH PLACEMENT Right 01/31/2023   Procedure: INSERTION PORT-A-CATH;  Surgeon: Carolan Shiver, MD;  Location: [facility];  Service: General;  Laterality: Right;   TOTAL ABDOMINAL HYSTERECTOMY     TUBAL LIGATION      Social History   Socioeconomic History   Marital status: Single    Spouse name: Not on file   Number of children: 3   Years of education: Not on file   Highest education level: Not on file  Occupational History   Not on file  Tobacco Use   Smoking status: Never   Smokeless tobacco: Never  Vaping Use   Vaping status: Never Used  Substance and Sexual Activity   Alcohol use: Not Currently   Drug use: Never   Sexual activity: Not Currently  Other Topics Concern   Not on file  Social History Narrative   Not on file   Social Drivers of Health   Financial  Resource Strain: Low Risk  (09/06/2022)   Received from [facility]   Overall Financial Resource Strain (CARDIA)    Difficulty of Paying Living Expenses: Not hard at all  Food Insecurity: No Food Insecurity (05/12/2023)   Hunger Vital Sign    Worried About Running Out of Food in the Last Year: Never true    Ran Out of Food in the Last Year: Never true  Transportation Needs: No Transportation Needs (05/12/2023)   PRAPARE - Administrator, Civil Service (Medical): No    Lack of Transportation (Non-Medical): No  Physical Activity: Not on file  Stress: Not on file  Social Connections: Not on file  Intimate Partner Violence: Not At Risk (05/12/2023)   Humiliation, Afraid, Rape, and Kick questionnaire    Fear of Current or Ex-Partner: No    Emotionally Abused: No    Physically Abused: No    Sexually Abused: No    Family History  Problem Relation Age of Onset   Mesothelioma Father      Current Outpatient Medications:    citalopram (CELEXA) 10 MG tablet,  Take 10 mg by mouth daily., Disp: , Rfl:    clobetasol cream (TEMOVATE) 0.05 %, Apply topically., Disp: , Rfl:    collagenase (SANTYL) 250 UNIT/GM ointment, Apply topically daily., Disp: 15 g, Rfl: 0   dexamethasone (DECADRON) 4 MG tablet, Take 2 tablets daily for 2 days, start the day after chemotherapy. Take with food., Disp: 30 tablet, Rfl: 1   diphenoxylate-atropine (LOMOTIL) 2.5-0.025 MG tablet, Take 1 tablet by mouth 4 (four) times daily as needed for diarrhea or loose stools., Disp: 30 tablet, Rfl: 0   feeding supplement (ENSURE ENLIVE / ENSURE PLUS) LIQD, Take 237 mLs by mouth 2 (two) times daily between meals., Disp: , Rfl:    lidocaine-prilocaine (EMLA) cream, Apply to affected area once, Disp: 30 g, Rfl: 3   magic mouthwash w/lidocaine SOLN, Take 15 mLs by mouth 4 (four) times daily as needed for mouth pain. Suspension contains equal amounts of Maalox Extra Strength, nystatin, diphenhydramine and lidocaine., Disp: 250 mL, Rfl: 1   metoprolol succinate (TOPROL-XL) 25 MG 24 hr tablet, Take 12.5 mg by mouth daily., Disp: , Rfl:    Multiple Vitamins-Minerals (CENTRUM SILVER 50+WOMEN PO), Take 1 tablet by mouth daily., Disp: , Rfl:    nitroGLYCERIN (NITROSTAT) 0.4 MG SL tablet, Place 0.4 mg under the tongue every 5 (five) minutes as needed for chest pain. (Patient not taking: Reported on 02/08/2023), Disp: , Rfl:    nutrition supplement, JUVEN, (JUVEN) PACK, Take 1 packet by mouth 2 (two) times daily between meals., Disp: 30 each, Rfl: 0   nystatin (MYCOSTATIN) 100000 UNIT/ML suspension, Take 5 mLs (500,000 Units total) by mouth 4 (four) times daily., Disp: 473 mL, Rfl: 0   ondansetron (ZOFRAN) 4 MG tablet, Take 1 tablet (4 mg total) by mouth daily as needed for nausea or vomiting., Disp: 30 tablet, Rfl: 1   potassium chloride (KLOR-CON) 10 MEQ tablet, Take 30 mEq by mouth 2 (two) times daily. Am and 4 pm, Disp: , Rfl:    prochlorperazine (COMPAZINE) 10 MG tablet, Take 1 tablet (10 mg total) by  mouth every 6 (six) hours as needed for nausea or vomiting., Disp: 30 tablet, Rfl: 1   rivaroxaban (XARELTO) 20 MG TABS tablet, Take 20 mg by mouth every evening., Disp: , Rfl:    torsemide (DEMADEX) 20 MG tablet, Take 40 mg by mouth daily in the afternoon. 4  pm, Disp: , Rfl:    triamcinolone ointment (KENALOG) 0.5 %, Apply 1 Application topically 2 (two) times daily as needed (itching from rash)., Disp: 30 g, Rfl: 1  Physical exam:  Vitals:   06/15/23 0842  BP: 122/89  Pulse: 77  Resp: 18  Temp: 97.9 F (36.6 C)  TempSrc: Tympanic  SpO2: 97%  Weight: 265 lb (120.2 kg)   Physical Exam Constitutional:      Comments: Sitting in a wheelchair and appears in no acute distress  Cardiovascular:     Rate and Rhythm: Normal rate and regular rhythm.     Heart sounds: Normal heart sounds.  Pulmonary:     Effort: Pulmonary effort is normal.     Breath sounds: Normal breath sounds.  Abdominal:     General: Bowel sounds are normal.     Palpations: Abdomen is soft.  Musculoskeletal:     Comments: Changes of chronic stasis dermatitis bilaterally  Skin:    General: Skin is warm and dry.  Neurological:     Mental Status: She is alert and oriented to person, place, and time.         Latest Ref Rng & Units 06/15/2023    8:16 AM  CMP  Glucose 70 - 99 mg/dL 098   BUN 8 - 23 mg/dL 11   Creatinine 1.19 - 1.00 mg/dL 1.47   Sodium 829 - 562 mmol/L 137   Potassium 3.5 - 5.1 mmol/L 4.3   Chloride 98 - 111 mmol/L 105   CO2 22 - 32 mmol/L 23   Calcium 8.9 - 10.3 mg/dL 8.6   Total Protein 6.5 - 8.1 g/dL 7.0   Total Bilirubin 0.0 - 1.2 mg/dL 1.4   Alkaline Phos 38 - 126 U/L 70   AST 15 - 41 U/L 17   ALT 0 - 44 U/L 9       Latest Ref Rng & Units 06/15/2023    8:16 AM  CBC  WBC 4.0 - 10.5 K/uL 5.7   Hemoglobin 12.0 - 15.0 g/dL 9.6   Hematocrit 13.0 - 46.0 % 33.5   Platelets 150 - 400 K/uL 159      Assessment and plan- Patient is a 78 y.o. female with history of stage II triple negative  left breast cancer s/p 11 cycles of weekly carboplatin Abraxane chemotherapy.  She could not tolerate Keytruda due to skin rash.  She is here for assessment prior to undergoing definitive surgery for her breast cancer.  Patient has completed neoadjuvant chemotherapy and from a hematology standpoint she can proceed with surgery at this time.  White count and platelets are normal and hemoglobin is improved to 9.6.  She does not require any blood transfusion at this time.Iron studies B12 and folate are currently pending.  I will tentatively see her back in 6 weeks to discuss pathology results and further management.  Plan is for left lumpectomy with sentinel lymph node biopsy and excisional biopsy of the right atypical ductal hyperplasia lesion.  Patient is also seen cardiology Dr. Darin Engels and had another echocardiogram done which showed an EF of 50-55%.  She has a follow-up coming up with him soon as well.   Visit Diagnosis 1. Antineoplastic chemotherapy induced anemia   2. Malignant neoplasm of upper-outer quadrant of left breast in female, estrogen receptor negative (HCC)      Dr. Owens Shark, MD, MPH [facility] [phone] 06/15/2023 9:25 AM

## 2023-06-16 ENCOUNTER — Other Ambulatory Visit: Payer: Self-pay

## 2023-06-19 ENCOUNTER — Other Ambulatory Visit: Payer: Self-pay | Admitting: General Surgery

## 2023-06-19 DIAGNOSIS — Z171 Estrogen receptor negative status [ER-]: Secondary | ICD-10-CM

## 2023-06-20 ENCOUNTER — Other Ambulatory Visit: Payer: Self-pay | Admitting: General Surgery

## 2023-06-20 ENCOUNTER — Ambulatory Visit: Payer: Self-pay | Admitting: General Surgery

## 2023-06-20 DIAGNOSIS — Z171 Estrogen receptor negative status [ER-]: Secondary | ICD-10-CM

## 2023-06-20 DIAGNOSIS — D241 Benign neoplasm of right breast: Secondary | ICD-10-CM

## 2023-06-22 ENCOUNTER — Encounter: Payer: Self-pay | Admitting: *Deleted

## 2023-06-26 ENCOUNTER — Ambulatory Visit: Payer: Self-pay | Admitting: General Surgery

## 2023-06-26 NOTE — H&P (View-Only) (Signed)
 History of Present Illness Jennifer Gregory is a 78 year old female with left breast invasive mammary carcinoma, triple negative, and right breast atypical hyperplasia who presents for preoperative evaluation.   Patient completed neoadjuvant chemotherapy.  She got significant side effects with significant weakness, tiredness.  She was admitted to the hospital due to the side effect.  Because delaying the timing of surgery after neoadjuvant chemotherapy.  Finally patient is feeling better.   She is scheduled for a left breast partial mastectomy with axillary sentinel lymph node biopsy and a right breast excision biopsy. She has experienced significant improvement in her condition following chemotherapy, although she suffered severe side effects, including hospitalization in December due to extreme sickness and inability to eat or drink. The mass on the left side of her breast, which she previously could feel, is no longer palpable.   She is concerned about taking blood thinners and her oxygen levels post-surgery, noting that she cannot lay flat as it affects her breathing. She recalls a previous surgery where she was positioned sitting up, which was successful.     She uses ice packs for swelling and is considering a supportive bra post-surgery to manage discomfort from fluid accumulation. She plans to purchase a sports bra and considers visiting a medical supply store for proper fitting. She confirms that she will be able to shower daily post-surgery due to the use of surgical glue on the incisions.      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/30/2012   Chronic anticoagulation 03/20/2022   DIABETES MELLITUS     Fitting or adjustment of cardiac pacemaker 08/29/2016   GERD (gastroesophageal reflux disease)     Heart abnormality (HHS-HCC)     Hypertension     Hyperthyroidism      Medical non-compliance 04/12/2016   Persistent atrial fibrillation (CMS/HHS-HCC) 08/06/2013   Sick sinus syndrome (CMS/HHS-HCC) 12/30/2012          PAST SURGICAL HISTORY:   Past Surgical History       Past Surgical History:  Procedure Laterality Date   INSERT / REPLACE / REMOVE PACEMAKER   03/28/2016    generator replacement   CARDIAC PACEMAKER PLACEMENT       CESAREAN SECTION       CHOLECYSTECTOMY       HYSTERECTOMY       TUBAL LIGATION               MEDICATIONS:  Encounter Medications        Outpatient Encounter Medications as of 06/26/2023  Medication Sig Dispense Refill   albuterol (PROVENTIL) 2.5 mg /3 mL (0.083 %) nebulizer solution Take 2.5 mg by nebulization as needed       citalopram (CELEXA) 10 MG tablet Take 1 tablet (10 mg total) by mouth once daily 90 tablet 3   diphenoxylate-atropine (LOMOTIL) 2.5-0.025 mg tablet Take 1 tablet by mouth       incontinence pad, liner, disp (POISE PANTILINERS) Pads Use one pad as directed 240 each 11   metoprolol SUCCinate (TOPROL-XL) 25 MG XL tablet Take 0.5 tablets (12.5 mg total) by mouth once daily 45 tablet 3   nitroGLYcerin (NITROSTAT) 0.4 MG SL tablet Place 1 tablet (0.4 mg total) under the tongue every 5 (five) minutes as needed X3. If not improvement call EMS. 25 tablet 4   potassium chloride (KLOR-CON)  10 MEQ ER tablet TAKE 3 TABLETS BY MOUTH TWICE DAILY. 540 tablet 3   rivaroxaban (XARELTO) 20 mg tablet Take 1 tablet (20 mg total) by mouth once daily 90 tablet 3   TORsemide (DEMADEX) 20 MG tablet Take 2 tablets (40 mg total) by mouth once daily 180 tablet 3   traMADoL (ULTRAM) 50 mg tablet         triamcinolone 0.5 % ointment Apply 1 Application topically (Patient not taking: Reported on 06/26/2023)        No facility-administered encounter medications on file as of 06/26/2023.        ALLERGIES:   Arb-angiotensin receptor antagonist, Aspirin, Ciprofloxacin, Codeine, Morphine, Niacin, Percocet [oxycodone-acetaminophen],  Statins-hmg-coa reductase inhibitors, Sulfa (sulfonamide antibiotics), Tikosyn [dofetilide], Valium [diazepam], Vicodin [hydrocodone-acetaminophen], Sotalol, Keytruda [pembrolizumab], Pacerone [amiodarone], Septra [sulfamethoxazole-trimethoprim], Vitamin b6 [pyridoxine], and Zarxio [filgrastim-sndz]   SOCIAL HISTORY:  Social History  Social History         Socioeconomic History   Marital status: Divorced  Tobacco Use   Smoking status: Never   Smokeless tobacco: Never  Vaping Use   Vaping status: Never Used  Substance and Sexual Activity   Alcohol use: No      Alcohol/week: 0.0 standard drinks of alcohol   Drug use: No   Sexual activity: Not Currently      Partners: Male  Social History Narrative    Single. Children here and in [place]. Retired Engineer, civil (consulting). Grandson lives with her.         09/06/2022    Likes/Enjoys/What fills your day?:     Military Service: None    Driving Status: Reports driving, independently    Home: One Story    Your Bedrooms is on: First Level    Fewest Steps to enter the home: Ramp with railings    Other persons in the home: son    Pets: dog    Medical equipment you use daily: Cane, Single point and Rollator    Medical equipment available in the home: Blood Pressure Machine and Glucometer    Audiology/Hearing: Hearing loss bilaterally. Last screening Date: April > 5 years ago. Screening is: In need,     Dermatology: [facility]. Last screening Date: April > 5 years ago Screening is: In need,     Dental: Poor dentition. Last screening Date: April > 5 years ago. Screening is: In need,     Vision: Wears prescription glasses. Last screening Date: April 2021. Screening is: In need, Provider Name: Jordan Hawks          Social Drivers of Health        Financial Resource Strain: Low Risk  (09/06/2022)    Overall Financial Resource Strain (CARDIA)     Difficulty of Paying Living Expenses: Not hard at all  Food Insecurity: No Food Insecurity (05/12/2023)    Received from  [facility]    Hunger Vital Sign     Worried About Running Out of Food in the Last Year: Never true     Ran Out of Food in the Last Year: Never true  Transportation Needs: No Transportation Needs (05/12/2023)    Received from [facility] - Transportation     Lack of Transportation (Medical): No     Lack of Transportation (Non-Medical): No        FAMILY HISTORY:  Family History        Family History  Problem Relation Name Age of Onset   High blood pressure (Hypertension) Mother  Thyroid disease Mother       Diabetes type II Son       Thyroid disease Sister            GENERAL REVIEW OF SYSTEMS:    General ROS: negative for - chills, fatigue, fever, weight gain or weight loss Allergy and Immunology ROS: negative for - hives  Hematological and Lymphatic ROS: negative for - bleeding problems or bruising, negative for palpable nodes Endocrine ROS: negative for - heat or cold intolerance, hair changes Respiratory ROS: negative for - cough, shortness of breath or wheezing Cardiovascular ROS: no chest pain or palpitations GI ROS: negative for nausea, vomiting, abdominal pain, diarrhea, constipation Musculoskeletal ROS: negative for - joint swelling or muscle pain Neurological ROS: negative for - confusion, syncope Dermatological ROS: negative for pruritus and rash   PHYSICAL EXAM:     Vitals:    06/26/23 1323  BP: (!) 154/94  Pulse: (!) 115  .  Ht:160 cm (5\' 3" ) Wt:(!) 123.8 kg (273 lb) WUJ:WJXB surface area is 2.35 meters squared. Body mass index is 48.36 kg/m.Marland Kitchen   GENERAL: Alert, active, oriented x3   HEENT: Pupils equal reactive to light. Extraocular movements are intact. Sclera clear. Palpebral conjunctiva normal red color.Pharynx clear.   NECK: Supple with no palpable mass and no adenopathy.   LUNGS: Sound clear with no rales rhonchi or wheezes.   HEART: Regular rhythm S1 and S2 without murmur.   BREAST: Large pedunculated breast.  Possible  palpable mass in the left breast 3 o'clock position but not as obvious as previous examination.  No palpable mass on the right breast.  No other skin changes.  No axillary adenopathy clinically.   EXTREMITIES: Well-developed well-nourished symmetrical with no dependent edema.   NEUROLOGICAL: Awake alert oriented, facial expression symmetrical, moving all extremities.   Results RADIOLOGY I have reevaluated the images of mammogram of January 03, 2023 showing the 3.5 cm mass at the 3 o'clock position of the left breast    Assessment & Plan Left Breast Invasive Mammary Carcinoma, Triple Negative A left breast partial mastectomy is scheduled following neoadjuvant chemotherapy, which caused severe side effects and required hospitalization. The mass is no longer obviously palpable, suggesting a positive response, but final pathology will confirm a complete response. Surgery risks include infection, bleeding, and potential need for further surgery, while benefits involve cancerous tissue removal and potential cure. Alternatives are mastectomy or continued monitoring. The plan is to perform the partial mastectomy and an axillary sentinel lymph node biopsy on July 11, 2023.   Right Breast Atypical Hyperplasia An excision biopsy is planned for a small mass near the nipple. Preoperative imaging will assess the mass size and response to previous treatments. Risks include infection, bleeding, and further surgery, with benefits of removing abnormal tissue and potential cancer prevention. Alternatives include continued monitoring. The excision biopsy is scheduled for July 11, 2023, with a mammogram and ultrasound on July 04, 2023, and surgical markers will be placed for guidance.   Post-Chemotherapy Recovery Recovery from chemotherapy is ongoing, with mobility challenges requiring assistance. Blood thinners should be avoided 48 hours before surgery, and the surgical team must be informed about oxygen  needs and positioning requirements. Risks involve delayed recovery and surgical complications, while benefits include improved mobility and quality of life. Alternatives are physical therapy and supportive care.    Postoperative care includes using ice packs for swelling and wearing a supportive sports bra to manage discomfort and fluid accumulation.   Follow-up A mammogram  and ultrasound are scheduled for July 04, 2023, with surgery on July 11, 2023. The specific surgery time will be provided closer to the date.    Malignant neoplasm of upper-outer quadrant of left breast in female, estrogen receptor negative (CMS/HHS-HCC) [B14.782, Z17.1]           Patient and her son verbalized understanding, all questions were answered, and were agreeable with the plan outlined above.    Carolan Shiver, MD   Electronically signed by Carolan Shiver, MD

## 2023-06-26 NOTE — H&P (Signed)
 History of Present Illness Jennifer Gregory is a 78 year old female with left breast invasive mammary carcinoma, triple negative, and right breast atypical hyperplasia who presents for preoperative evaluation.   Patient completed neoadjuvant chemotherapy.  She got significant side effects with significant weakness, tiredness.  She was admitted to the hospital due to the side effect.  Because delaying the timing of surgery after neoadjuvant chemotherapy.  Finally patient is feeling better.   She is scheduled for a left breast partial mastectomy with axillary sentinel lymph node biopsy and a right breast excision biopsy. She has experienced significant improvement in her condition following chemotherapy, although she suffered severe side effects, including hospitalization in December due to extreme sickness and inability to eat or drink. The mass on the left side of her breast, which she previously could feel, is no longer palpable.   She is concerned about taking blood thinners and her oxygen levels post-surgery, noting that she cannot lay flat as it affects her breathing. She recalls a previous surgery where she was positioned sitting up, which was successful.     She uses ice packs for swelling and is considering a supportive bra post-surgery to manage discomfort from fluid accumulation. She plans to purchase a sports bra and considers visiting a medical supply store for proper fitting. She confirms that she will be able to shower daily post-surgery due to the use of surgical glue on the incisions.      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/30/2012   Chronic anticoagulation 03/20/2022   DIABETES MELLITUS     Fitting or adjustment of cardiac pacemaker 08/29/2016   GERD (gastroesophageal reflux disease)     Heart abnormality (HHS-HCC)     Hypertension     Hyperthyroidism      Medical non-compliance 04/12/2016   Persistent atrial fibrillation (CMS/HHS-HCC) 08/06/2013   Sick sinus syndrome (CMS/HHS-HCC) 12/30/2012          PAST SURGICAL HISTORY:   Past Surgical History       Past Surgical History:  Procedure Laterality Date   INSERT / REPLACE / REMOVE PACEMAKER   03/28/2016    generator replacement   CARDIAC PACEMAKER PLACEMENT       CESAREAN SECTION       CHOLECYSTECTOMY       HYSTERECTOMY       TUBAL LIGATION               MEDICATIONS:  Encounter Medications        Outpatient Encounter Medications as of 06/26/2023  Medication Sig Dispense Refill   albuterol  (PROVENTIL ) 2.5 mg /3 mL (0.083 %) nebulizer solution Take 2.5 mg by nebulization as needed       citalopram  (CELEXA ) 10 MG tablet Take 1 tablet (10 mg total) by mouth once daily 90 tablet 3   diphenoxylate -atropine  (LOMOTIL ) 2.5-0.025 mg tablet Take 1 tablet by mouth       incontinence pad, liner, disp (POISE PANTILINERS) Pads Use one pad as directed 240 each 11   metoprolol  SUCCinate (TOPROL -XL) 25 MG XL tablet Take 0.5 tablets (12.5 mg total) by mouth once daily 45 tablet 3   nitroGLYcerin (NITROSTAT) 0.4 MG SL tablet Place 1 tablet (0.4 mg total) under the tongue every 5 (five) minutes as needed X3. If not improvement call EMS. 25 tablet 4   potassium chloride  (KLOR-CON )  10 MEQ ER tablet TAKE 3 TABLETS BY MOUTH TWICE DAILY. 540 tablet 3   rivaroxaban  (XARELTO ) 20 mg tablet Take 1 tablet (20 mg total) by mouth once daily 90 tablet 3   TORsemide (DEMADEX) 20 MG tablet Take 2 tablets (40 mg total) by mouth once daily 180 tablet 3   traMADoL  (ULTRAM ) 50 mg tablet         triamcinolone  0.5 % ointment Apply 1 Application topically (Patient not taking: Reported on 06/26/2023)        No facility-administered encounter medications on file as of 06/26/2023.        ALLERGIES:   Arb-angiotensin receptor antagonist, Aspirin, Ciprofloxacin, Codeine, Morphine , Niacin, Percocet [oxycodone -acetaminophen ],  Statins-hmg-coa reductase inhibitors, Sulfa (sulfonamide antibiotics), Tikosyn [dofetilide], Valium [diazepam], Vicodin [hydrocodone -acetaminophen ], Sotalol, Keytruda  [pembrolizumab ], Pacerone [amiodarone], Septra [sulfamethoxazole-trimethoprim], Vitamin b6 [pyridoxine], and Zarxio  [filgrastim -sndz]   SOCIAL HISTORY:  Social History  Social History         Socioeconomic History   Marital status: Divorced  Tobacco Use   Smoking status: Never   Smokeless tobacco: Never  Vaping Use   Vaping status: Never Used  Substance and Sexual Activity   Alcohol use: No      Alcohol/week: 0.0 standard drinks of alcohol   Drug use: No   Sexual activity: Not Currently      Partners: Male  Social History Narrative    Single. Children here and in [place]. Retired engineer, civil (consulting). Grandson lives with her.         09/06/2022    Likes/Enjoys/What fills your day?:     Military Service: None    Driving Status: Reports driving, independently    Home: One Story    Your Bedrooms is on: First Level    Fewest Steps to enter the home: Ramp with railings    Other persons in the home: son    Pets: dog    Medical equipment you use daily: Cane, Single point and Rollator    Medical equipment available in the home: Blood Pressure Machine and Glucometer    Audiology/Hearing: Hearing loss bilaterally. Last screening Date: April > 5 years ago. Screening is: In need,     Dermatology: [facility]. Last screening Date: April > 5 years ago Screening is: In need,     Dental: Poor dentition. Last screening Date: April > 5 years ago. Screening is: In need,     Vision: Wears prescription glasses. Last screening Date: April 2021. Screening is: In need, Provider Name: Cor[facility]          Social Drivers of Health        Financial Resource Strain: Low Risk  (09/06/2022)    Overall Financial Resource Strain (CARDIA)     Difficulty of Paying Living Expenses: Not hard at all  Food Insecurity: No Food Insecurity (05/12/2023)    [facility] from  [facility]    Hunger Vital Sign     Worried About Running Out of Food in the Last Year: Never true     Ran Out of Food in the Last Year: Never true  Transportation Needs: No Transportation Needs (05/12/2023)    Received from [facility] - Transportation     Lack of Transportation (Medical): No     Lack of Transportation (Non-Medical): No        FAMILY HISTORY:  Family History        Family History  Problem Relation Name Age of Onset   High blood pressure (Hypertension) Mother  Thyroid  disease Mother       Diabetes type II Son       Thyroid  disease Sister            GENERAL REVIEW OF SYSTEMS:    General ROS: negative for - chills, fatigue, fever, weight gain or weight loss Allergy and Immunology ROS: negative for - hives  Hematological and Lymphatic ROS: negative for - bleeding problems or bruising, negative for palpable nodes Endocrine ROS: negative for - heat or cold intolerance, hair changes Respiratory ROS: negative for - cough, shortness of breath or wheezing Cardiovascular ROS: no chest pain or palpitations GI ROS: negative for nausea, vomiting, abdominal pain, diarrhea, constipation Musculoskeletal ROS: negative for - joint swelling or muscle pain Neurological ROS: negative for - confusion, syncope Dermatological ROS: negative for pruritus and rash   PHYSICAL EXAM:     Vitals:    06/26/23 1323  BP: (!) 154/94  Pulse: (!) 115  .  Ht:160 cm (5' 3) Wt:(!) 123.8 kg (273 lb) ADJ:Anib surface area is 2.35 meters squared. Body mass index is 48.36 kg/m.SABRA   GENERAL: Alert, active, oriented x3   HEENT: Pupils equal reactive to light. Extraocular movements are intact. Sclera clear. Palpebral conjunctiva normal red color.Pharynx clear.   NECK: Supple with no palpable mass and no adenopathy.   LUNGS: Sound clear with no rales rhonchi or wheezes.   HEART: Regular rhythm S1 and S2 without murmur.   BREAST: Large pedunculated breast.  Possible  palpable mass in the left breast 3 o'clock position but not as obvious as previous examination.  No palpable mass on the right breast.  No other skin changes.  No axillary adenopathy clinically.   EXTREMITIES: Well-developed well-nourished symmetrical with no dependent edema.   NEUROLOGICAL: Awake alert oriented, facial expression symmetrical, moving all extremities.   Results RADIOLOGY I have reevaluated the images of mammogram of January 03, 2023 showing the 3.5 cm mass at the 3 o'clock position of the left breast    Assessment & Plan Left Breast Invasive Mammary Carcinoma, Triple Negative A left breast partial mastectomy is scheduled following neoadjuvant chemotherapy, which caused severe side effects and required hospitalization. The mass is no longer obviously palpable, suggesting a positive response, but final pathology will confirm a complete response. Surgery risks include infection, bleeding, and potential need for further surgery, while benefits involve cancerous tissue removal and potential cure. Alternatives are mastectomy or continued monitoring. The plan is to perform the partial mastectomy and an axillary sentinel lymph node biopsy on July 11, 2023.   Right Breast Atypical Hyperplasia An excision biopsy is planned for a small mass near the nipple. Preoperative imaging will assess the mass size and response to previous treatments. Risks include infection, bleeding, and further surgery, with benefits of removing abnormal tissue and potential cancer prevention. Alternatives include continued monitoring. The excision biopsy is scheduled for July 11, 2023, with a mammogram and ultrasound on July 04, 2023, and surgical markers will be placed for guidance.   Post-Chemotherapy Recovery Recovery from chemotherapy is ongoing, with mobility challenges requiring assistance. Blood thinners should be avoided 48 hours before surgery, and the surgical team must be informed about oxygen  needs and positioning requirements. Risks involve delayed recovery and surgical complications, while benefits include improved mobility and quality of life. Alternatives are physical therapy and supportive care.    Postoperative care includes using ice packs for swelling and wearing a supportive sports bra to manage discomfort and fluid accumulation.   Follow-up A mammogram  and ultrasound are scheduled for July 04, 2023, with surgery on July 11, 2023. The specific surgery time will be provided closer to the date.    Malignant neoplasm of upper-outer quadrant of left breast in female, estrogen receptor negative (CMS/HHS-HCC) [R49.587, Z17.1]           Patient and her son verbalized understanding, all questions were answered, and were agreeable with the plan outlined above.    Lucas Sjogren, MD   Electronically signed by Lucas Sjogren, MD

## 2023-07-03 ENCOUNTER — Encounter
Admission: RE | Admit: 2023-07-03 | Discharge: 2023-07-03 | Disposition: A | Discharge status: Home / Self Care | Payer: Medicare HMO | Source: Ambulatory Visit | Attending: General Surgery | Admitting: General Surgery

## 2023-07-03 ENCOUNTER — Other Ambulatory Visit: Payer: Self-pay

## 2023-07-03 VITALS — Ht 63.0 in | Wt 278.0 lb

## 2023-07-03 DIAGNOSIS — E119 Type 2 diabetes mellitus without complications: Secondary | ICD-10-CM

## 2023-07-03 HISTORY — DX: Presence of cardiac pacemaker: Z95.0

## 2023-07-03 NOTE — Patient Instructions (Addendum)
Your procedure is scheduled on: Wednesday 07/11/23 To find out your arrival time, please call [phone] between 1PM - 3PM on:  Tuesday  07/10/23 Report to the Registration Desk on the 1st floor of the [facility]. Free Valet parking is available.  If your arrival time is 6:00 am, do not arrive before that time as the [facility] entrance doors do not open until 6:00 am.  REMEMBER: Instructions that are not followed completely may result in serious medical risk, up to and including death; or upon the discretion of your surgeon and anesthesiologist your surgery may need to be rescheduled.  Do not eat food after midnight the night before surgery.  No gum chewing or hard candies.  You may however, drink CLEAR liquids up to 2 hours before you are scheduled to arrive for your surgery. Do not drink anything within 2 hours of your scheduled arrival time.  Clear liquids include: - water   One week prior to surgery: Stop Anti-inflammatories (NSAIDS) such as Advil, Aleve, Ibuprofen, Motrin, Naproxen, Naprosyn and Aspirin based products such as Excedrin, Goody's Powder, BC Powder. You may however, continue to take Tylenol if needed for pain up until the day of surgery.  Stop ANY OVER THE COUNTER supplements and vitamins until after surgery.  Continue taking all prescribed medications with the exception of the following: Xarelto, last dose will be on Sunday 07/08/23  TAKE ONLY THESE MEDICATIONS THE MORNING OF SURGERY WITH A SIP OF WATER:  metoprolol succinate (TOPROL-XL) 12.5 MG  citalopram (CELEXA) 10 MG   No Alcohol for 24 hours before or after surgery.  No Smoking including e-cigarettes for 24 hours before surgery.  No chewable tobacco products for at least 6 hours before surgery.  No nicotine patches on the day of surgery.  Do not use any "recreational" drugs for at least a week (preferably 2 weeks) before your surgery.  Please be advised that the combination of cocaine and  anesthesia may have negative outcomes, up to and including death. If you test positive for cocaine, your surgery will be cancelled.  On the morning of surgery brush your teeth with toothpaste and water, you may rinse your mouth with mouthwash if you wish. Do not swallow any toothpaste or mouthwash.  Use CHG Soap or wipes as directed on instruction sheet. You can use your Hibiclens  Do not wear lotions, powders, or perfumes. No Deodorant  Do not shave body hair from the neck down 48 hours before surgery.  Wear comfortable clothing (specific to your surgery type) to the hospital.  Do not wear jewelry, make-up, hairpins, clips or nail polish.  For welded (permanent) jewelry: bracelets, anklets, waist bands, etc.  Please have this removed prior to surgery.  If it is not removed, there is a chance that hospital personnel will need to cut it off on the day of surgery. Contact lenses, hearing aids and dentures may not be worn into surgery.  Do not bring valuables to the hospital. [facility] is not responsible for any missing/lost belongings or valuables.   Notify your doctor if there is any change in your medical condition (cold, fever, infection).  If you are being discharged the day of surgery, you will not be allowed to drive home. You will need a responsible individual to drive you home and stay with you for 24 hours after surgery.   If you are taking public transportation, you will need to have a responsible individual with you.  If you are being admitted  to the hospital overnight, leave your suitcase in the car. After surgery it may be brought to your room.  In case of increased patient census, it may be necessary for you, the patient, to continue your postoperative care in the Same Day Surgery department.  After surgery, you can help prevent lung complications by doing breathing exercises.  Take deep breaths and cough every 1-2 hours. Your doctor may order a device called an  Incentive Spirometer to help you take deep breaths. When coughing or sneezing, hold a pillow firmly against your incision with both hands. This is called "splinting." Doing this helps protect your incision. It also decreases belly discomfort.  Surgery Visitation Policy:  Patients undergoing a surgery or procedure may have two family members or support persons with them as long as the person is not COVID-19 positive or experiencing its symptoms.   Inpatient Visitation:    Visiting hours are 7 a.m. to 8 p.m. Up to four visitors are allowed at one time in a patient room. The visitors may rotate out with other people during the day. One designated support person (adult) may remain overnight.  Due to an increase in RSV and influenza rates and associated hospitalizations, children ages 27 and under will not be able to visit patients in [facility]. Masks continue to be strongly recommended.  Please call the Pre-admissions Testing Dept. at [phone] if you have any questions about these instructions.     Preparing for Surgery with CHLORHEXIDINE GLUCONATE (CHG) Soap  Chlorhexidine Gluconate (CHG) Soap  o An antiseptic cleaner that kills germs and bonds with the skin to continue killing germs even after washing  o Used for showering the night before surgery and morning of surgery  Before surgery, you can play an important role by reducing the number of germs on your skin.  CHG (Chlorhexidine gluconate) soap is an antiseptic cleanser which kills germs and bonds with the skin to continue killing germs even after washing.  Please do not use if you have an allergy to CHG or antibacterial soaps. If your skin becomes reddened/irritated stop using the CHG.  1. Shower the NIGHT BEFORE SURGERY and the MORNING OF SURGERY with CHG soap.  2. If you choose to wash your hair, wash your hair first as usual with your normal shampoo.  3. After shampooing, rinse your hair and body thoroughly  to remove the shampoo.  4. Use CHG as you would any other liquid soap. You can apply CHG directly to the skin and wash gently with a scrungie or a clean washcloth.  5. Apply the CHG soap to your body only from the neck down. Do not use on open wounds or open sores. Avoid contact with your eyes, ears, mouth, and genitals (private parts). Wash face and genitals (private parts) with your normal soap.  6. Wash thoroughly, paying special attention to the area where your surgery will be performed.  7. Thoroughly rinse your body with warm water.  8. Do not shower/wash with your normal soap after using and rinsing off the CHG soap.  9. Pat yourself dry with a clean towel.  10. Wear clean pajamas to bed the night before surgery.  12. Place clean sheets on your bed the night of your first shower and do not sleep with pets.  13. Shower again with the CHG soap on the day of surgery prior to arriving at the hospital.  14. Do not apply any deodorants/lotions/powders.  15. Please wear clean clothes to  the hospital.

## 2023-07-04 ENCOUNTER — Ambulatory Visit
Admission: RE | Admit: 2023-07-04 | Discharge: 2023-07-04 | Disposition: A | Discharge status: Home / Self Care | Payer: Medicare HMO | Source: Ambulatory Visit | Attending: General Surgery | Admitting: General Surgery

## 2023-07-04 DIAGNOSIS — Z171 Estrogen receptor negative status [ER-]: Secondary | ICD-10-CM | POA: Diagnosis present

## 2023-07-04 DIAGNOSIS — C50412 Malignant neoplasm of upper-outer quadrant of left female breast: Secondary | ICD-10-CM | POA: Insufficient documentation

## 2023-07-04 DIAGNOSIS — D241 Benign neoplasm of right breast: Secondary | ICD-10-CM

## 2023-07-04 HISTORY — PX: BREAST BIOPSY: SHX20

## 2023-07-04 MED ORDER — LIDOCAINE HCL 1 % IJ SOLN
10.0000 mL | Freq: Once | INTRAMUSCULAR | Status: AC
  Administered 2023-07-04: 10 mL
  Filled 2023-07-04: qty 10

## 2023-07-04 MED ORDER — LIDOCAINE HCL 1 % IJ SOLN
5.0000 mL | Freq: Once | INTRAMUSCULAR | Status: AC
  Administered 2023-07-04: 5 mL
  Filled 2023-07-04: qty 5

## 2023-07-09 ENCOUNTER — Other Ambulatory Visit: Payer: Self-pay | Admitting: General Surgery

## 2023-07-09 ENCOUNTER — Encounter: Payer: Self-pay | Admitting: General Surgery

## 2023-07-09 DIAGNOSIS — D241 Benign neoplasm of right breast: Secondary | ICD-10-CM

## 2023-07-10 ENCOUNTER — Encounter: Payer: Self-pay | Admitting: General Surgery

## 2023-07-10 NOTE — Progress Notes (Signed)
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 Clinical nursing staff has updated patient's PMH/PSHx, current medication list, and drug allergies/intolerances to ensure comprehensive history available to assist in medical decision making as it pertains to the aforementioned surgical procedure and anticipated anesthetic course. Extensive review of available clinical information personally performed. Shelly PMH and PSHx updated with any diagnoses/procedures that  may have been inadvertently omitted during his intake with the pre-admission testing department's nursing staff.  Clinical Discussion:  Jennifer Gregory is a 78 y.o. female who is submitted for pre-surgical anesthesia review and clearance prior to her undergoing the above procedure. Patient has never been a smoker in the past. Pertinent PMH includes: CAD, HFpEF, atrial fibrillation/flutter, sick sinus syndrome (s/p PPM placement), pulmonary hypertension, ascending aorta dilatation, cardiomegaly, aortic atherosclerosis, HTN, HLD, T2DM, subclinical hyperthyroidism, restrictive lung disease, OSAH (no nocturnal PAP therapy), antineoplastic chemotherapy-induced anemia, cirrhosis, neurogenic bladder, medical noncompliance,  BILATERAL lower extremity edema, anxiety.  Patient is followed by cardiology Darin Engels, MD). She was last seen in the cardiology clinic on 06/18/2023; notes reviewed. At the time of her clinic visit, patient reporting fatigue related to recent chemotherapy.  Patient with stable orthopnea.  No reported recurrent chest pain. Patient denied any shortness of breath, PND, palpitations, significant peripheral edema, weakness, fatigue, vertiginous symptoms, or presyncope/syncope. Patient with a past medical history significant for cardiovascular diagnoses. Documented physical exam was grossly benign, providing no evidence of acute exacerbation and/or decompensation of the patient's known cardiovascular conditions.  Patient with a history of sinoatrial node dysfunction/sick sinus syndrome for which she required placement of a permanent pacemaker (St. Jude CRM Zephyr XL DR) back in 06/2008.  Device reached EOL in 2017, thus requiring explantation and generator change out.  Patient underwent placement of a St. Jude CRM Assurity MRI (SN: [id number]) device on 03/28/2016. Device is regularly interrogated by patient's primary electrophysiology team.  Last interrogation was on 04/18/2023, which indicated that device was functioning properly.  Patient with an atrial fibrillation diagnosis.  She underwent DCCV procedure on 08/19/2013, at which time she received 200 J synchronized cardioversions x 2.  Both cardioversions briefly restored NSR, however patient with early refractory atrial fibrillation/flutter (ERAF).   Second DCCV procedure was performed on 09/08/2013, at which time patient received a single 300 J synchronized cardioversion, which restored NSR.  Myocardial PET scan was performed on 03/21/2022 revealing normal left ventricular systolic function, wall motion, wall thickening, and filling volumes.  There was no evidence of significant ischemia.  Severe coronary artery calcifications and aortic atherosclerosis were  observed.  Patient refused stress testing, as she denied chest pain at the time of the study.  Stress echocardiogram was performed on 04/17/2022 revealing normal left ventricular systolic function with mild LVH.  GLS -14.7%.  Left atrium severely, and right atrium mildly, enlarged. There was trivial  to mild mitral, tricuspid, and pulmonary valve regurgitation.  All transvalvular gradients were noted to be normal providing no evidence suggestive of valvular stenosis.  Stress duration was 7.8 minutes, during which time patient achieved a maximum heart rate of 136 bpm; MPHR 121 bpm.  During stress, there was horizontal downsloping ST depression in leads V4-V6.  Study determined to be abnormal.  Most recent TTE was performed on 06/05/2023 revealing a normal left ventricular systolic function with an EF of 55%.  There was mild concentric LVH.  Diastolic Doppler parameters indeterminant.  Right ventricular size and function normal there was trivial to mild pan valvular regurgitation.  RVSP elevated at 49 mmHg consistent with patient's known pulmonary hypertension. All transvalvular gradients were noted to be normal providing no evidence suggestive of valvular stenosis. Aorta normal in size with no evidence of ectasia or aneurysmal dilatation.  Patient with an atrial fibrillation diagnosis; CHA2DS2-VASc Score = 7 (age x 2, sex, HFpEF, HTN, vascular disease, T2DM). Her rate and rhythm are currently being maintained on oral metoprolol succinate. She is chronically anticoagulated using rivaroxaban; reported to be compliant with therapy with no evidence or reports of GI/GU bleeding.  Blood pressure well controlled at 108/56 mmHg on currently prescribed beta-blocker (metoprolol succinate) and diuretic (torsemide) therapies.  Patient not currently taking any type of lipid-lowering therapies for her HLD diagnosis and further ASCVD prevention.  She has a supply of short acting nitrates (NTG) to use on a as needed basis for  recurrent angina/anginal: Symptoms; denied recent use.  T2DM well-controlled on currently prescribed regimen; last HbA1c was 6.5% when checked on 07/07/2022.  Functional capacity limited by patient's age and multiple medical comorbidities.  Again, patient experiencing post antineoplastic chemotherapy fatigue.  Due to the aforementioned, patient is requiring assistance with ADLs/IADLs, however this is mainly for stability rather than cardiovascular limitation.  Per the [facility], patient able to achieve at least 4 METS of physical activity without experiencing any significant degree of angina/anginal equivalent symptoms.  No changes were made to her medication regimen.  Patient to follow-up with outpatient cardiology in 3 months or sooner if needed.  Jennifer Gregory underwent core needle biopsy on 01/15/2023, at which time biopsies were taken from areas in her BILATERAL breast.  Pathology results returned (+) for [facility] with focal squamous differentiation in the LEFT breast (G3, no LVI, ER/PR (-), HER2/neu (-) by FISH; HER2/neu 2+ by IHC).  Contralaterally, patient with fragments of epithelial proliferation with atypia.  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  Patient underwent 11 weekly cycles of carboplatin + paclitaxel chemotherapy.  Additionally, she was trialed on pembrolizumab, however treatment was truncated and ultimately discontinued due to the development of a skin rash.  Following neoadjuvant chemotherapy regimen, patient has been referred to general surgery for definitive surgical excision.  She is scheduled to undergo PARTIAL MASTECTOMY and AXILLARY SENTINEL NODE BIOPSY on 07/11/2023 with Dr. Carolan Shiver.  Given that patient has a history significant for cardiovascular diagnoses, presurgical cardiac clearance was sought by the PAT team.  Per cardiology, "she has INTERMEDIATE risk factors (compensated heart failure and hypertension) and is being considered for a  procedure of INTERMEDIATE cardiopulmonary risk. She denies any symptoms suggestive of active ischemia. She is in chronic atrial fibrillation and is on anticoagulation for this. She underwent an echocardiogram in January 25 which showed preserved LV function. Based on this, she can proceed with her surgery without additional cardiac studies".  Again, this patient is on daily oral anticoagulation therapy  using a DOAC medication. She has been instructed on recommendations for holding her rivaroxaban for 2 days prior to her procedure with plans to restart as soon as postoperative bleeding risk felt to be minimized by his attending surgeon. The patient has been instructed that her last dose of rivaroxaban should be on 07/08/2023. Per cardiology, patient will not require any type of anticoagulation bridging surrounding this procedure.  Patient reports previous perioperative complications with anesthesia in the past.  Patient advising that she experiences (+) "respiratory depression" following anesthesia; states "I need to sit up immediately".  Of note, patient has a documented history of orthopnea. In review her EMR, it is noted that patient underwent a general anesthetic course here at [facility] in 01/2023 without documented complications.      07/03/2023    9:58 AM 06/15/2023    8:42 AM 05/13/2023    7:39 AM  Vitals with BMI  Height 5\' 3"     Weight 278 lbs 265 lbs   BMI 49.26    Systolic  122 117  Diastolic  89 52  Pulse  77 78   Providers/Specialists:  NOTE: Primary physician provider listed below. Patient may have been seen by APP or partner within same practice.   PROVIDER ROLE / SPECIALTY LAST Beverely Pace, MD General Surgery (Surgeon) 06/26/2023  Rayetta Humphrey, MD Primary Care Provider 01/02/2023  Shon Hale, MD Cardiology 06/18/2023  Owens Shark, MD Medical Oncology 06/15/2023   Allergies:   Allergies  Allergen  Reactions   Aspirin Other (See Comments)    Bleeding risk since patient is on xarelto   Ciprofloxacin Itching and Rash        Codeine    Diazepam Nausea And Vomiting   Dofetilide Other (See Comments)    Other Reaction: arrhythmia    Hydrocodone-Acetaminophen Nausea And Vomiting   Morphine Nausea And Vomiting   Niacin Other (See Comments)    fatigue   Oxycodone-Acetaminophen Nausea And Vomiting   Pembrolizumab Rash   Sotalol Other (See Comments)    Other Reaction: Prolonged QTc    Amiodarone Other (See Comments)    Affecting liver    Pyridoxine Other (See Comments)    Splitting headache   Sulfamethoxazole-Trimethoprim Swelling   Other Rash    EKG sticker electrodes extended time   Current Home Medications:   No current facility-administered medications for this encounter.    citalopram (CELEXA) 10 MG tablet   metoprolol succinate (TOPROL-XL) 25 MG 24 hr tablet   potassium chloride (KLOR-CON) 10 MEQ tablet   rivaroxaban (XARELTO) 20 MG TABS tablet   torsemide (DEMADEX) 20 MG tablet   clobetasol cream (TEMOVATE) 0.05 %   collagenase (SANTYL) 250 UNIT/GM ointment   dexamethasone (DECADRON) 4 MG tablet   diphenoxylate-atropine (LOMOTIL) 2.5-0.025 MG tablet   feeding supplement (ENSURE ENLIVE / ENSURE PLUS) LIQD   lidocaine-prilocaine (EMLA) cream   magic mouthwash w/lidocaine SOLN   Multiple Vitamins-Minerals (CENTRUM SILVER 50+WOMEN PO)   nitroGLYCERIN (NITROSTAT) 0.4 MG SL tablet   nutrition supplement, JUVEN, (JUVEN) PACK   nystatin (MYCOSTATIN) 100000 UNIT/ML suspension   ondansetron (ZOFRAN) 4 MG tablet   prochlorperazine (COMPAZINE) 10 MG tablet   triamcinolone ointment (KENALOG) 0.5 %   History:   Past Medical History:  Diagnosis Date   (HFpEF) heart failure with preserved ejection fraction (HCC)    Antineoplastic chemotherapy induced anemia    Aortic atherosclerosis (HCC)    Ascending aorta dilatation (HCC)  Atrial fibrillation and flutter (HCC)     a.) CHA2DS2VASc = 7 (age x2, sex, HFpEF, HTN, vascular disease, T2DM) as of 07/09/2023; b.) s/p DCCV 08/19/2013 --> 200 J x 2 with brief conversion and ERAF; c.) s/p DCCV 09/08/2013: 300 J x 1 --> NSR; d.) rate/rhythm maintained on oral metoprolol succinate; chronically anticoagulated with rivaroxaban   Cardiomegaly    Cirrhosis (HCC)    Complication of anesthesia    a.) "respiratory depression" --> "I need to sit up immediately"   Coronary artery disease 04/15/2007   a.) cCTA 04/15/2007: focal mixed calcified/non-calcified plaque (<50% stenosis) pLAD, mLAD, pD1; b.) mPET 03/21/2022: severe CAD and aortic atherosclerosis, normal myocardial perfusion.   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   a.) SJM CRM Zephyr XL DR 06/26/2008 (SN: [id number]); b.) replaced 03/28/2016: STM CRM Assurity MRI (SN: [id number])   Pulmonary HTN (HCC)    a.) TTE 03/13/2012: RVSP 34; b.) TTE 08/19/2013: RVSP 13; c.) TTE 12/03/2013: RVSP 39; d.) TTE 08/20/2014: RVSP 45; e.) TTE 03/14/2016: RVSP 28; f.) TTE 03/01/2021: RVSP 44; g.) TTE 06/05/2023: RVSP 49   Restrictive lung disease    Sick sinus syndrome (HCC) 06/26/2008   a.) s/p PPM 06/26/2008; b.) replaced/upgraded 03/28/2016   Sleep apnea    a.) no nocturnal PAP therapy   Subclinical hyperthyroidism    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   a.) CNB 01/15/2023 --> pathology (+) IMC with focal  squamous differentiation (G3, no LVI, ER/PR (-), HER2/neu (-) by FISH; HER2/neu 2+ by IHC) --> stage II triple negative breast cancer; b.) s/p 11 wwekly cycles of neoadjuvant carboplatin + pacliataxel chemotherapy; could not tolerate pembrolizumab due to skin rash   Type 2 diabetes mellitus without complication (HCC)    Vitamin D deficiency    Past Surgical History:  Procedure Laterality Date   ABSCESS DRAINAGE     BREAST BIOPSY Right 01/15/2023   [place] RT BREAST BX W LOC DEV 1ST LESION IMG BX SPEC US GUIDE 01/15/2023 [facility] (Coil clip --> pathology (+) epithelial proliferation with atypia --> DDx includes an intraductal papilloma with at least ADH)   BREAST BIOPSY Left 01/15/2023   [place] LT BREAST BX W LOC DEV 1ST LESION IMG BX SPEC US GUIDE 01/15/2023 [facility] (Ribbon clip --> pathology (+) IMC with focal squamous differentiation)   BREAST BIOPSY Right 07/04/2023   MM RT RADIO FREQUENCY TAG LOC MAMMO GUIDE 07/04/2023 [facility]   BREAST BIOPSY Left 07/04/2023   [place] LT RADIO FREQUENCY TAG EA ADD LESION LOC US GUIDE 07/04/2023 [facility]   CARDIAC PACEMAKER PLACEMENT  06/26/2008   St. Jude dual chamber   CARDIOVERSION N/A 09/08/2013   CESAREAN SECTION     x 3   CHOLECYSTECTOMY     PACEMAKER GENERATOR CHANGE  03/28/2016   PORTACATH PLACEMENT Right 01/31/2023   Procedure: INSERTION PORT-A-CATH;  Surgeon: Carolan Shiver, MD;  Location[facility]  ORS;  Service: General;  Laterality: Right;   TEE WITH CARDIOVERSION N/A 08/19/2013   TOTAL ABDOMINAL HYSTERECTOMY     TUBAL LIGATION     Family History  Problem Relation Age of Onset   Mesothelioma Father    Social History   Tobacco Use   Smoking status: Never   Smokeless tobacco: Never  Substance Use Topics   Alcohol use: Not Currently   Pertinent Clinical Results:  LABS:  Appointment on 06/15/2023  Component Date Value Ref Range Status   Folate 06/15/2023 10.9  >5.9 ng/mL Final   Performed at [facility], [address] [place], [place] [address]   Vitamin B-12 06/15/2023 543  180 - 914 pg/mL Final   Comment: (NOTE) This assay is not validated for testing neonatal or myeloproliferative syndrome specimens for Vitamin B12 levels. Performed at [facility], [address] [place], [place] [address]    Iron 06/15/2023 88  28 - 170 ug/dL Final   TIBC 09/81/1914 248 (L)  250 - 450 ug/dL Final   Saturation Ratios 06/15/2023 36 (H)  10.4 - 31.8 % Final   UIBC 06/15/2023 160  ug/dL Final   Performed at [facility], [address] [place], [place] [address]   Ferritin 06/15/2023 161  11 - 307 ng/mL Final   Performed at [facility], [address] [place], [place] [address]   Sodium 06/15/2023 137  135 - 145 mmol/L Final   Potassium 06/15/2023 4.3  3.5 - 5.1 mmol/L Final   Chloride 06/15/2023 105  98 - 111 mmol/L Final   CO2 06/15/2023 23  22 - 32 mmol/L Final   Glucose, Bld 06/15/2023 102 (H)  70 - 99 mg/dL Final   Glucose reference range applies only to samples taken after fasting for at least 8 hours.   BUN 06/15/2023 11  8 - 23 mg/dL Final   Creatinine 86/57/8469 0.59  0.44 - 1.00 mg/dL Final   Calcium 62/95/2841 8.6 (L)  8.9 - 10.3 mg/dL Final   Total Protein 32/44/0102 7.0  6.5 - 8.1 g/dL Final   Albumin 72/53/6644 2.7 (L)  3.5 - 5.0 g/dL Final   AST 03/47/4259 17  15 - 41 U/L Final   ALT 06/15/2023 9  0 - 44 U/L Final   Alkaline Phosphatase 06/15/2023 70  38 - 126 U/L Final   Total Bilirubin 06/15/2023 1.4 (H)  0.0 - 1.2 mg/dL Final   GFR, Estimated 06/15/2023 >60  >60 mL/min Final   Comment: (NOTE) Calculated using the CKD-EPI Creatinine Equation (2021)    Anion gap 06/15/2023 9  5 - 15 Final   Performed at [facility], [address] [place], [place] [address]   WBC 06/15/2023 5.7  4.0 - 10.5 K/uL Final   RBC 06/15/2023 3.43 (L)  3.87 - 5.11 MIL/uL Final   Hemoglobin 06/15/2023 9.6 (L)  12.0 - 15.0 g/dL Final   HCT 56/43/3295 33.5 (L)   36.0 - 46.0 % Final   MCV 06/15/2023 97.7  80.0 - 100.0 fL Final   MCH 06/15/2023 28.0  26.0 - 34.0 pg Final   MCHC 06/15/2023 28.7 (L)  30.0 - 36.0 g/dL Final   RDW 18/84/1660 18.0 (H)  11.5 - 15.5 % Final   Platelets 06/15/2023 159  150 - 400 K/uL Final   nRBC 06/15/2023 0.0  0.0 - 0.2 % Final   Neutrophils Relative % 06/15/2023 69  % Final   Neutro Abs 06/15/2023 3.9  1.7 - 7.7 K/uL Final   Lymphocytes Relative 06/15/2023 19  % Final   Lymphs Abs 06/15/2023 1.1  0.7 - 4.0 K/uL Final   Monocytes Relative 06/15/2023 9  % Final   Monocytes Absolute 06/15/2023 0.5  0.1 - 1.0 K/uL Final   Eosinophils Relative 06/15/2023 2  % Final   Eosinophils Absolute 06/15/2023 0.1  0.0 - 0.5 K/uL Final   Basophils Relative 06/15/2023 1  % Final   Basophils Absolute 06/15/2023 0.0  0.0 - 0.1 K/uL Final   Immature Granulocytes 06/15/2023 0  % Final   Abs Immature Granulocytes 06/15/2023 0.02  0.00 - 0.07 K/uL Final   Performed at [facility], [address] [place], [place] [address]    ECG: Date: 12/18/2022  Time ECG obtained: 1029 AM Rate: 90 bpm Rhythm: atrial fibrillation Axis (leads I and aVF): normal Intervals:  QRS 88 ms. QTc 464 ms. ST segment and T wave changes: Nonspecific T wave abnormalities.  Areas of anterolateral ST depression.   Evidence of a possible, age undetermined, prior infarct:  No Comparison: Similar to previous tracing obtained on 06/07/2021 NOTE: Tracing obtained at [facility]; unable for review. Above based on cardiologist's interpretation.   IMAGING / PROCEDURES: TRANSTHORACIC ECHOCARDIOGRAM performed on 06/05/2023 Normal left ventricular systolic function with an EF of 55%. Mild LVH No regional wall motion abnormalities Left ventricular diastolic Doppler parameters indeterminant GLS -14.7% Right ventricular size and function normal Trivial AR and PR Mild MR and TR RVSP = 49 mmHg  CT CHEST WO CONTRAST performed on 05/11/2023 No acute intrathoracic  pathology.  Mild to moderate cardiomegaly. Cirrhosis. Aortic atherosclerosis  NM PET IMAGE INITIAL (PI) SKULL BASE TO THIGH performed on 01/30/2023 Hypermetabolic left breast nodules, compatible with breast cancer. Left axillary, low internal jugular and mediastinal lymph nodes do not show metabolism above blood pool. Recommend attention on follow-up. Cirrhosis. Aortic atherosclerosis  Coronary artery calcification. Enlarged pulmonic trunk, indicative of pulmonary arterial hypertension.  PET MYOCARDIAL PERFUSION SINGLE WITH CONCURRENT CT RUBIDIUM RB 82 - [id number] performed on 03/21/2022 Rest myocardial perfusion is normal Rest Global myocardial blood flow is normal Normal rest left ventricular systolic function, wall motion, wall thickening, and volumes Patient refused stress testing.  Denied chest pain.  Impression and Plan:  Arieonna Medine has been referred for pre-anesthesia review and clearance prior to her undergoing the planned anesthetic and procedural courses. Available labs, pertinent testing, and imaging results were personally reviewed by me in preparation for upcoming operative/procedural course. [facility] medical record has been updated following extensive record review and patient interview with PAT staff.   This patient has been appropriately cleared by cardiology with an overall MODERATE risk of experiencing significant perioperative cardiovascular complications. Completed perioperative prescription for cardiac device management documentation completed by primary cardiology team and placed on patient's chart for review by the surgical/anesthetic team on the day of her procedure. Electrophysiology indicating that procedure should not interfere with planned surgical procedure. Beyond normal perioperative cardiovascular monitoring, there are no recommendations from electrophysiology team that prompt further discussion/recommendations from industry representative.   Based on clinical  review performed today (07/10/23), barring any significant acute changes in the patient's overall condition, it is anticipated that she will be able to proceed with the planned surgical intervention. Any acute changes in clinical condition may necessitate her procedure being postponed and/or cancelled. Patient will meet with anesthesia team (MD and/or CRNA) on the day of her procedure for preoperative evaluation/assessment. Questions regarding anesthetic course will be fielded at  that time.   Pre-surgical instructions were reviewed with the patient during his PAT appointment, and questions were fielded to satisfaction by PAT clinical staff. She has been instructed on which medications that she will need to hold prior to surgery, as well as the ones that have been deemed safe/appropriate to take on the day of his procedure. As part of the general education provided by PAT, patient made aware both verbally and in writing, that she would need to abstain from the use of any illegal substances during his perioperative course. She was advised that failure to follow the provided instructions could necessitate case cancellation or result in serious perioperative complications up to and including death. Patient encouraged to contact PAT and/or her surgeon's office to discuss any questions or concerns that may arise prior to surgery; verbalized understanding.   Quentin Mulling, MSN, APRN, FNP-C, CEN [facility]  Perioperative Services Nurse Practitioner Phone: [phone] Fax: [phone] 07/10/23 3:03 PM  NOTE: This note has been prepared using Dragon dictation software. Despite my best ability to proofread, there is always the potential that unintentional transcriptional errors may still occur from this process.

## 2023-07-11 ENCOUNTER — Ambulatory Visit: Admission: RE | Admit: 2023-07-11 | Payer: Medicare HMO | Source: Ambulatory Visit

## 2023-07-11 ENCOUNTER — Ambulatory Visit
Admission: RE | Admit: 2023-07-11 | Discharge: 2023-07-11 | Disposition: A | Discharge status: Home / Self Care | Payer: Medicare HMO | Source: Ambulatory Visit | Attending: General Surgery | Admitting: General Surgery

## 2023-07-11 ENCOUNTER — Ambulatory Visit: Payer: Medicare HMO

## 2023-07-11 DIAGNOSIS — C50412 Malignant neoplasm of upper-outer quadrant of left female breast: Secondary | ICD-10-CM

## 2023-07-11 DIAGNOSIS — D241 Benign neoplasm of right breast: Secondary | ICD-10-CM

## 2023-07-11 NOTE — Anesthesia Preprocedure Evaluation (Addendum)
 Anesthesia Evaluation  Patient identified by MRN, date of birth, ID band Patient awake  General Assessment Comment:  Hx of aspiration post-op requiring ICU stay.  Reviewed: Allergy & Precautions, NPO status , Patient's Chart, lab work & pertinent test results  History of Anesthesia Complications (+) history of anesthetic complications (aspiration in PACU requring ICU course)  Airway Mallampati: III  TM Distance: <3 FB Neck ROM: Full    Dental  (+) Poor Dentition, Missing, Chipped, Loose,    Pulmonary shortness of breath, at rest and lying, sleep apnea , neg COPD, Patient abstained from smoking.Not current smoker  No crackles heard   + decreased breath sounds      Cardiovascular Exercise Tolerance: Poor METS: 3 - Mets hypertension, Pt. on medications + CAD, +CHF and + Orthopnea  (-) Past MI + dysrhythmias (a fib on Xarelto) Atrial Fibrillation + pacemaker (SSS)  Rhythm:Irregular Rate:Normal  NYHA Class III heart failure. PPM for SSS. Hx afib.  Echo 06/05/23:  NORMAL LEFT VENTRICULAR SYSTOLIC FUNCTION WITH MILD LVH  ESTIMATED EF: 55%, CALC EF(3D): 50%  INDETERMINATE DIASTOLIC FUNCTION  NORMAL RIGHT VENTRICULAR SYSTOLIC FUNCTION  VALVULAR REGURGITATION: TRIVIAL AR, MILD MR, TRIVIAL PR, MILD TR  NO VALVULAR STENOSIS   Myocardial perfusion 03/21/22:   1. Rest myocardial perfusion is normal.   2. Rest global myocardial blood flow is normal.   3. Normal rest left ventricular systolic function, wall motion, wall thickening, and volumes.   Patient refused stress testing. Denies CP.    Neuro/Psych  PSYCHIATRIC DISORDERS Anxiety     negative neurological ROS     GI/Hepatic ,GERD  ,,(+)     (-) substance abuse    Endo/Other  diabetes, Type 2  Class 3 obesityClass 3 obesity  Renal/GU negative Renal ROS     Musculoskeletal   Abdominal  (+) + obese  Peds  Hematology Breast CA   Anesthesia Other Findings Cardiology note  06/18/23:  A/P: 78 y.o. female who comes to clinic today for management of congestive heart failure  1. Chronic HFpEF- Patient currently endorses NYHA Class III symptoms she appears largely euvolemic and well-perfused on exam. Continue on low dose metoprolol tartrate, SGLT2 inhibitor considered but has a history of recurrent UTIs. Additionally, she did not tolerate the addition of spironolactone in the past. I do not plan to make any changes to her regimen.  2. Afib- She was noted to be in atrial fibrillation permanently with intermittent rapid episodes on her last device interrogation. She remains on rivaroxaban for embolic prophylaxis and metoprolol for rate control. Rates well controlled today.   3. HTN-blood pressure is well-controlled in clinic.  4. CAD-nonobstructive on last cardiac catheterization. Stress echo in 2023 with positive EKG, however with no exercise-induced wall motion abnormalities. No further workup  5. Hyperlipidemia-her prior lipid panels show an LDL that is not at target (>100). She has prior intolerances to a statin. She was initiated on niacin by Jennifer Gregory, but Jennifer Gregory discontinued this due to symptoms of fatigue which she ascribed to a medication side effect. Refused referral to lipid clinic at last visit, can re-engage with this discussion at next visit.   6. Preoperative clearance-patient is being evaluated for mastectomy. She has intermediate risk factors (compensated heart failure and hypertension) and is being considered for a procedure of intermediate cardiopulmonary risk. She denies any symptoms suggestive of active ischemia. She is in chronic atrial fibrillation and is on anticoagulation for this. She underwent an echocardiogram in January 25 which showed preserved  LV function. Based on this, she can proceed with her surgery without additional cardiac studies. I would recommend that her oral anticoagulation be held 3 days prior to her surgery and resumed once  hemostasis is achieved. She would not require an anticoagulation bridge during this time.  7. Follow-up-3 months    Reproductive/Obstetrics                             Anesthesia Physical Anesthesia Plan  ASA: 3  Anesthesia Plan: General   Post-op Pain Management: Ofirmev IV (intra-op)*   Induction: Intravenous  PONV Risk Score and Plan: 3 and Ondansetron, Dexamethasone and Treatment may vary due to age or medical condition  Airway Management Planned: Oral ETT and Video Laryngoscope Planned  Additional Equipment:   Intra-op Plan:   Post-operative Plan: Extubation in OR  Informed Consent: I have reviewed the patients History and Physical, chart, labs and discussed the procedure including the risks, benefits and alternatives for the proposed anesthesia with the patient or authorized representative who has indicated his/her understanding and acceptance.     Dental advisory given  Plan Discussed with: CRNA  Anesthesia Plan Comments: (Plan to use magnet in sterile ultrasound sleeve over the pacemaker on surgical field.  Patient consented for risks of anesthesia including but not limited to:  - adverse reactions to medications - damage to eyes, teeth, lips or other oral mucosa - nerve damage due to positioning  - sore throat or hoarseness - damage to heart, brain, nerves, lungs, other parts of body or loss of life  Informed patient about role of CRNA in peri- and intra-operative care.  Patient voiced understanding.)        Anesthesia Quick Evaluation

## 2023-07-15 ENCOUNTER — Other Ambulatory Visit: Payer: Self-pay

## 2023-07-17 MED ORDER — SODIUM CHLORIDE 0.9 % IV SOLN: INTRAVENOUS | Status: DC

## 2023-07-17 MED ORDER — CEFAZOLIN SODIUM-DEXTROSE 2-4 GM/100ML-% IV SOLN
2.0000 g | INTRAVENOUS | Status: AC
  Administered 2023-07-18: 3 g via INTRAVENOUS

## 2023-07-17 MED ORDER — CHLORHEXIDINE GLUCONATE 0.12 % MT SOLN
15.0000 mL | Freq: Once | OROMUCOSAL | Status: AC
  Administered 2023-07-18: 15 mL via OROMUCOSAL

## 2023-07-17 MED ORDER — ORAL CARE MOUTH RINSE: 15.0000 mL | Freq: Once | OROMUCOSAL | Status: AC

## 2023-07-18 ENCOUNTER — Other Ambulatory Visit: Payer: Self-pay | Admitting: General Surgery

## 2023-07-18 ENCOUNTER — Ambulatory Visit
Admission: RE | Admit: 2023-07-18 | Discharge: 2023-07-18 | Disposition: A | Discharge status: Home / Self Care | Payer: Medicare HMO | Source: Ambulatory Visit | Attending: General Surgery | Admitting: General Surgery

## 2023-07-18 ENCOUNTER — Other Ambulatory Visit: Payer: Self-pay

## 2023-07-18 ENCOUNTER — Inpatient Hospital Stay: Admission: RE | Admit: 2023-07-18 | Payer: Medicare HMO | Source: Ambulatory Visit

## 2023-07-18 ENCOUNTER — Inpatient Hospital Stay: Payer: Medicare HMO

## 2023-07-18 ENCOUNTER — Encounter: Payer: Self-pay | Admitting: General Surgery

## 2023-07-18 ENCOUNTER — Observation Stay
Admission: RE | Admit: 2023-07-18 | Discharge: 2023-07-19 | Disposition: A | Discharge status: Home / Self Care | Payer: Medicare HMO | Attending: General Surgery | Admitting: General Surgery

## 2023-07-18 ENCOUNTER — Ambulatory Visit: Payer: Medicare HMO

## 2023-07-18 ENCOUNTER — Inpatient Hospital Stay: Payer: Medicare HMO | Admitting: Oncology

## 2023-07-18 ENCOUNTER — Encounter
Admission: RE | Disposition: A | Discharge status: Home / Self Care | Payer: Self-pay | Source: Home / Self Care | Attending: General Surgery

## 2023-07-18 ENCOUNTER — Ambulatory Visit: Payer: Medicare HMO | Admitting: Urgent Care

## 2023-07-18 DIAGNOSIS — I5033 Acute on chronic diastolic (congestive) heart failure: Secondary | ICD-10-CM

## 2023-07-18 DIAGNOSIS — Z8616 Personal history of COVID-19: Secondary | ICD-10-CM | POA: Insufficient documentation

## 2023-07-18 DIAGNOSIS — Z7901 Long term (current) use of anticoagulants: Secondary | ICD-10-CM | POA: Insufficient documentation

## 2023-07-18 DIAGNOSIS — E119 Type 2 diabetes mellitus without complications: Secondary | ICD-10-CM | POA: Insufficient documentation

## 2023-07-18 DIAGNOSIS — C50412 Malignant neoplasm of upper-outer quadrant of left female breast: Secondary | ICD-10-CM | POA: Diagnosis present

## 2023-07-18 DIAGNOSIS — Z171 Estrogen receptor negative status [ER-]: Secondary | ICD-10-CM | POA: Insufficient documentation

## 2023-07-18 DIAGNOSIS — Z950 Presence of cardiac pacemaker: Secondary | ICD-10-CM | POA: Diagnosis not present

## 2023-07-18 DIAGNOSIS — R0902 Hypoxemia: Secondary | ICD-10-CM | POA: Diagnosis not present

## 2023-07-18 DIAGNOSIS — D241 Benign neoplasm of right breast: Secondary | ICD-10-CM

## 2023-07-18 DIAGNOSIS — I110 Hypertensive heart disease with heart failure: Secondary | ICD-10-CM | POA: Diagnosis not present

## 2023-07-18 DIAGNOSIS — I2510 Atherosclerotic heart disease of native coronary artery without angina pectoris: Secondary | ICD-10-CM | POA: Diagnosis not present

## 2023-07-18 DIAGNOSIS — I5030 Unspecified diastolic (congestive) heart failure: Secondary | ICD-10-CM | POA: Insufficient documentation

## 2023-07-18 DIAGNOSIS — I4891 Unspecified atrial fibrillation: Secondary | ICD-10-CM | POA: Insufficient documentation

## 2023-07-18 DIAGNOSIS — Z79899 Other long term (current) drug therapy: Secondary | ICD-10-CM | POA: Diagnosis not present

## 2023-07-18 DIAGNOSIS — I5032 Chronic diastolic (congestive) heart failure: Secondary | ICD-10-CM | POA: Diagnosis not present

## 2023-07-18 HISTORY — DX: Other disorders of lung: J98.4

## 2023-07-18 HISTORY — PX: BREAST BIOPSY: SHX20

## 2023-07-18 HISTORY — DX: Thyrotoxicosis, unspecified without thyrotoxic crisis or storm: E05.90

## 2023-07-18 HISTORY — DX: Unspecified cirrhosis of liver: K74.60

## 2023-07-18 HISTORY — DX: Thoracic aortic ectasia: I77.810

## 2023-07-18 HISTORY — DX: Generalized anxiety disorder: F41.1

## 2023-07-18 HISTORY — DX: Pulmonary hypertension, unspecified: I27.20

## 2023-07-18 HISTORY — DX: Patient's noncompliance with other medical treatment and regimen due to unspecified reason: Z91.199

## 2023-07-18 HISTORY — DX: Long term (current) use of anticoagulants: Z79.01

## 2023-07-18 HISTORY — PX: PART MASTECTOMY,RADIO FREQUENCY LOCALIZER,AXILLARY SENTINEL NODE BIOPSY: SHX6901

## 2023-07-18 HISTORY — DX: Atherosclerosis of aorta: I70.0

## 2023-07-18 HISTORY — DX: Anemia due to antineoplastic chemotherapy: D64.81

## 2023-07-18 HISTORY — DX: Cardiomegaly: I51.7

## 2023-07-18 HISTORY — DX: Neuromuscular dysfunction of bladder, unspecified: N31.9

## 2023-07-18 HISTORY — DX: Unspecified atrial fibrillation: I48.91

## 2023-07-18 HISTORY — DX: Unspecified diastolic (congestive) heart failure: I50.30

## 2023-07-18 LAB — GLUCOSE, CAPILLARY
Glucose-Capillary: 99 mg/dL (ref 70–99)
Glucose-Capillary: 141 mg/dL — ABNORMAL HIGH (ref 70–99)

## 2023-07-18 LAB — TROPONIN I (HIGH SENSITIVITY)
Troponin I (High Sensitivity): 11 ng/L (ref <18)
Troponin I (High Sensitivity): 10 ng/L (ref <18)

## 2023-07-18 LAB — BRAIN NATRIURETIC PEPTIDE: B Natriuretic Peptide: 430.1 pg/mL — ABNORMAL HIGH (ref 0.0–100.0)

## 2023-07-18 SURGERY — PART MASTECTOMY,RADIO FREQUENCY LOCALIZER,AXILLARY SENTINEL NODE BIOPSY
Anesthesia: General | Site: Breast | Laterality: Bilateral

## 2023-07-18 MED ORDER — LACTATED RINGERS IV SOLN: INTRAVENOUS | Status: DC | PRN

## 2023-07-18 MED ORDER — BUPIVACAINE-EPINEPHRINE (PF) 0.5% -1:200000 IJ SOLN
INTRAMUSCULAR | Status: DC | PRN
  Administered 2023-07-18: 30 mL

## 2023-07-18 MED ORDER — PHENYLEPHRINE 80 MCG/ML (10ML) SYRINGE FOR IV PUSH (FOR BLOOD PRESSURE SUPPORT)
PREFILLED_SYRINGE | INTRAVENOUS | Status: DC | PRN
  Administered 2023-07-18: 160 ug via INTRAVENOUS
  Administered 2023-07-18 (×2): 80 ug via INTRAVENOUS
  Administered 2023-07-18 (×2): 160 ug via INTRAVENOUS
  Administered 2023-07-18: 80 ug via INTRAVENOUS

## 2023-07-18 MED ORDER — MORPHINE SULFATE (PF) 2 MG/ML IV SOLN: 2.0000 mg | INTRAVENOUS | Status: DC | PRN

## 2023-07-18 MED ORDER — HYDROCODONE-ACETAMINOPHEN 5-325 MG PO TABS: 1.0000 | ORAL_TABLET | Freq: Four times a day (QID) | ORAL | 0 refills | Status: AC | PRN

## 2023-07-18 MED ORDER — ACETAMINOPHEN 10 MG/ML IV SOLN: 1000.0000 mg | Freq: Once | INTRAVENOUS | Status: DC | PRN

## 2023-07-18 MED ORDER — PROPOFOL 10 MG/ML IV BOLUS
INTRAVENOUS | Status: DC | PRN
  Administered 2023-07-18: 80 mg via INTRAVENOUS

## 2023-07-18 MED ORDER — KETAMINE HCL 50 MG/5ML IJ SOSY
PREFILLED_SYRINGE | INTRAMUSCULAR | Status: DC | PRN
  Administered 2023-07-18: 5 mg via INTRAVENOUS
  Administered 2023-07-18: 20 mg via INTRAVENOUS

## 2023-07-18 MED ORDER — FUROSEMIDE 10 MG/ML IJ SOLN
60.0000 mg | Freq: Four times a day (QID) | INTRAMUSCULAR | Status: DC
  Administered 2023-07-18 – 2023-07-19 (×2): 60 mg via INTRAVENOUS
  Filled 2023-07-18 (×2): qty 8

## 2023-07-18 MED ORDER — BUPIVACAINE-EPINEPHRINE (PF) 0.5% -1:200000 IJ SOLN
INTRAMUSCULAR | Status: AC
  Filled 2023-07-18: qty 30

## 2023-07-18 MED ORDER — METHYLENE BLUE (ANTIDOTE) 1 % IV SOLN
INTRAVENOUS | Status: AC
  Filled 2023-07-18: qty 10

## 2023-07-18 MED ORDER — ACETAMINOPHEN 325 MG PO TABS: 650.0000 mg | ORAL_TABLET | Freq: Four times a day (QID) | ORAL | Status: DC | PRN

## 2023-07-18 MED ORDER — PHENYLEPHRINE HCL-NACL 20-0.9 MG/250ML-% IV SOLN
INTRAVENOUS | Status: AC
  Filled 2023-07-18: qty 250

## 2023-07-18 MED ORDER — LIDOCAINE HCL (PF) 2 % IJ SOLN
INTRAMUSCULAR | Status: AC
  Filled 2023-07-18: qty 5

## 2023-07-18 MED ORDER — STERILE WATER FOR IRRIGATION IR SOLN
Status: DC | PRN
  Administered 2023-07-18: 50 mL

## 2023-07-18 MED ORDER — ONDANSETRON HCL 4 MG/2ML IJ SOLN
4.0000 mg | Freq: Once | INTRAMUSCULAR | Status: AC | PRN
  Administered 2023-07-18: 4 mg via INTRAVENOUS

## 2023-07-18 MED ORDER — SUCCINYLCHOLINE CHLORIDE 200 MG/10ML IV SOSY
PREFILLED_SYRINGE | INTRAVENOUS | Status: DC | PRN
  Administered 2023-07-18: 120 mg via INTRAVENOUS

## 2023-07-18 MED ORDER — DEXAMETHASONE SODIUM PHOSPHATE 10 MG/ML IJ SOLN
INTRAMUSCULAR | Status: AC
  Filled 2023-07-18: qty 1

## 2023-07-18 MED ORDER — ONDANSETRON HCL 4 MG/2ML IJ SOLN
INTRAMUSCULAR | Status: DC | PRN
  Administered 2023-07-18: 4 mg via INTRAVENOUS

## 2023-07-18 MED ORDER — SUCCINYLCHOLINE CHLORIDE 200 MG/10ML IV SOSY
PREFILLED_SYRINGE | INTRAVENOUS | Status: AC
  Filled 2023-07-18: qty 10

## 2023-07-18 MED ORDER — TECHNETIUM TC 99M TILMANOCEPT KIT
1.0000 | PACK | Freq: Once | INTRAVENOUS | Status: AC | PRN
  Administered 2023-07-18: 1.1 via INTRADERMAL

## 2023-07-18 MED ORDER — IPRATROPIUM-ALBUTEROL 0.5-2.5 (3) MG/3ML IN SOLN
RESPIRATORY_TRACT | Status: AC
  Filled 2023-07-18: qty 3

## 2023-07-18 MED ORDER — ORAL CARE MOUTH RINSE: 15.0000 mL | OROMUCOSAL | Status: DC | PRN

## 2023-07-18 MED ORDER — KETAMINE HCL 50 MG/5ML IJ SOSY
PREFILLED_SYRINGE | INTRAMUSCULAR | Status: AC
  Filled 2023-07-18: qty 5

## 2023-07-18 MED ORDER — PROPOFOL 10 MG/ML IV BOLUS
INTRAVENOUS | Status: AC
  Filled 2023-07-18: qty 20

## 2023-07-18 MED ORDER — FENTANYL CITRATE (PF) 100 MCG/2ML IJ SOLN
INTRAMUSCULAR | Status: AC
  Filled 2023-07-18: qty 2

## 2023-07-18 MED ORDER — ACETAMINOPHEN 10 MG/ML IV SOLN
INTRAVENOUS | Status: AC
  Filled 2023-07-18: qty 100

## 2023-07-18 MED ORDER — LIDOCAINE HCL (CARDIAC) PF 100 MG/5ML IV SOSY
PREFILLED_SYRINGE | INTRAVENOUS | Status: DC | PRN
  Administered 2023-07-18: 100 mg via INTRAVENOUS

## 2023-07-18 MED ORDER — TRAMADOL HCL 50 MG PO TABS: 50.0000 mg | ORAL_TABLET | Freq: Four times a day (QID) | ORAL | Status: DC | PRN

## 2023-07-18 MED ORDER — ONDANSETRON HCL 4 MG/2ML IJ SOLN
INTRAMUSCULAR | Status: AC
  Filled 2023-07-18: qty 2

## 2023-07-18 MED ORDER — IPRATROPIUM-ALBUTEROL 0.5-2.5 (3) MG/3ML IN SOLN
3.0000 mL | Freq: Once | RESPIRATORY_TRACT | Status: AC
  Administered 2023-07-18: 3 mL via RESPIRATORY_TRACT

## 2023-07-18 MED ORDER — LIDOCAINE HCL 1 % IJ SOLN
10.0000 mL | Freq: Once | INTRAMUSCULAR | Status: AC
  Administered 2023-07-18: 7 mL
  Filled 2023-07-18: qty 10

## 2023-07-18 MED ORDER — LACTATED RINGERS IV SOLN: INTRAVENOUS | Status: DC

## 2023-07-18 MED ORDER — PHENYLEPHRINE HCL-NACL 20-0.9 MG/250ML-% IV SOLN
INTRAVENOUS | Status: DC | PRN
Start: 2023-07-18 — End: 2023-07-18
  Administered 2023-07-18: 30 ug/min via INTRAVENOUS

## 2023-07-18 MED ORDER — FENTANYL CITRATE (PF) 100 MCG/2ML IJ SOLN: 25.0000 ug | INTRAMUSCULAR | Status: DC | PRN

## 2023-07-18 MED ORDER — DEXAMETHASONE SODIUM PHOSPHATE 10 MG/ML IJ SOLN
INTRAMUSCULAR | Status: DC | PRN
  Administered 2023-07-18: 5 mg via INTRAVENOUS

## 2023-07-18 MED ORDER — ACETAMINOPHEN 10 MG/ML IV SOLN
INTRAVENOUS | Status: DC | PRN
  Administered 2023-07-18: 1000 mg via INTRAVENOUS

## 2023-07-18 MED ORDER — FENTANYL CITRATE (PF) 100 MCG/2ML IJ SOLN
INTRAMUSCULAR | Status: DC | PRN
  Administered 2023-07-18 (×2): 25 ug via INTRAVENOUS
  Administered 2023-07-18: 50 ug via INTRAVENOUS

## 2023-07-18 MED ORDER — HEMOSTATIC AGENTS (NO CHARGE) OPTIME
TOPICAL | Status: DC | PRN
  Administered 2023-07-18: 1 via TOPICAL

## 2023-07-18 SURGICAL SUPPLY — 38 items
BLADE SURG 15 STRL LF DISP TIS (BLADE) ×1 IMPLANT
CHLORAPREP W/TINT 26 (MISCELLANEOUS) IMPLANT
CNTNR URN SCR LID CUP LEK RST (MISCELLANEOUS) IMPLANT
COVER PROBE GAMMA FINDER SLV (MISCELLANEOUS) ×1 IMPLANT
DERMABOND ADVANCED .7 DNX12 (GAUZE/BANDAGES/DRESSINGS) ×1 IMPLANT
DEVICE DUBIN SPECIMEN MAMMOGRA (MISCELLANEOUS) ×1 IMPLANT
DRAPE LAPAROTOMY TRNSV 106X77 (MISCELLANEOUS) ×1 IMPLANT
DRSG GAUZE FLUFF 36X18 (GAUZE/BANDAGES/DRESSINGS) IMPLANT
ELECT REM PT RETURN 9FT ADLT (ELECTROSURGICAL) ×1 IMPLANT
ELECTRODE REM PT RTRN 9FT ADLT (ELECTROSURGICAL) ×1 IMPLANT
GAUZE 4X4 16PLY ~~LOC~~+RFID DBL (SPONGE) IMPLANT
GLOVE BIO SURGEON STRL SZ 6.5 (GLOVE) ×1 IMPLANT
GLOVE BIOGEL PI IND STRL 6.5 (GLOVE) ×1 IMPLANT
GOWN STRL REUS W/ TWL LRG LVL3 (GOWN DISPOSABLE) ×2 IMPLANT
KIT MARKER MARGIN INK (KITS) IMPLANT
KIT TURNOVER KIT A (KITS) ×1 IMPLANT
LABEL OR SOLS (LABEL) ×1 IMPLANT
MANIFOLD NEPTUNE II (INSTRUMENTS) ×1 IMPLANT
MARKER MARGIN CORRECT CLIP (MARKER) IMPLANT
NDL HYPO 22X1.5 SAFETY MO (MISCELLANEOUS) ×1 IMPLANT
NDL HYPO 25X1 1.5 SAFETY (NEEDLE) IMPLANT
NEEDLE HYPO 22X1.5 SAFETY MO (MISCELLANEOUS) ×1 IMPLANT
NEEDLE HYPO 25X1 1.5 SAFETY (NEEDLE) IMPLANT
PACK BASIN MINOR ARMC (MISCELLANEOUS) ×1 IMPLANT
POWDER SURGICEL 3.0 GRAM (HEMOSTASIS) IMPLANT
RETRACTOR RING XSMALL (MISCELLANEOUS) IMPLANT
RTRCTR WOUND ALEXIS 13CM XS SH (MISCELLANEOUS) ×1 IMPLANT
SHEATH BREAST BIOPSY SKIN MKR (SHEATH) ×1 IMPLANT
SUT MNCRL 4-0 27 PS-2 XMFL (SUTURE) ×2 IMPLANT
SUT SILK 2 0 SH (SUTURE) ×1 IMPLANT
SUT VIC AB 3-0 SH 27X BRD (SUTURE) ×2 IMPLANT
SUTURE MNCRL 4-0 27XMF (SUTURE) ×2 IMPLANT
SYR 10ML LL (SYRINGE) ×1 IMPLANT
TOWEL OR 17X26 4PK STRL BLUE (TOWEL DISPOSABLE) IMPLANT
TRAP FLUID SMOKE EVACUATOR (MISCELLANEOUS) ×1 IMPLANT
TRAP NEPTUNE SPECIMEN COLLECT (MISCELLANEOUS) ×1 IMPLANT
WATER STERILE IRR 1000ML POUR (IV SOLUTION) ×1 IMPLANT
WATER STERILE IRR 500ML POUR (IV SOLUTION) ×1 IMPLANT

## 2023-07-18 NOTE — Anesthesia Procedure Notes (Signed)
 Procedure Name: Intubation Date/Time: 07/18/2023 10:33 AM  Performed by: Otho Perl, CRNAPre-anesthesia Checklist: Patient identified, Patient being monitored, Timeout performed, Emergency Drugs available and Suction available Patient Re-evaluated:Patient Re-evaluated prior to induction Oxygen Delivery Method: Circle system utilized Preoxygenation: Pre-oxygenation with 100% oxygen Induction Type: IV induction Ventilation: Mask ventilation without difficulty Laryngoscope Size: 3 and McGrath Grade View: Grade I Tube type: Oral Tube size: 6.5 mm Number of attempts: 1 Airway Equipment and Method: Stylet Placement Confirmation: ETT inserted through vocal cords under direct vision, positive ETCO2 and breath sounds checked- equal and bilateral Secured at: 19 cm Tube secured with: Tape Dental Injury: Teeth and Oropharynx as per pre-operative assessment  Comments: Poor dentition but was able to work around loose and singular teeth

## 2023-07-18 NOTE — Consult Note (Addendum)
 Initial Consultation Note   Patient: Jennifer Gregory ZOX:[id number] DOB: [birth year] PCP: Rayetta Humphrey, MD DOA: 07/18/2023 DOS: the patient was seen and examined on 07/18/2023 Primary service: Carolan Shiver, MD  Referring physician: Carolan Shiver, MD Reason for consult: hypoxia post-op  HPI Ms. Jennifer Gregory is a 78yo female who presented for outpatient lumpectomy left breast. She tolerated procedure well but had hypoxia post-op requiring up to 4L [place] with exertion to maintain saturations in mid-90s. At rest, she was stable on 2L without dyspnea.  Patient endorses having similar episodes in the past for previous surgeries that required intubation.  Had pre-op clearance with cardiology including echo 06/05/23 at [facility] as she has history of heart failure and pacemaker. EF 55% with diastolic dysfunction.   Patient states that she previously had oxygen at home after COVID infection but was discontinued some time ago. She continues to check her oxygen frequently at home and will often have high 80s readings. She was previously diagnosed with sleep apnea and is not adherent with CPAP.  She was also previously prescribed torsemide but states that she has not taken this in days or weeks and has noticed an increase to the chronic LE swelling baseline. Chronically, she has orthopnea and sleeps at an incline in a recliner.  She describes a past hospitalization when she was told to stop diuretics because of low blood pressure.   During our encounter, she does not experience conversational dyspnea but bedside nurse describes shortness of breath with only minimal exertion getting out of bed. She appears comfortable on 2L and O2 sats remain above 94% while we talk. Rales are present bilaterally and she has significant pitting edema of bilateral LEs.  She described mild tenderness at site of her surgical procedure but no other chest pain.  Chest xray positive for atelectasis vs edema.    Assessment/Plan: Her appearance and results of her workup, exam are consistent with hypervolemia likely as her CHF has been exacerbated by IV fluids and not taking any home diuretics.   Recommended admission for IV diuresis and further weaning of oxygen. With good response, she may be able to wean from oxygen and go home tomorrow. Will place orders and continue to follow.   CHF exacerbation with acute hypervolemia- recent echo on 1/25 so no need to repeat. Likely getting worse for several days prior to procedure given her worsening LE edema and nonadherence with home torsemide. Suspect the IVF given during procedure and anesthetic worsened condition.  - IV diuresis - strict I/O - BNP - troponins - incentive spirometer, flutter valve - ambulate patient as tolerated to mobilize fluid and expand lungs - BMP am to monitor renal response to diuresis - daily weights  S/p lumpectomy R breast - analgesia PRN. Patient requests tylenol.   Other chronic conditions- continue home meds  TRH will continue to follow the patient.   Review of Systems: As mentioned in the history of present illness. All other systems reviewed and are negative. Past Medical History:  Diagnosis Date   (HFpEF) heart failure with preserved ejection fraction (HCC)    Antineoplastic chemotherapy induced anemia    Aortic atherosclerosis (HCC)    Ascending aorta dilatation (HCC)    Atrial fibrillation and flutter (HCC)    a.) CHA2DS2VASc = 7 (age x2, sex, HFpEF, HTN, vascular disease, T2DM) as of 07/09/2023; b.) s/p DCCV 08/19/2013 --> 200 J x 2 with brief conversion and ERAF; c.) s/p DCCV 09/08/2013: 300 J x 1 --> NSR; d.) rate/rhythm maintained on  oral metoprolol succinate; chronically anticoagulated with rivaroxaban   Cardiomegaly    Cirrhosis (HCC)    Complication of anesthesia    a.) "respiratory depression" --> "I need to sit up immediately"; documented history of orthopnea   Coronary artery disease 04/15/2007    a.) cCTA 04/15/2007: focal mixed calcified/non-calcified plaque (<50% stenosis) pLAD, mLAD, pD1; b.) mPET 03/21/2022: severe CAD and aortic atherosclerosis, normal myocardial perfusion.   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   a.) SJM CRM Zephyr XL DR 06/26/2008 (SN: [id number]); b.) replaced 03/28/2016: STM CRM Assurity MRI (SN: [id number])   Pulmonary HTN (HCC)    a.) TTE 03/13/2012: RVSP 34; b.) TTE 08/19/2013: RVSP 13; c.) TTE 12/03/2013: RVSP 39; d.) TTE 08/20/2014: RVSP 45; e.) TTE 03/14/2016: RVSP 28; f.) TTE 03/01/2021: RVSP 44; g.) TTE 06/05/2023: RVSP 49   Restrictive lung disease    Sick sinus syndrome (HCC) 06/26/2008   a.) s/p PPM 06/26/2008; b.) replaced/upgraded 03/28/2016   Sleep apnea    a.) no nocturnal PAP therapy   Subclinical hyperthyroidism    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   a.) CNB 01/15/2023 --> pathology (+) IMC with focal squamous differentiation (G3, no LVI, ER/PR (-), HER2/neu (-) by FISH; HER2/neu 2+ by IHC) --> stage II triple negative breast cancer; b.) s/p 11 wwekly cycles of neoadjuvant carboplatin + pacliataxel chemotherapy; could not tolerate pembrolizumab due to skin rash   Type 2 diabetes mellitus without complication (HCC)    Vitamin D deficiency    Past Surgical History:  Procedure Laterality Date   ABSCESS DRAINAGE     BREAST BIOPSY Right 01/15/2023   [place] RT BREAST BX W  LOC DEV 1ST LESION IMG BX SPEC US GUIDE 01/15/2023 [facility] (Coil clip --> pathology (+) epithelial proliferation with atypia --> DDx includes an intraductal papilloma with at least ADH)   BREAST BIOPSY Left 01/15/2023   [place] LT BREAST BX W LOC DEV 1ST LESION IMG BX SPEC US GUIDE 01/15/2023 [facility] (Ribbon clip --> pathology (+) IMC with focal squamous differentiation)   BREAST BIOPSY Right 07/04/2023   MM RT RADIO FREQUENCY TAG LOC MAMMO GUIDE 07/04/2023 [facility]   BREAST BIOPSY Left 07/04/2023   [place] LT RADIO FREQUENCY TAG EA ADD LESION LOC US GUIDE 07/04/2023 [facility]   BREAST BIOPSY Right 07/18/2023   [place] RT PLC BREAST LOC DEV   1ST LESION  INC US GUIDE 07/18/2023 [facility]   CARDIAC PACEMAKER PLACEMENT  06/26/2008   St. Jude dual chamber   CARDIOVERSION N/A 09/08/2013   CESAREAN SECTION     x 3   CHOLECYSTECTOMY     PACEMAKER GENERATOR CHANGE  03/28/2016   PORTACATH PLACEMENT Right 01/31/2023   Procedure: INSERTION PORT-A-CATH;  Surgeon: Carolan Shiver, MD;  Location: [facility];  Service: General;  Laterality: Right;   TEE WITH CARDIOVERSION N/A 08/19/2013   TOTAL ABDOMINAL HYSTERECTOMY  TUBAL LIGATION     Social History:  reports that she has never smoked. She has never used smokeless tobacco. She reports that she does not currently use alcohol. She reports that she does not use drugs.  Allergies  Allergen Reactions   Aspirin Other (See Comments)    Bleeding risk since patient is on xarelto   Ciprofloxacin Itching and Rash        Codeine    Diazepam Nausea And Vomiting   Dofetilide Other (See Comments)    Other Reaction: arrhythmia    Hydrocodone-Acetaminophen Nausea And Vomiting   Morphine Nausea And Vomiting   Niacin Other (See Comments)    fatigue   Oxycodone-Acetaminophen Nausea And Vomiting   Pembrolizumab Rash   Sotalol Other (See Comments)    Other Reaction: Prolonged QTc    Amiodarone Other (See Comments)    Affecting  liver    Pyridoxine Other (See Comments)    Splitting headache   Sulfamethoxazole-Trimethoprim Swelling   Other Rash    EKG sticker electrodes extended time    Family History  Problem Relation Age of Onset   Mesothelioma Father     Prior to Admission medications   Medication Sig Start Date End Date Taking? Authorizing Provider  citalopram (CELEXA) 10 MG tablet Take 10 mg by mouth daily.   Yes [provider]  diphenoxylate-atropine (LOMOTIL) 2.5-0.025 MG tablet Take 1 tablet by mouth 4 (four) times daily as needed for diarrhea or loose stools. 03/09/23  Yes Borders, Daryl Eastern, NP  HYDROcodone-acetaminophen (NORCO/VICODIN) 5-325 MG tablet Take 1 tablet by mouth every 6 (six) hours as needed for up to 3 days. 07/18/23 07/21/23 Yes Carolan Shiver, MD  metoprolol succinate (TOPROL-XL) 25 MG 24 hr tablet Take 12.5 mg by mouth daily.   Yes [provider]  Multiple Vitamins-Minerals (CENTRUM SILVER 50+WOMEN PO) Take 1 tablet by mouth daily.   Yes [provider]  potassium chloride (KLOR-CON) 10 MEQ tablet Take 30 mEq by mouth 2 (two) times daily. 07/14/19  Yes [provider]  rivaroxaban (XARELTO) 20 MG TABS tablet Take 20 mg by mouth every evening. 05/30/19  Yes [provider]  torsemide (DEMADEX) 20 MG tablet Take 40 mg by mouth daily in the afternoon.   Yes [provider]  triamcinolone ointment (KENALOG) 0.5 % Apply 1 Application topically 2 (two) times daily as needed (itching from rash). 02/23/23  Yes Creig Hines, MD  clobetasol cream (TEMOVATE) 0.05 % Apply topically. Patient not taking: Reported on 07/03/2023 04/03/23   [provider]  collagenase (SANTYL) 250 UNIT/GM ointment Apply topically daily. Patient not taking: Reported on 07/03/2023 05/14/23   Esaw Grandchild A, DO  dexamethasone (DECADRON) 4 MG tablet Take 2 tablets daily for 2 days, start the day after chemotherapy. Take with food. Patient not taking:  Reported on 06/29/2023 01/19/23   Creig Hines, MD  feeding supplement (ENSURE ENLIVE / ENSURE PLUS) LIQD Take 237 mLs by mouth 2 (two) times daily between meals. Patient not taking: Reported on 07/03/2023 05/13/23   Pennie Banter, DO  lidocaine-prilocaine (EMLA) cream Apply to affected area once 01/19/23   Creig Hines, MD  magic mouthwash w/lidocaine SOLN Take 15 mLs by mouth 4 (four) times daily as needed for mouth pain. Suspension contains equal amounts of Maalox Extra Strength, nystatin, diphenhydramine and lidocaine. Patient not taking: Reported on 07/03/2023 05/13/23   Esaw Grandchild A, DO  nitroGLYCERIN (NITROSTAT) 0.4 MG SL tablet Place 0.4 mg under the tongue  every 5 (five) minutes as needed for chest pain. Patient not taking: Reported on 02/08/2023    [provider]  nutrition supplement, JUVEN, (JUVEN) PACK Take 1 packet by mouth 2 (two) times daily between meals. Patient not taking: Reported on 07/03/2023 05/13/23   Esaw Grandchild A, DO  nystatin (MYCOSTATIN) 100000 UNIT/ML suspension Take 5 mLs (500,000 Units total) by mouth 4 (four) times daily. Patient not taking: Reported on 07/03/2023 05/13/23   Esaw Grandchild A, DO  ondansetron (ZOFRAN) 4 MG tablet Take 1 tablet (4 mg total) by mouth daily as needed for nausea or vomiting. Patient not taking: Reported on 06/29/2023 01/31/23 01/31/24  Carolan Shiver, MD  prochlorperazine (COMPAZINE) 10 MG tablet Take 1 tablet (10 mg total) by mouth every 6 (six) hours as needed for nausea or vomiting. Patient not taking: Reported on 06/29/2023 01/19/23   Creig Hines, MD    Physical Exam: Vitals:   07/18/23 1600 07/18/23 1615 07/18/23 1622 07/18/23 1630  BP:  (!) 142/82  121/82  Pulse: 85 85 83 83  Resp:  16 18 16   Temp:      TempSrc:      SpO2: (!) 89% 94% 93% 95%  Weight:      Height:       Physical Exam Vitals and nursing note reviewed. Exam conducted with a chaperone present.  Constitutional:      General: She is  not in acute distress.    Appearance: Normal appearance. She is obese. She is not ill-appearing, toxic-appearing or diaphoretic.  Cardiovascular:     Rate and Rhythm: Normal rate and regular rhythm.     Pulses: Normal pulses.  Pulmonary:     Effort: Pulmonary effort is normal. No respiratory distress.     Breath sounds: No stridor. Rales present. No wheezing.  Chest:     Chest wall: Tenderness present.  Abdominal:     General: There is no distension.     Palpations: Abdomen is soft.     Tenderness: There is no abdominal tenderness.  Musculoskeletal:     Cervical back: Normal range of motion.     Right lower leg: Edema present.     Left lower leg: Edema present.  Skin:    General: Skin is warm and dry.     Comments: Chronic venous stasis changes of lower extremities. Thick, rough skin with pitting edema bilaterally.   Neurological:     Mental Status: She is alert.  Psychiatric:        Mood and Affect: Mood normal.        Behavior: Behavior normal.        Judgment: Judgment normal.     Data Reviewed:     Latest Ref Rng & Units 06/15/2023    8:16 AM 05/13/2023    8:48 AM 05/12/2023    2:50 AM  CBC  WBC 4.0 - 10.5 K/uL 5.7  4.4  2.7   Hemoglobin 12.0 - 15.0 g/dL 9.6  7.5  7.5   Hematocrit 36.0 - 46.0 % 33.5  24.1  23.8   Platelets 150 - 400 K/uL 159  133  128       Latest Ref Rng & Units 06/15/2023    8:16 AM 05/13/2023    8:48 AM 05/12/2023    2:50 AM  BMP  Glucose 70 - 99 mg/dL 409  811  914   BUN 8 - 23 mg/dL 11  10  8    Creatinine 0.44 - 1.00 mg/dL 7.82  0.81  0.88   Sodium 135 - 145 mmol/L 137  137  138   Potassium 3.5 - 5.1 mmol/L 4.3  3.0  3.4   Chloride 98 - 111 mmol/L 105  102  101   CO2 22 - 32 mmol/L 23  27  29    Calcium 8.9 - 10.3 mg/dL 8.6  8.1  8.2    DG Chest Port 1 View Result Date: 07/18/2023 CLINICAL DATA:  Status post mastectomy. EXAM: PORTABLE CHEST 1 VIEW COMPARISON:  May 11, 2023. FINDINGS: Stable cardiomegaly. Left-sided pacemaker is  unchanged. Right internal jugular Port-A-Cath is unchanged. Mild perihilar and basilar opacities are noted concerning for atelectasis or edema and possible associated pleural effusions. Bony thorax is unremarkable. IMPRESSION: Mild bilateral perihilar and basilar opacities are noted concerning for atelectasis or edema and possible associated pleural effusions. Electronically Signed   By: Lupita Raider M.D.   On: 07/18/2023 15:52   MM Breast Surgical Specimen Result Date: 07/18/2023 CLINICAL DATA:  Status post [facility] localized LEFT breast lumpectomy for invasive mammary carcinoma. EXAM: SPECIMEN RADIOGRAPH OF THE LEFT BREAST COMPARISON:  Previous exam(s). FINDINGS: Status post excision of the LEFT breast. The [facility] reflector and RIBBON shaped clip are present within the specimen. IMPRESSION: Specimen radiograph of the LEFT breast. Electronically Signed   By: Jacob Moores M.D.   On: 07/18/2023 13:51   NM Sentinel Node Inj-No Rpt (Breast) Result Date: 07/18/2023 Sulfur Colloid was injected by the Nuclear Medicine Technologist for sentinel lymph node localization.   MM 3D DIAGNOSTIC MAMMOGRAM UNILATERAL RIGHT BREAST Result Date: 07/18/2023 CLINICAL DATA:  78 year old female presenting for wire placement prior to excision of RIGHT papilloma with ADH. EXAM: DIAGNOSTIC RIGHT MAMMOGRAM POST PLACEMENT OF WIRE LOCALIZATION COMPARISON:  Previous exam(s). ACR Breast Density Category c: The breasts are heterogeneously dense, which may obscure small masses. FINDINGS: Mammographic images were obtained following ultrasound guided placement of a 7 cm Kopan's wire in the RIGHT breast. These demonstrate that the wire traverses the COIL shaped biopsy marking clip at the site of biopsy-proven papilloma with ADH. Notably, the thick portion of the wire is located approximately 10 mm distal to the COIL clip in the medial direction. The SAVI scout is in stable in position, proximally 17 mm inferior to the coil shaped  biopsy marking clip. IMPRESSION: Wire localization of the RIGHT breast. The wire traverses the COIL shaped biopsy marking clip, with the thick portion of the wire located approximately 10 mm distal to the clip in the medial direction. Final Assessment: Post Procedure Mammograms for [facility] placement Electronically Signed   By: Jacob Moores M.D.   On: 07/18/2023 09:57   [place] RT PLC BREAST LOC DEV   1ST LESION  INC US GUIDE Result Date: 07/18/2023 CLINICAL DATA:  78 year old female presenting for RIGHT breast wire localization prior to excision for biopsy-proven papilloma with ADH (demarcated by coil clip). EXAM: NEEDLE LOCALIZATION OF THE RIGHT BREAST WITH ULTRASOUND GUIDANCE COMPARISON:  Previous exam(s). FINDINGS: Patient presents for needle localization prior to excisional biopsy. I met with the patient and we discussed the procedure of needle localization including benefits and alternatives. We discussed the high likelihood of a successful procedure. We discussed the risks of the procedure, including infection, bleeding, tissue injury, and further surgery. Informed, written consent was given. The usual time-out protocol was performed immediately prior to the procedure. Using ultrasound guidance, sterile technique, 1% lidocaine and a 7 cm modified Kopans needle, COIL clip in the RIGHT breast 12  o'clock position 1 cm from the nipple was localized using a lateral approach. The images were marked for Dr. Hazle Quant. IMPRESSION: Needle localization of the RIGHT breast. No apparent complications. Electronically Signed   By: Jacob Moores M.D.   On: 07/18/2023 08:44     Family Communication: son at bedside  Primary team communication: spoke with attending on phone Thank you very much for involving [place] in the care of your patient.  Author: Leeroy Bock, MD 07/18/2023 5:02 PM  For on call review [email or web address].

## 2023-07-18 NOTE — Transfer of Care (Signed)
 Immediate Anesthesia Transfer of Care Note  Patient: Jennifer Gregory  Procedure(s) Performed: PART MASTECTOMY, bil RADIO FREQUENCY LOCALIZER,   Lt  AXILLARY SENTINEL NODE BIOPSY (Bilateral: Breast)  Patient Location: PACU  Anesthesia Type:General  Level of Consciousness: awake  Airway & Oxygen Therapy: Patient Spontanous Breathing and Patient connected to face mask oxygen; slight rhonchi heard bil., duoneb ordered  Post-op Assessment: Report given to RN and Post -op Vital signs reviewed and stable  Post vital signs: Reviewed and stable  Last Vitals:  Vitals Value Taken Time  BP 105/78 07/18/23 1330  Temp    Pulse 65 07/18/23 1334  Resp 27 07/18/23 1334  SpO2 89 % 07/18/23 1334  Vitals shown include unfiled device data.  Last Pain:  Vitals:   07/18/23 1002  TempSrc: Temporal         Complications: No notable events documented.

## 2023-07-18 NOTE — Discharge Instructions (Addendum)

## 2023-07-18 NOTE — Interval H&P Note (Signed)
 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  The various methods of treatment have been discussed with the patient and family. After consideration of risks, benefits and other options for treatment, the patient has consented to  Procedure(s): PART MASTECTOMY, bil RADIO FREQUENCY LOCALIZER,   Lt  AXILLARY SENTINEL NODE BIOPSY (Bilateral) as a surgical intervention.  The patient's history has been reviewed, patient examined, no change in status, stable for surgery.  I have reviewed the patient's chart and labs.  Questions were answered to the patient's satisfaction.     Carolan Shiver

## 2023-07-18 NOTE — Op Note (Signed)
 Preoperative diagnosis: Left breast carcinoma.                                           Right breast intraductal papilloma  Postoperative diagnosis: Same.   Procedure: Left radiofrequency tag-localized partial mastectomy.                       Left Axillary Sentinel Lymph node biopsy                     Right breast excisional bipsy  Anesthesia: GETA  Surgeon: Dr. Hazle Quant  Wound Classification: Clean  Indications: Patient is a 78 y.o. 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 Also with right breast intraductal papilloma and excisional biopsy indicated to rule out right breast malignancy  Findings: 1. Specimen mammography shows marker and tag on specimen 2. Pathology call refers gross examination of margins was positive on inferior and close to posterior.  3. No other palpable mass or lymph node identified.   Description of procedure: Preoperative radiofrequency tag localization under breast and needle localization on right breast was performed by radiology. In the nuclear medicine suite, the subareolar region was injected with Tc-99 sulfur colloid. Localization studies were reviewed. The patient was taken to the operating room and placed supine on the operating table, and after general anesthesia the right and left chest and axilla were prepped and draped in the usual sterile fashion. A time-out was completed verifying correct patient, procedure, site, positioning, and implant(s) and/or special equipment prior to beginning this procedure.  Starting on the left side, the side of the invasive more carcinoma, by comparing the localization studies and interrogation with [facility] device, the probable trajectory and location of the mass was visualized. A circumareolar skin incision was planned  in such a way as to minimize the amount of dissection to reach the mass.  The skin incision was made. Flaps were raised and the location of the tag was confirmed with [facility] device confirmed. A 2-0 silk figure-of-eight stay suture was placed and used for retraction. Dissection was then taken down circumferentially, taking care to include the entire localizing tag and a wide margin of grossly normal tissue. The specimen and entire localizing tag were removed. The specimen was oriented and sent to radiology with the localization studies. Confirmation was received that the inferior margin was positive and the posterior margin was close.  Reexcision of the inferior and posterior margin were performed.. The wound was irrigated. Hemostasis was checked. The wound was closed with interrupted sutures of 3-0 Vicryl and a subcuticular suture of Monocryl 3-0. No attempt was made to close the dead space.   A hand-held gamma probe was used to identify the location of the hottest spot in the axilla. An incision was made around the caudal axillary hairline. Dissection was carried down until subdermal facias was advanced. The probe was placed and again, the point of maximal count was found. Dissection continue until nodule was identified. The probe was placed in contact with the node. The node was excised in its entirety.  No additional hot spots were identified. No clinically abnormal nodes were palpated. The procedure was terminated. Hemostasis was achieved and the wound closed in layers  with deep interrupted 3-0 Vicryl and skin was closed with subcuticular suture of Monocryl 3-0.   On the right breast, by comparing the localization studies, the probable trajectory and location of the mass was visualized. A circumareolar skin incision was planned in such a way as to minimize the amount of dissection to reach the mass.  The skin incision was made. Flaps were raised and the location of the wire was confirmed. A 2-0 silk  figure-of-eight stay suture was placed and used for retraction. Dissection was then taken down circumferentially, taking care to include the entire localizing wire and a wide margin of grossly normal tissue. The specimen and entire localizing wire were removed. The specimen was oriented and sent to radiology with the localization studies.  The wound was irrigated. Hemostasis was checked. The wound was closed with interrupted sutures of 3-0 Vicryl and a subcuticular suture of Monocryl 3-0. No attempt was made to close the dead space.   The patient tolerated the procedure well and was taken to the postanesthesia care unit in stable condition.   Sentinel Node Biopsy Synoptic Operative Report  Operation performed with curative intent:Yes  Tracer(s) used to identify sentinel nodes in the upfront surgery (non-neoadjuvant) setting (select all that apply):Radioactive Tracer  Tracer(s) used to identify sentinel nodes in the neoadjuvant setting (select all that apply):Radioactive Tracer  All nodes (colored or non-colored) present at the end of a dye-filled lymphatic channel were removed:N/A  All significantly radioactive nodes were removed:Yes  All palpable suspicious nodes were removed:N/A  Biopsy-proven positive nodes marked with clips prior to chemotherapy were identified and removed:N/A  Specimen: Right Breast excisional biopsy                     Left Breast mass                     Left breast new interior and posterior margin                     Left axillary Sentinel Lymph nodes #1  Complications: None  Estimated Blood Loss: 10 mL

## 2023-07-19 ENCOUNTER — Other Ambulatory Visit: Payer: Self-pay

## 2023-07-19 DIAGNOSIS — C50412 Malignant neoplasm of upper-outer quadrant of left female breast: Secondary | ICD-10-CM | POA: Diagnosis not present

## 2023-07-19 DIAGNOSIS — I5033 Acute on chronic diastolic (congestive) heart failure: Secondary | ICD-10-CM | POA: Diagnosis not present

## 2023-07-19 DIAGNOSIS — R0902 Hypoxemia: Secondary | ICD-10-CM | POA: Diagnosis not present

## 2023-07-19 LAB — BASIC METABOLIC PANEL
Anion gap: 8 (ref 5–15)
BUN: 15 mg/dL (ref 8–23)
CO2: 27 mmol/L (ref 22–32)
Calcium: 8.7 mg/dL — ABNORMAL LOW (ref 8.9–10.3)
Chloride: 105 mmol/L (ref 98–111)
Creatinine, Ser: 0.93 mg/dL (ref 0.44–1.00)
GFR, Estimated: >60 mL/min (ref >60)
Glucose, Bld: 114 mg/dL — ABNORMAL HIGH (ref 70–99)
Potassium: 3.8 mmol/L (ref 3.5–5.1)
Sodium: 140 mmol/L (ref 135–145)

## 2023-07-19 NOTE — Care Management CC44 (Signed)
 Condition Code 44 Documentation Completed  Patient Details  Name: Jennifer Gregory MRN: [id number] Date of Birth: April 10, 1946   Condition Code 44 given:  Yes Patient signature on Condition Code 44 notice:  Yes Documentation of 2 MD's agreement:  Yes Code 44 added to claim:  Yes    Chapman Fitch, RN 07/19/2023, 9:09 AM

## 2023-07-19 NOTE — TOC Initial Note (Signed)
 Transition of Care [facility]) - Initial/Assessment Note    Patient Details  Name: Jennifer Gregory MRN: [id number] Date of Birth: 02-20-1946  Transition of Care [facility]) CM/SW Contact:    Chapman Fitch, RN Phone Number: 07/19/2023, 9:33 AM  Clinical Narrative:                    Met with patient at bedside Patient states that her son lives with her, and he will be transporting her at discharge Patient qualifies for home O2, and would like to use [employer].  Referral made to Jon with [employer] for O2     Patient Goals and CMS Choice            Expected Discharge Plan and Services         Expected Discharge Date: 07/19/23                                    Prior Living Arrangements/Services                       Activities of Daily Living   ADL Screening (condition at time of admission) Independently performs ADLs?: No Does the patient have a NEW difficulty with bathing/dressing/toileting/self-feeding that is expected to last >3 days?: No Does the patient have a NEW difficulty with getting in/out of bed, walking, or climbing stairs that is expected to last >3 days?: No Does the patient have a NEW difficulty with communication that is expected to last >3 days?: No Is the patient deaf or have difficulty hearing?: No Does the patient have difficulty seeing, even when wearing glasses/contacts?: No Does the patient have difficulty concentrating, remembering, or making decisions?: No  Permission Sought/Granted                  Emotional Assessment              Admission diagnosis:  Hypoxia [R09.02] Patient Active Problem List   Diagnosis Date Noted   Shortness of breath 05/11/2023   Chemotherapy induced neutropenia (HCC) 03/30/2023   Genetic testing 01/29/2023   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 [place] [address] Phone: [phone] Fax: [phone]     Social Drivers of Health (SDOH) Social History: SDOH Screenings   Food Insecurity: No Food Insecurity (07/18/2023)  Housing: Low Risk  (07/18/2023)  Transportation Needs: No Transportation Needs (07/18/2023)  Utilities: Not At Risk (07/18/2023)  Depression (PHQ2-9): Low Risk  (01/19/2023)  Financial Resource Strain: Low Risk  (09/06/2022)   Received from [facility]  Social Connections: Unknown (07/19/2023)  Tobacco Use: Low Risk  (07/18/2023)   SDOH Interventions:     Readmission Risk Interventions     No data to display

## 2023-07-19 NOTE — Care Management Obs Status (Signed)
 MEDICARE OBSERVATION STATUS NOTIFICATION   Patient Details  Name: Jennifer Gregory MRN: [id number] Date of Birth: 1946-02-15   Medicare Observation Status Notification Given:  Yes    Chapman Fitch, RN 07/19/2023, 9:09 AM

## 2023-07-19 NOTE — Progress Notes (Signed)
 Patient ID: Jennifer Gregory, female   DOB: [birth year], 78 y.o.   MRN: [id number]     SURGICAL PROGRESS NOTE   Hospital Day(s): 1.   Interval History:  Patient status post left breast partial mastectomy with sentinel biopsy and right breast excisional biopsy.  Postop she developed decreased O2 sat, most likely due to volume overload.  Patient seen and examined, no acute events or new complaints overnight. Patient reports feeling well this morning.  She endorses some soreness on the left breast but otherwise no severe pain.  Pain controlled with current pain medications.  She denies any chest pain or shortness of breath.  As per discharge flowsheet, last O2 sat is 91% on room air  Vital signs in last 24 hours: [min-max] current  Temp:  [97 F (36.1 C)-97.9 F (36.6 C)] 97.5 F (36.4 C) (02/27 0350) Pulse Rate:  [65-99] 87 (02/27 0350) Resp:  [14-27] 20 (02/27 0350) BP: (101-146)/(59-94) 123/59 (02/27 0350) SpO2:  [81 %-100 %] 91 % (02/27 0350) Weight:  [122.1 kg-122.5 kg] 122.1 kg (02/27 0416)     Height: 5\' 3"  (160 cm) Weight: 122.1 kg BMI (Calculated): 47.7   Physical Exam:  Constitutional: alert, cooperative and no distress  Respiratory: breathing non-labored at rest  Cardiovascular: regular rate and sinus rhythm  Breast: There is localized bruising on each incision, expected, no significant fluid collection.  No hematoma.  Labs:     Latest Ref Rng & Units 06/15/2023    8:16 AM 05/13/2023    8:48 AM 05/12/2023    2:50 AM  CBC  WBC 4.0 - 10.5 K/uL 5.7  4.4  2.7   Hemoglobin 12.0 - 15.0 g/dL 9.6  7.5  7.5   Hematocrit 36.0 - 46.0 % 33.5  24.1  23.8   Platelets 150 - 400 K/uL 159  133  128       Latest Ref Rng & Units 07/19/2023    5:16 AM 06/15/2023    8:16 AM 05/13/2023    8:48 AM  CMP  Glucose 70 - 99 mg/dL 829  562  130   BUN 8 - 23 mg/dL 15  11  10    Creatinine 0.44 - 1.00 mg/dL 8.65  7.84  6.96   Sodium 135 - 145 mmol/L 140  137  137   Potassium 3.5 - 5.1 mmol/L 3.8   4.3  3.0   Chloride 98 - 111 mmol/L 105  105  102   CO2 22 - 32 mmol/L 27  23  27    Calcium 8.9 - 10.3 mg/dL 8.7  8.6  8.1   Total Protein 6.5 - 8.1 g/dL  7.0  5.6   Total Bilirubin 0.0 - 1.2 mg/dL  1.4  1.2   Alkaline Phos 38 - 126 U/L  70  53   AST 15 - 41 U/L  17  14   ALT 0 - 44 U/L  9  11     Imaging studies: No new pertinent imaging studies   Assessment/Plan:  78 y.o. female with breast cancer 1 Day Post-Op s/p partial mastectomy with sentinel lymph node biopsy, complicated by pertinent comorbidities including volume overload after surgery, low O2 sat, CHF, A-fib on anticoagulation.  -Patient is morning alert, no shortness of breath.  She endorses that she feels comfortable.  She is currently on room air -Breast physical exam with expected bruise, otherwise unremarkable -Appreciate hospitalist observation and optimization of her volume status.  From surgical standpoint she can be discharged once medically  stable -Pain medications and postop instructions from surgical standpoint in chart  Gae Gallop, MD

## 2023-07-19 NOTE — Progress Notes (Signed)
SATURATION QUALIFICATIONS: (This note is used to comply with regulatory documentation for home oxygen)  Patient Saturations on Room Air at Rest = 91%  Patient Saturations on Room Air while Ambulating = 87%  Patient Saturations on 2 Liters of oxygen while Ambulating = 90%  Please briefly explain why patient needs home oxygen: 

## 2023-07-19 NOTE — Anesthesia Postprocedure Evaluation (Signed)
 Anesthesia Post Note  Patient: Jennifer Gregory  Procedure(s) Performed: PART MASTECTOMY, bil RADIO FREQUENCY LOCALIZER,   Lt  AXILLARY SENTINEL NODE BIOPSY (Bilateral: Breast)  Patient location during evaluation: PACU Anesthesia Type: General Level of consciousness: awake and alert Pain management: pain level controlled Vital Signs Assessment: post-procedure vital signs reviewed and stable Respiratory status: spontaneous breathing, nonlabored ventilation, respiratory function stable and patient connected to nasal cannula oxygen Cardiovascular status: blood pressure returned to baseline and stable Postop Assessment: no apparent nausea or vomiting Anesthetic complications: no   No notable events documented.   Last Vitals:  Vitals:   07/19/23 0220 07/19/23 0741  BP:  (!) 100/53  Pulse:  95  Resp:  19  Temp:  36.8 C  SpO2: 93% 96%    Last Pain:  Vitals:   07/19/23 0741  TempSrc: Oral  PainSc:                  Corinda Gubler

## 2023-07-19 NOTE — Consult Note (Signed)
 Initial Consultation Note   Patient: Mackenzye Mackel ZOX:[id number] DOB: 1945/10/16 PCP: Rayetta Humphrey, MD DOA: 07/18/2023 DOS: the patient was seen and examined on 07/19/2023 Primary service: Carolan Shiver, MD  Referring physician: Carolan Shiver, MD Reason for consult: hypoxia post-op  HPI Ms. Dhingra is a 78yo female who presented for outpatient lumpectomy left breast. She tolerated procedure well but had hypoxia post-op requiring up to 4L [place] with exertion to maintain saturations in mid-90s. At rest, she was stable on 2L without dyspnea.  Patient endorses having similar episodes in the past for previous surgeries that required intubation.  Had pre-op clearance with cardiology including echo 06/05/23 at [facility] as she has history of heart failure and pacemaker. EF 55% with diastolic dysfunction.   Patient states that she previously had oxygen at home after COVID infection but was discontinued some time ago. She continues to check her oxygen frequently at home and will often have high 80s readings. She was previously diagnosed with sleep apnea and is not adherent with CPAP.  She was also previously prescribed torsemide but states that she has not taken this in days or weeks and has noticed an increase to the chronic LE swelling baseline. Chronically, she has orthopnea and sleeps at an incline in a recliner.  She describes a past hospitalization when she was told to stop diuretics because of low blood pressure.   During our encounter, she does not experience conversational dyspnea but bedside nurse describes shortness of breath with only minimal exertion getting out of bed. She appears comfortable on 2L and O2 sats remain above 94% while we talk. Rales are present bilaterally and she has significant pitting edema of bilateral LEs.  She described mild tenderness at site of her surgical procedure but no other chest pain.  Chest xray positive for atelectasis vs edema.    Assessment/Plan: Her appearance and results of her workup, exam are consistent with hypervolemia likely as her CHF has been exacerbated by IV fluids and not taking any home diuretics.   Patient responded well to diuresis and was able to wean to room air while at rest. Considering her reported history of desaturations at baseline at home with exacerbation, and her history of long recoveries from sedation, I am not all surprised that she is still requiring oxygen with exertion.  DME oxygen 2L was ordered PRN for exertion and at night at discharge.  She is otherwise well to be discharged home and continue her oncology treatment as directed by her oncologist/surgeon.   Troponins remained negative. Had almost 2L measured urine output and additional non-measured UOP.  Kidneys tolerated the diuresis well and had normal Cr on day of discharge.  She should continue her regular home medications at discharge including torsemide as directed by her cardiologist.    TRH will sign off at present, please call us again when needed.   Review of Systems: As mentioned in the history of present illness. All other systems reviewed and are negative. Past Medical History:  Diagnosis Date   (HFpEF) heart failure with preserved ejection fraction (HCC)    Antineoplastic chemotherapy induced anemia    Aortic atherosclerosis (HCC)    Ascending aorta dilatation (HCC)    Atrial fibrillation and flutter (HCC)    a.) CHA2DS2VASc = 7 (age x2, sex, HFpEF, HTN, vascular disease, T2DM) as of 07/09/2023; b.) s/p DCCV 08/19/2013 --> 200 J x 2 with brief conversion and ERAF; c.) s/p DCCV 09/08/2013: 300 J x 1 --> NSR; d.) rate/rhythm maintained on  oral metoprolol succinate; chronically anticoagulated with rivaroxaban   Cardiomegaly    Cirrhosis (HCC)    Complication of anesthesia    a.) "respiratory depression" --> "I need to sit up immediately"; documented history of orthopnea   Coronary artery disease 04/15/2007   a.)  cCTA 04/15/2007: focal mixed calcified/non-calcified plaque (<50% stenosis) pLAD, mLAD, pD1; b.) mPET 03/21/2022: severe CAD and aortic atherosclerosis, normal myocardial perfusion.   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   a.) SJM CRM Zephyr XL DR 06/26/2008 (SN: [id number]); b.) replaced 03/28/2016: STM CRM Assurity MRI (SN: [id number])   Pulmonary HTN (HCC)    a.) TTE 03/13/2012: RVSP 34; b.) TTE 08/19/2013: RVSP 13; c.) TTE 12/03/2013: RVSP 39; d.) TTE 08/20/2014: RVSP 45; e.) TTE 03/14/2016: RVSP 28; f.) TTE 03/01/2021: RVSP 44; g.) TTE 06/05/2023: RVSP 49   Restrictive lung disease    Sick sinus syndrome (HCC) 06/26/2008   a.) s/p PPM 06/26/2008; b.) replaced/upgraded 03/28/2016   Sleep apnea    a.) no nocturnal PAP therapy   Subclinical hyperthyroidism    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   a.) CNB 01/15/2023 --> pathology (+) IMC with focal squamous differentiation (G3, no LVI, ER/PR (-), HER2/neu (-) by FISH; HER2/neu 2+ by IHC) --> stage II triple negative breast cancer; b.) s/p 11 wwekly cycles of neoadjuvant carboplatin + pacliataxel chemotherapy; could not tolerate pembrolizumab due to skin rash   Type 2 diabetes mellitus without complication (HCC)    Vitamin D deficiency    Past Surgical History:  Procedure Laterality Date   ABSCESS DRAINAGE     BREAST BIOPSY Right 01/15/2023   [place] RT BREAST BX W LOC  DEV 1ST LESION IMG BX SPEC US GUIDE 01/15/2023 [facility] (Coil clip --> pathology (+) epithelial proliferation with atypia --> DDx includes an intraductal papilloma with at least ADH)   BREAST BIOPSY Left 01/15/2023   [place] LT BREAST BX W LOC DEV 1ST LESION IMG BX SPEC US GUIDE 01/15/2023 [facility] (Ribbon clip --> pathology (+) IMC with focal squamous differentiation)   BREAST BIOPSY Right 07/04/2023   MM RT RADIO FREQUENCY TAG LOC MAMMO GUIDE 07/04/2023 [facility]   BREAST BIOPSY Left 07/04/2023   [place] LT RADIO FREQUENCY TAG EA ADD LESION LOC US GUIDE 07/04/2023 [facility]   BREAST BIOPSY Right 07/18/2023   [place] RT PLC BREAST LOC DEV   1ST LESION  INC US GUIDE 07/18/2023 [facility]   CARDIAC PACEMAKER PLACEMENT  06/26/2008   St. Jude dual chamber   CARDIOVERSION N/A 09/08/2013   CESAREAN SECTION     x 3   CHOLECYSTECTOMY     PACEMAKER GENERATOR CHANGE  03/28/2016   PORTACATH PLACEMENT Right 01/31/2023   Procedure: INSERTION PORT-A-CATH;  Surgeon: Carolan Shiver, MD;  Location: [facility];  Service: General;  Laterality: Right;   TEE WITH CARDIOVERSION N/A 08/19/2013   TOTAL ABDOMINAL HYSTERECTOMY  TUBAL LIGATION     Social History:  reports that she has never smoked. She has never used smokeless tobacco. She reports that she does not currently use alcohol. She reports that she does not use drugs.  Allergies  Allergen Reactions   Aspirin Other (See Comments)    Bleeding risk since patient is on xarelto   Ciprofloxacin Itching and Rash        Codeine    Diazepam Nausea And Vomiting   Dofetilide Other (See Comments)    Other Reaction: arrhythmia    Hydrocodone-Acetaminophen Nausea And Vomiting   Morphine Nausea And Vomiting   Niacin Other (See Comments)    fatigue   Oxycodone-Acetaminophen Nausea And Vomiting   Pembrolizumab Rash   Sotalol Other (See Comments)    Other Reaction: Prolonged QTc    Amiodarone Other (See Comments)    Affecting  liver    Pyridoxine Other (See Comments)    Splitting headache   Sulfamethoxazole-Trimethoprim Swelling   Other Rash    EKG sticker electrodes extended time    Family History  Problem Relation Age of Onset   Mesothelioma Father     Prior to Admission medications   Medication Sig Start Date End Date Taking? Authorizing Provider  citalopram (CELEXA) 10 MG tablet Take 10 mg by mouth daily.   Yes [provider]  diphenoxylate-atropine (LOMOTIL) 2.5-0.025 MG tablet Take 1 tablet by mouth 4 (four) times daily as needed for diarrhea or loose stools. 03/09/23  Yes Borders, Daryl Eastern, NP  HYDROcodone-acetaminophen (NORCO/VICODIN) 5-325 MG tablet Take 1 tablet by mouth every 6 (six) hours as needed for up to 3 days. 07/18/23 07/21/23 Yes Carolan Shiver, MD  metoprolol succinate (TOPROL-XL) 25 MG 24 hr tablet Take 12.5 mg by mouth daily.   Yes [provider]  Multiple Vitamins-Minerals (CENTRUM SILVER 50+WOMEN PO) Take 1 tablet by mouth daily.   Yes [provider]  potassium chloride (KLOR-CON) 10 MEQ tablet Take 30 mEq by mouth 2 (two) times daily. 07/14/19  Yes [provider]  rivaroxaban (XARELTO) 20 MG TABS tablet Take 20 mg by mouth every evening. 05/30/19  Yes [provider]  torsemide (DEMADEX) 20 MG tablet Take 40 mg by mouth daily in the afternoon.   Yes [provider]  triamcinolone ointment (KENALOG) 0.5 % Apply 1 Application topically 2 (two) times daily as needed (itching from rash). 02/23/23  Yes Creig Hines, MD  clobetasol cream (TEMOVATE) 0.05 % Apply topically. Patient not taking: Reported on 07/03/2023 04/03/23   [provider]  collagenase (SANTYL) 250 UNIT/GM ointment Apply topically daily. Patient not taking: Reported on 07/03/2023 05/14/23   Esaw Grandchild A, DO  dexamethasone (DECADRON) 4 MG tablet Take 2 tablets daily for 2 days, start the day after chemotherapy. Take with food. Patient not taking:  Reported on 06/29/2023 01/19/23   Creig Hines, MD  feeding supplement (ENSURE ENLIVE / ENSURE PLUS) LIQD Take 237 mLs by mouth 2 (two) times daily between meals. Patient not taking: Reported on 07/03/2023 05/13/23   Pennie Banter, DO  lidocaine-prilocaine (EMLA) cream Apply to affected area once 01/19/23   Creig Hines, MD  magic mouthwash w/lidocaine SOLN Take 15 mLs by mouth 4 (four) times daily as needed for mouth pain. Suspension contains equal amounts of Maalox Extra Strength, nystatin, diphenhydramine and lidocaine. Patient not taking: Reported on 07/03/2023 05/13/23   Esaw Grandchild A, DO  nitroGLYCERIN (NITROSTAT) 0.4 MG SL tablet Place 0.4 mg under the tongue  every 5 (five) minutes as needed for chest pain. Patient not taking: Reported on 02/08/2023    [provider]  nutrition supplement, JUVEN, (JUVEN) PACK Take 1 packet by mouth 2 (two) times daily between meals. Patient not taking: Reported on 07/03/2023 05/13/23   Esaw Grandchild A, DO  nystatin (MYCOSTATIN) 100000 UNIT/ML suspension Take 5 mLs (500,000 Units total) by mouth 4 (four) times daily. Patient not taking: Reported on 07/03/2023 05/13/23   Esaw Grandchild A, DO  ondansetron (ZOFRAN) 4 MG tablet Take 1 tablet (4 mg total) by mouth daily as needed for nausea or vomiting. Patient not taking: Reported on 06/29/2023 01/31/23 01/31/24  Carolan Shiver, MD  prochlorperazine (COMPAZINE) 10 MG tablet Take 1 tablet (10 mg total) by mouth every 6 (six) hours as needed for nausea or vomiting. Patient not taking: Reported on 06/29/2023 01/19/23   Creig Hines, MD    Physical Exam: Vitals:   07/18/23 2015 07/18/23 2235 07/19/23 0220 07/19/23 0416  BP:      Pulse:  90    Resp:      Temp:      TempSrc:      SpO2: 93% 95% 93%   Weight:    122.1 kg  Height:       Physical Exam Vitals and nursing note reviewed. Exam conducted with a chaperone present.  Constitutional:      General: She is not in acute distress.     Appearance: Normal appearance. She is obese. She is not ill-appearing, toxic-appearing or diaphoretic.  Cardiovascular:     Rate and Rhythm: Normal rate and regular rhythm.     Pulses: Normal pulses.  Pulmonary:     Effort: Pulmonary effort is normal. No respiratory distress.     Breath sounds: No stridor. Rales present. No wheezing.  Chest:     Chest wall: Tenderness present.  Abdominal:     General: There is no distension.     Palpations: Abdomen is soft.     Tenderness: There is no abdominal tenderness.  Musculoskeletal:     Cervical back: Normal range of motion.     Right lower leg: Edema present.     Left lower leg: Edema present.  Skin:    General: Skin is warm and dry.     Comments: Chronic venous stasis changes of lower extremities. Thick, rough skin with pitting edema bilaterally. Improved since prior examination    Neurological:     Mental Status: She is alert.  Psychiatric:        Mood and Affect: Mood normal.        Behavior: Behavior normal.        Judgment: Judgment normal.     Data Reviewed:     Latest Ref Rng & Units 06/15/2023    8:16 AM 05/13/2023    8:48 AM 05/12/2023    2:50 AM  CBC  WBC 4.0 - 10.5 K/uL 5.7  4.4  2.7   Hemoglobin 12.0 - 15.0 g/dL 9.6  7.5  7.5   Hematocrit 36.0 - 46.0 % 33.5  24.1  23.8   Platelets 150 - 400 K/uL 159  133  128       Latest Ref Rng & Units 07/19/2023    5:16 AM 06/15/2023    8:16 AM 05/13/2023    8:48 AM  BMP  Glucose 70 - 99 mg/dL 161  096  045   BUN 8 - 23 mg/dL 15  11  10    Creatinine 0.44 -  1.00 mg/dL 2.95  6.21  3.08   Sodium 135 - 145 mmol/L 140  137  137   Potassium 3.5 - 5.1 mmol/L 3.8  4.3  3.0   Chloride 98 - 111 mmol/L 105  105  102   CO2 22 - 32 mmol/L 27  23  27    Calcium 8.9 - 10.3 mg/dL 8.7  8.6  8.1    DG Chest Port 1 View Result Date: 07/18/2023 CLINICAL DATA:  Status post mastectomy. EXAM: PORTABLE CHEST 1 VIEW COMPARISON:  May 11, 2023. FINDINGS: Stable cardiomegaly. Left-sided pacemaker  is unchanged. Right internal jugular Port-A-Cath is unchanged. Mild perihilar and basilar opacities are noted concerning for atelectasis or edema and possible associated pleural effusions. Bony thorax is unremarkable. IMPRESSION: Mild bilateral perihilar and basilar opacities are noted concerning for atelectasis or edema and possible associated pleural effusions. Electronically Signed   By: Lupita Raider M.D.   On: 07/18/2023 15:52   NM Sentinel Node Inj-No Rpt (Breast) Result Date: 07/18/2023 Sulfur Colloid was injected by the Nuclear Medicine Technologist for sentinel lymph node localization.   MM 3D DIAGNOSTIC MAMMOGRAM UNILATERAL RIGHT BREAST Result Date: 07/18/2023 CLINICAL DATA:  78 year old female presenting for wire placement prior to excision of RIGHT papilloma with ADH. EXAM: DIAGNOSTIC RIGHT MAMMOGRAM POST PLACEMENT OF WIRE LOCALIZATION COMPARISON:  Previous exam(s). ACR Breast Density Category c: The breasts are heterogeneously dense, which may obscure small masses. FINDINGS: Mammographic images were obtained following ultrasound guided placement of a 7 cm Kopan's wire in the RIGHT breast. These demonstrate that the wire traverses the COIL shaped biopsy marking clip at the site of biopsy-proven papilloma with ADH. Notably, the thick portion of the wire is located approximately 10 mm distal to the COIL clip in the medial direction. The SAVI scout is in stable in position, proximally 17 mm inferior to the coil shaped biopsy marking clip. IMPRESSION: Wire localization of the RIGHT breast. The wire traverses the COIL shaped biopsy marking clip, with the thick portion of the wire located approximately 10 mm distal to the clip in the medial direction. Final Assessment: Post Procedure Mammograms for [facility] placement Electronically Signed   By: Jacob Moores M.D.   On: 07/18/2023 09:57   [place] RT PLC BREAST LOC DEV   1ST LESION  INC US GUIDE Result Date: 07/18/2023 CLINICAL DATA:  78 year old  female presenting for RIGHT breast wire localization prior to excision for biopsy-proven papilloma with ADH (demarcated by coil clip). EXAM: NEEDLE LOCALIZATION OF THE RIGHT BREAST WITH ULTRASOUND GUIDANCE COMPARISON:  Previous exam(s). FINDINGS: Patient presents for needle localization prior to excisional biopsy. I met with the patient and we discussed the procedure of needle localization including benefits and alternatives. We discussed the high likelihood of a successful procedure. We discussed the risks of the procedure, including infection, bleeding, tissue injury, and further surgery. Informed, written consent was given. The usual time-out protocol was performed immediately prior to the procedure. Using ultrasound guidance, sterile technique, 1% lidocaine and a 7 cm modified Kopans needle, COIL clip in the RIGHT breast 12 o'clock position 1 cm from the nipple was localized using a lateral approach. The images were marked for Dr. Hazle Quant. IMPRESSION: Needle localization of the RIGHT breast. No apparent complications. Electronically Signed   By: Jacob Moores M.D.   On: 07/18/2023 08:44     Family Communication: son at bedside  Primary team communication: spoke with attending on phone Thank you very much for involving [place] in the care of  your patient.  Author: Leeroy Bock, MD 07/19/2023 7:37 AM  For on call review [email or web address].

## 2023-07-19 NOTE — Discharge Summary (Signed)
 Patient ID: Jennifer Gregory MRN: [id number] DOB/AGE: Oct 15, 1945 78 y.o.  Admit date: 07/18/2023 Discharge date: 07/19/2023   Discharge Diagnoses:  Principal Problem:   Hypoxia   Procedures: Left breast radiofrequency guided partial mastectomy with axillary sentinel lymph biopsy Right breast excisional biopsy  Hospital Course: Patient admitted for management of left breast cancer and right breast intraductal papilloma.  After surgery she developed low O2 sat compared to her baseline.  The was assess by hospitalist due to volume overload.  She was admitted for diuresis.  This morning patient with O2 sat at 91% on room air.  Patient without shortness of breath.  Patient without chest pain.  Patient talking in complete sentences.  Physical exam with suspected bruise in the incisional area, localized, not expanding.  No sign of seroma or hematoma this time.  Physical Exam Vitals reviewed.  HENT:     Head: Normocephalic.     Nose: Nose normal.  Cardiovascular:     Rate and Rhythm: Normal rate.  Pulmonary:     Effort: Pulmonary effort is normal.     Breath sounds: Normal breath sounds.  Chest:     Comments: There is a localized bruise in each incision on both breasts.  No sign of significant fluid collection or hematoma. Abdominal:     General: Abdomen is flat.     Palpations: Abdomen is soft.  Musculoskeletal:        General: Normal range of motion.     Cervical back: Normal range of motion.  Skin:    General: Skin is warm.     Capillary Refill: Capillary refill takes less than 2 seconds.  Neurological:     General: No focal deficit present.     Mental Status: She is alert and oriented to person, place, and time.      Consults: Hospitalist  Disposition: Discharge disposition: 01-Home or Self Care       Discharge Instructions     Diet - low sodium heart healthy   Complete by: As directed    Diet - low sodium heart healthy   Complete by: As directed    Increase  activity slowly   Complete by: As directed    Increase activity slowly   Complete by: As directed       Allergies as of 07/19/2023       Reactions   Aspirin Other (See Comments)   Bleeding risk since patient is on xarelto   Ciprofloxacin Itching, Rash      Codeine    Diazepam Nausea And Vomiting   Dofetilide Other (See Comments)   Other Reaction: arrhythmia   Hydrocodone-acetaminophen Nausea And Vomiting   Morphine Nausea And Vomiting   Niacin Other (See Comments)   fatigue   Oxycodone-acetaminophen Nausea And Vomiting   Pembrolizumab Rash   Sotalol Other (See Comments)   Other Reaction: Prolonged QTc   Amiodarone Other (See Comments)   Affecting liver   Pyridoxine Other (See Comments)   Splitting headache   Sulfamethoxazole-trimethoprim Swelling   Other Rash   EKG sticker electrodes extended time        Medication List     TAKE these medications    CENTRUM SILVER 50+WOMEN PO Take 1 tablet by mouth daily.   citalopram 10 MG tablet Commonly known as: CELEXA Take 10 mg by mouth daily.   clobetasol cream 0.05 % Commonly known as: TEMOVATE Apply topically.   collagenase 250 UNIT/GM ointment Commonly known as: SANTYL Apply topically daily.   dexamethasone 4  MG tablet Commonly known as: DECADRON Take 2 tablets daily for 2 days, start the day after chemotherapy. Take with food.   diphenoxylate-atropine 2.5-0.025 MG tablet Commonly known as: LOMOTIL Take 1 tablet by mouth 4 (four) times daily as needed for diarrhea or loose stools.   feeding supplement Liqd Take 237 mLs by mouth 2 (two) times daily between meals.   nutrition supplement (JUVEN) Pack Take 1 packet by mouth 2 (two) times daily between meals.   HYDROcodone-acetaminophen 5-325 MG tablet Commonly known as: NORCO/VICODIN Take 1 tablet by mouth every 6 (six) hours as needed for up to 3 days.   lidocaine-prilocaine cream Commonly known as: EMLA Apply to affected area once   magic mouthwash  w/lidocaine Soln Take 15 mLs by mouth 4 (four) times daily as needed for mouth pain. Suspension contains equal amounts of Maalox Extra Strength, nystatin, diphenhydramine and lidocaine.   metoprolol succinate 25 MG 24 hr tablet Commonly known as: TOPROL-XL Take 12.5 mg by mouth daily.   nitroGLYCERIN 0.4 MG SL tablet Commonly known as: NITROSTAT Place 0.4 mg under the tongue every 5 (five) minutes as needed for chest pain.   nystatin 100000 UNIT/ML suspension Commonly known as: MYCOSTATIN Take 5 mLs (500,000 Units total) by mouth 4 (four) times daily.   ondansetron 4 MG tablet Commonly known as: Zofran Take 1 tablet (4 mg total) by mouth daily as needed for nausea or vomiting.   potassium chloride 10 MEQ tablet Commonly known as: KLOR-CON Take 30 mEq by mouth 2 (two) times daily.   prochlorperazine 10 MG tablet Commonly known as: COMPAZINE Take 1 tablet (10 mg total) by mouth every 6 (six) hours as needed for nausea or vomiting.   torsemide 20 MG tablet Commonly known as: DEMADEX Take 40 mg by mouth daily in the afternoon.   triamcinolone ointment 0.5 % Commonly known as: KENALOG Apply 1 Application topically 2 (two) times daily as needed (itching from rash).   Xarelto 20 MG Tabs tablet Generic drug: rivaroxaban Take 20 mg by mouth every evening.        Follow-up Information     Carolan Shiver, MD. Go on 08/02/2023.   Specialty: General Surgery Why: at 9:30 am, Post operative follow up, As Scheduled Contact information: [address] [place] [address] [phone]

## 2023-07-20 ENCOUNTER — Encounter: Payer: Self-pay | Admitting: General Surgery

## 2023-07-20 LAB — SURGICAL PATHOLOGY

## 2023-08-07 ENCOUNTER — Other Ambulatory Visit: Payer: Self-pay

## 2023-08-08 ENCOUNTER — Inpatient Hospital Stay (HOSPITAL_BASED_OUTPATIENT_CLINIC_OR_DEPARTMENT_OTHER): Payer: Medicare HMO | Admitting: Oncology

## 2023-08-08 ENCOUNTER — Encounter: Payer: Self-pay | Admitting: Oncology

## 2023-08-08 ENCOUNTER — Telehealth: Payer: Self-pay

## 2023-08-08 ENCOUNTER — Encounter: Payer: Self-pay | Admitting: *Deleted

## 2023-08-08 ENCOUNTER — Inpatient Hospital Stay: Payer: Medicare HMO | Attending: Oncology

## 2023-08-08 VITALS — BP 134/75 | HR 97 | Temp 97.1°F | Resp 18 | Wt 261.4 lb

## 2023-08-08 DIAGNOSIS — C50412 Malignant neoplasm of upper-outer quadrant of left female breast: Secondary | ICD-10-CM | POA: Diagnosis present

## 2023-08-08 DIAGNOSIS — D6481 Anemia due to antineoplastic chemotherapy: Secondary | ICD-10-CM | POA: Diagnosis not present

## 2023-08-08 DIAGNOSIS — Z79899 Other long term (current) drug therapy: Secondary | ICD-10-CM | POA: Diagnosis not present

## 2023-08-08 DIAGNOSIS — R29898 Other symptoms and signs involving the musculoskeletal system: Secondary | ICD-10-CM | POA: Diagnosis not present

## 2023-08-08 DIAGNOSIS — T451X5A Adverse effect of antineoplastic and immunosuppressive drugs, initial encounter: Secondary | ICD-10-CM

## 2023-08-08 DIAGNOSIS — Z171 Estrogen receptor negative status [ER-]: Secondary | ICD-10-CM | POA: Diagnosis not present

## 2023-08-08 DIAGNOSIS — D638 Anemia in other chronic diseases classified elsewhere: Secondary | ICD-10-CM

## 2023-08-08 DIAGNOSIS — Z9221 Personal history of antineoplastic chemotherapy: Secondary | ICD-10-CM | POA: Diagnosis not present

## 2023-08-08 LAB — CBC WITH DIFFERENTIAL (CANCER CENTER ONLY)
Abs Immature Granulocytes: 0.03 10*3/uL (ref 0.00–0.07)
Basophils Absolute: 0 10*3/uL (ref 0.0–0.1)
Basophils Relative: 1 %
Eosinophils Absolute: 0.1 10*3/uL (ref 0.0–0.5)
Eosinophils Relative: 3 %
HCT: 34.4 % — ABNORMAL LOW (ref 36.0–46.0)
Hemoglobin: 9.8 g/dL — ABNORMAL LOW (ref 12.0–15.0)
Immature Granulocytes: 1 %
Lymphocytes Relative: 21 %
Lymphs Abs: 0.9 10*3/uL (ref 0.7–4.0)
MCH: 26.7 pg (ref 26.0–34.0)
MCHC: 28.5 g/dL — ABNORMAL LOW (ref 30.0–36.0)
MCV: 93.7 fL (ref 80.0–100.0)
Monocytes Absolute: 0.4 10*3/uL (ref 0.1–1.0)
Monocytes Relative: 9 %
Neutro Abs: 2.8 10*3/uL (ref 1.7–7.7)
Neutrophils Relative %: 65 %
Platelet Count: 180 10*3/uL (ref 150–400)
RBC: 3.67 MIL/uL — ABNORMAL LOW (ref 3.87–5.11)
RDW: 15.6 % — ABNORMAL HIGH (ref 11.5–15.5)
WBC Count: 4.3 10*3/uL (ref 4.0–10.5)
nRBC: 0 % (ref 0.0–0.2)

## 2023-08-08 LAB — CMP (CANCER CENTER ONLY)
ALT: 7 U/L (ref 0–44)
AST: 17 U/L (ref 15–41)
Albumin: 3.1 g/dL — ABNORMAL LOW (ref 3.5–5.0)
Alkaline Phosphatase: 84 U/L (ref 38–126)
Anion gap: 6 (ref 5–15)
BUN: 11 mg/dL (ref 8–23)
CO2: 27 mmol/L (ref 22–32)
Calcium: 8.8 mg/dL — ABNORMAL LOW (ref 8.9–10.3)
Chloride: 105 mmol/L (ref 98–111)
Creatinine: 0.82 mg/dL (ref 0.44–1.00)
GFR, Estimated: >60 mL/min (ref >60)
Glucose, Bld: 109 mg/dL — ABNORMAL HIGH (ref 70–99)
Potassium: 4.8 mmol/L (ref 3.5–5.1)
Sodium: 138 mmol/L (ref 135–145)
Total Bilirubin: 1.1 mg/dL (ref 0.0–1.2)
Total Protein: 7.6 g/dL (ref 6.5–8.1)

## 2023-08-08 NOTE — Progress Notes (Signed)
 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  This was followed by a diagnostic mammogram and ultrasound.  Mammogram showed 3.4 x 1.8 x 3.5 cm mass at the 3 o'clock position of the left breast.  There was also an intraductal mass located at the 12 o'clock position of the right breast.  No axillary adenopathy was noted.  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  Right breast mass showed fragments of epithelial proliferation with atypia differential diagnosis includes intraductal papilloma with at least atypical ductal hyperplasia.   Patient has a history of heart failure with reduced ejection fraction and atrial fibrillation for which she follows up with Dr. Darin Engels from cardiology.  Plan is to proceed with 12 weeks of weekly CarboTaxol chemotherapy along with Keytruda.  Anthracycline will not be given.  Patient could not tolerate Keytruda as evidenced by diarrhea and significant skin rash and was therefore stopped after 2 cycles.  Patient had significant skin rash that persisted despite stopping Keytruda and there was concern for allergic reaction as  well because of which Taxol was stopped after 4 cycles and she was switched to Abraxane.  She completed 11 weekly cycles of carboplatin and Abraxane.  She had significant fatigue and required hospitalization after that and therefore 12th cycle was not completed   Patient had left lumpectomy with sentinel lymph node biopsy as well as excision biopsy of the right atypical ductal hyperplasia.  Left breast pathology showed complete pathological response to treatment and no evidence of residual malignancy.  1 sentinel and 2 nonsentinel lymph nodes negative for malignancy.Right breast pathology showed usual ductal hyperplasia negative for atypia and malignancy.  Interval history-she still feels weak after her surgery but is overall doing well.  Appetite is improving.  She denies any significant pain presently.  ECOG PS- 2 Pain scale- 0   Review of systems- Review of Systems  Constitutional:  Negative for chills, fever, malaise/fatigue and weight loss.  HENT:  Negative for congestion, ear discharge and nosebleeds.   Eyes:  Negative for blurred vision.  Respiratory:  Negative for cough, hemoptysis, sputum production, shortness of breath and wheezing.   Cardiovascular:  Negative for chest pain, palpitations, orthopnea and claudication.  Gastrointestinal:  Negative for abdominal pain, blood in stool, constipation, diarrhea, heartburn, melena, nausea and vomiting.  Genitourinary:  Negative for dysuria, flank pain, frequency, hematuria and urgency.  Musculoskeletal:  Negative for back pain, joint pain and myalgias.  Skin:  Negative for rash.  Neurological:  Negative for dizziness, tingling, focal weakness, seizures, weakness and headaches.  Endo/Heme/Allergies:  Does not bruise/bleed easily.  Psychiatric/Behavioral:  Negative for depression and suicidal ideas. The patient does not have insomnia.  Allergies  Allergen Reactions   Aspirin Other (See Comments)    Bleeding risk since patient is on  xarelto   Ciprofloxacin Itching and Rash        Codeine    Diazepam Nausea And Vomiting   Dofetilide Other (See Comments)    Other Reaction: arrhythmia    Hydrocodone-Acetaminophen Nausea And Vomiting   Morphine Nausea And Vomiting   Niacin Other (See Comments)    fatigue   Oxycodone-Acetaminophen Nausea And Vomiting   Pembrolizumab Rash   Sotalol Other (See Comments)    Other Reaction: Prolonged QTc    Amiodarone Other (See Comments)    Affecting liver    Pyridoxine Other (See Comments)    Splitting headache   Sulfamethoxazole-Trimethoprim Swelling   Other Rash    EKG sticker electrodes extended time     Past Medical History:  Diagnosis Date   (HFpEF) heart failure with preserved ejection fraction (HCC)    Antineoplastic chemotherapy induced anemia    Aortic atherosclerosis (HCC)    Ascending aorta dilatation (HCC)    Atrial fibrillation and flutter (HCC)    a.) CHA2DS2VASc = 7 (age x2, sex, HFpEF, HTN, vascular disease, T2DM) as of 07/09/2023; b.) s/p DCCV 08/19/2013 --> 200 J x 2 with brief conversion and ERAF; c.) s/p DCCV 09/08/2013: 300 J x 1 --> NSR; d.) rate/rhythm maintained on oral metoprolol succinate; chronically anticoagulated with rivaroxaban   Cardiomegaly    Cirrhosis (HCC)    Complication of anesthesia    a.) "respiratory depression" --> "I need to sit up immediately"; documented history of orthopnea   Coronary artery disease 04/15/2007   a.) cCTA 04/15/2007: focal mixed calcified/non-calcified plaque (<50% stenosis) pLAD, mLAD, pD1; b.) mPET 03/21/2022: severe CAD and aortic atherosclerosis, normal myocardial perfusion.   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   a.) SJM CRM Zephyr XL DR 06/26/2008 (SN: [id number]); b.) replaced 03/28/2016: STM CRM Assurity MRI (SN: [id number])   Pulmonary HTN (HCC)    a.) TTE 03/13/2012: RVSP 34; b.) TTE 08/19/2013: RVSP 13; c.) TTE 12/03/2013: RVSP 39; d.) TTE 08/20/2014: RVSP 45; e.) TTE 03/14/2016: RVSP 28; f.) TTE 03/01/2021: RVSP 44; g.) TTE 06/05/2023: RVSP 49   Restrictive lung disease    Sick sinus syndrome (HCC) 06/26/2008   a.) s/p PPM 06/26/2008; b.) replaced/upgraded 03/28/2016   Sleep apnea    a.) no nocturnal PAP therapy   Subclinical hyperthyroidism    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   a.) CNB 01/15/2023 --> pathology (+) IMC with focal squamous differentiation (G3, no LVI, ER/PR (-), HER2/neu (-) by FISH; HER2/neu 2+ by IHC) --> stage II triple negative breast cancer; b.) s/p 11 wwekly cycles of neoadjuvant carboplatin + pacliataxel chemotherapy; could not tolerate pembrolizumab due to skin rash   Type 2 diabetes mellitus without complication (HCC)    Vitamin D deficiency  Past Surgical History:  Procedure Laterality Date   ABSCESS DRAINAGE     BREAST BIOPSY Right 01/15/2023   [place] RT BREAST BX W LOC DEV 1ST LESION IMG BX SPEC US GUIDE 01/15/2023 [facility] (Coil clip --> pathology (+) epithelial proliferation with atypia --> DDx includes an intraductal papilloma with at least ADH)   BREAST BIOPSY Left 01/15/2023   [place] LT BREAST BX W LOC DEV 1ST LESION IMG BX SPEC US GUIDE 01/15/2023 [facility] (Ribbon clip --> pathology (+) IMC with focal squamous differentiation)   BREAST BIOPSY Right 07/04/2023   MM RT RADIO FREQUENCY TAG LOC MAMMO GUIDE 07/04/2023 [facility]   BREAST BIOPSY Left 07/04/2023    [place] LT RADIO FREQUENCY TAG EA ADD LESION LOC US GUIDE 07/04/2023 [facility]   BREAST BIOPSY Right 07/18/2023   [place] RT PLC BREAST LOC DEV   1ST LESION  INC US GUIDE 07/18/2023 [facility]   CARDIAC PACEMAKER PLACEMENT  06/26/2008   St. Jude dual chamber   CARDIOVERSION N/A 09/08/2013   CESAREAN SECTION     x 3   CHOLECYSTECTOMY     PACEMAKER GENERATOR CHANGE  03/28/2016   PART MASTECTOMY,RADIO FREQUENCY LOCALIZER,AXILLARY SENTINEL NODE BIOPSY Bilateral 07/18/2023   Procedure: PART MASTECTOMY, bil RADIO FREQUENCY LOCALIZER,   Lt  AXILLARY SENTINEL NODE BIOPSY;  Surgeon: Carolan Shiver, MD;  Location: [facility];  Service: General;  Laterality: Bilateral;   PORTACATH PLACEMENT Right 01/31/2023   Procedure: INSERTION PORT-A-CATH;  Surgeon: Carolan Shiver, MD;  Location: [facility];  Service: General;  Laterality: Right;   TEE WITH CARDIOVERSION N/A 08/19/2013   TOTAL ABDOMINAL HYSTERECTOMY     TUBAL LIGATION      Social History   Socioeconomic History   Marital status: Single    Spouse name: Not on file   Number of children: 3   Years of education: Not on file   Highest education level: Not on file  Occupational History   Not on file  Tobacco Use   Smoking status: Never   Smokeless tobacco: Never  Vaping Use   Vaping status: Never Used  Substance and Sexual Activity   Alcohol use: Not Currently   Drug use: Never   Sexual activity: Not Currently  Other Topics Concern   Not on file  Social History Narrative   Not on file   Social Drivers of Health   Financial Resource Strain: Low Risk  (09/06/2022)   [facility]   Overall Financial Resource Strain (CARDIA)    Difficulty of Paying Living Expenses: Not hard at all  Food Insecurity: No Food Insecurity (07/18/2023)   Hunger Vital Sign    Worried About Running Out of Food in the Last Year: Never true    Ran Out of Food in the Last Year: Never true  Transportation Needs: No  Transportation Needs (07/18/2023)   PRAPARE - Transportation    Lack of Transportation (Medical): No    Lack of Transportation (Non-Medical): No  Physical Activity: Not on file  Stress: Not on file  Social Connections: Unknown (07/19/2023)   Social Connection and Isolation Panel [NHANES]    Frequency of Communication with Friends and Family: Not on file    Frequency of Social Gatherings with Friends and Family: Not on file    Attends Religious Services: Not on file    Active Member of Clubs or Organizations: Not on file    Attends Banker Meetings: Not on file    Marital Status: Divorced  Intimate Partner  Violence: Not At Risk (07/18/2023)   Humiliation, Afraid, Rape, and Kick questionnaire    Fear of Current or Ex-Partner: No    Emotionally Abused: No    Physically Abused: No    Sexually Abused: No    Family History  Problem Relation Age of Onset   Mesothelioma Father      Current Outpatient Medications:    citalopram (CELEXA) 10 MG tablet, Take 10 mg by mouth daily., Disp: , Rfl:    lidocaine-prilocaine (EMLA) cream, Apply to affected area once, Disp: 30 g, Rfl: 3   metoprolol succinate (TOPROL-XL) 25 MG 24 hr tablet, Take 12.5 mg by mouth daily., Disp: , Rfl:    potassium chloride (KLOR-CON) 10 MEQ tablet, Take 30 mEq by mouth 2 (two) times daily., Disp: , Rfl:    rivaroxaban (XARELTO) 20 MG TABS tablet, Take 20 mg by mouth every evening., Disp: , Rfl:    torsemide (DEMADEX) 20 MG tablet, Take 40 mg by mouth daily in the afternoon., Disp: , Rfl:    triamcinolone ointment (KENALOG) 0.5 %, Apply 1 Application topically 2 (two) times daily as needed (itching from rash)., Disp: 30 g, Rfl: 1   clobetasol cream (TEMOVATE) 0.05 %, Apply topically. (Patient not taking: Reported on 07/03/2023), Disp: , Rfl:    collagenase (SANTYL) 250 UNIT/GM ointment, Apply topically daily. (Patient not taking: Reported on 07/03/2023), Disp: 15 g, Rfl: 0   dexamethasone (DECADRON) 4 MG  tablet, Take 2 tablets daily for 2 days, start the day after chemotherapy. Take with food. (Patient not taking: Reported on 06/29/2023), Disp: 30 tablet, Rfl: 1   diphenoxylate-atropine (LOMOTIL) 2.5-0.025 MG tablet, Take 1 tablet by mouth 4 (four) times daily as needed for diarrhea or loose stools. (Patient not taking: Reported on 08/08/2023), Disp: 30 tablet, Rfl: 0   feeding supplement (ENSURE ENLIVE / ENSURE PLUS) LIQD, Take 237 mLs by mouth 2 (two) times daily between meals. (Patient not taking: Reported on 07/03/2023), Disp: , Rfl:    magic mouthwash w/lidocaine SOLN, Take 15 mLs by mouth 4 (four) times daily as needed for mouth pain. Suspension contains equal amounts of Maalox Extra Strength, nystatin, diphenhydramine and lidocaine. (Patient not taking: Reported on 07/03/2023), Disp: 250 mL, Rfl: 1   Multiple Vitamins-Minerals (CENTRUM SILVER 50+WOMEN PO), Take 1 tablet by mouth daily., Disp: , Rfl:    nitroGLYCERIN (NITROSTAT) 0.4 MG SL tablet, Place 0.4 mg under the tongue every 5 (five) minutes as needed for chest pain. (Patient not taking: Reported on 02/08/2023), Disp: , Rfl:    nutrition supplement, JUVEN, (JUVEN) PACK, Take 1 packet by mouth 2 (two) times daily between meals. (Patient not taking: Reported on 07/03/2023), Disp: 30 each, Rfl: 0   nystatin (MYCOSTATIN) 100000 UNIT/ML suspension, Take 5 mLs (500,000 Units total) by mouth 4 (four) times daily. (Patient not taking: Reported on 07/03/2023), Disp: 473 mL, Rfl: 0   ondansetron (ZOFRAN) 4 MG tablet, Take 1 tablet (4 mg total) by mouth daily as needed for nausea or vomiting. (Patient not taking: Reported on 06/29/2023), Disp: 30 tablet, Rfl: 1   prochlorperazine (COMPAZINE) 10 MG tablet, Take 1 tablet (10 mg total) by mouth every 6 (six) hours as needed for nausea or vomiting. (Patient not taking: Reported on 06/29/2023), Disp: 30 tablet, Rfl: 1  Physical exam:  Vitals:   08/08/23 0900  BP: 134/75  Pulse: 97  Resp: 18  Temp: (!) 97.1 F  (36.2 C)  TempSrc: Tympanic  SpO2: 99%  Weight: 261 lb 6.4 oz (  118.6 kg)   Physical Exam Cardiovascular:     Rate and Rhythm: Normal rate and regular rhythm.     Heart sounds: Normal heart sounds.  Pulmonary:     Effort: Pulmonary effort is normal.     Breath sounds: Normal breath sounds.  Musculoskeletal:     Comments: Bilateral changes of stasis dermatitis  Skin:    General: Skin is warm and dry.  Neurological:     Mental Status: She is alert and oriented to person, place, and time.         Latest Ref Rng & Units 08/08/2023    8:41 AM  CMP  Glucose 70 - 99 mg/dL 161   BUN 8 - 23 mg/dL 11   Creatinine 0.96 - 1.00 mg/dL 0.45   Sodium 409 - 811 mmol/L 138   Potassium 3.5 - 5.1 mmol/L 4.8   Chloride 98 - 111 mmol/L 105   CO2 22 - 32 mmol/L 27   Calcium 8.9 - 10.3 mg/dL 8.8   Total Protein 6.5 - 8.1 g/dL 7.6   Total Bilirubin 0.0 - 1.2 mg/dL 1.1   Alkaline Phos 38 - 126 U/L 84   AST 15 - 41 U/L 17   ALT 0 - 44 U/L 7       Latest Ref Rng & Units 08/08/2023    8:41 AM  CBC  WBC 4.0 - 10.5 K/uL 4.3   Hemoglobin 12.0 - 15.0 g/dL 9.8   Hematocrit 91.4 - 46.0 % 34.4   Platelets 150 - 400 K/uL 180     No images are attached to the encounter.  MM Breast Surgical Specimen Result Date: 07/18/2023 CLINICAL DATA:  Status post excisional biopsy of the RIGHT breast for papilloma/ADH (COIL clip) EXAM: SPECIMEN RADIOGRAPH OF THE RIGHT BREAST COMPARISON:  Previous exam(s). FINDINGS: Status post excision of the right breast. The wire tip and COIL shaped biopsy marker clip are present and are marked for pathology. Of note, a SAVI scout reflector is NOT noted in the provided specimen radiograph. Per communication with Dr. Hazle Quant, the excised SAVI scout is located on a separate image, which is located on the media tab on the patient's EPIC chart. IMPRESSION: Specimen radiograph of the RIGHT breast. Electronically Signed   By: Jacob Moores M.D.   On: 07/18/2023 16:32   DG Chest  Port 1 View Result Date: 07/18/2023 CLINICAL DATA:  Status post mastectomy. EXAM: PORTABLE CHEST 1 VIEW COMPARISON:  May 11, 2023. FINDINGS: Stable cardiomegaly. Left-sided pacemaker is unchanged. Right internal jugular Port-A-Cath is unchanged. Mild perihilar and basilar opacities are noted concerning for atelectasis or edema and possible associated pleural effusions. Bony thorax is unremarkable. IMPRESSION: Mild bilateral perihilar and basilar opacities are noted concerning for atelectasis or edema and possible associated pleural effusions. Electronically Signed   By: Lupita Raider M.D.   On: 07/18/2023 15:52   MM Breast Surgical Specimen Result Date: 07/18/2023 CLINICAL DATA:  Status post [facility] localized LEFT breast lumpectomy for invasive mammary carcinoma. EXAM: SPECIMEN RADIOGRAPH OF THE LEFT BREAST COMPARISON:  Previous exam(s). FINDINGS: Status post excision of the LEFT breast. The [facility] reflector and RIBBON shaped clip are present within the specimen. IMPRESSION: Specimen radiograph of the LEFT breast. Electronically Signed   By: Jacob Moores M.D.   On: 07/18/2023 13:51   NM Sentinel Node Inj-No Rpt (Breast) Result Date: 07/18/2023 Sulfur Colloid was injected by the Nuclear Medicine Technologist for sentinel lymph node localization.   MM 3D DIAGNOSTIC  MAMMOGRAM UNILATERAL RIGHT BREAST Result Date: 07/18/2023 CLINICAL DATA:  78 year old female presenting for wire placement prior to excision of RIGHT papilloma with ADH. EXAM: DIAGNOSTIC RIGHT MAMMOGRAM POST PLACEMENT OF WIRE LOCALIZATION COMPARISON:  Previous exam(s). ACR Breast Density Category c: The breasts are heterogeneously dense, which may obscure small masses. FINDINGS: Mammographic images were obtained following ultrasound guided placement of a 7 cm Kopan's wire in the RIGHT breast. These demonstrate that the wire traverses the COIL shaped biopsy marking clip at the site of biopsy-proven papilloma with ADH. Notably, the  thick portion of the wire is located approximately 10 mm distal to the COIL clip in the medial direction. The SAVI scout is in stable in position, proximally 17 mm inferior to the coil shaped biopsy marking clip. IMPRESSION: Wire localization of the RIGHT breast. The wire traverses the COIL shaped biopsy marking clip, with the thick portion of the wire located approximately 10 mm distal to the clip in the medial direction. Final Assessment: Post Procedure Mammograms for [facility] placement Electronically Signed   By: Jacob Moores M.D.   On: 07/18/2023 09:57   [place] RT PLC BREAST LOC DEV   1ST LESION  INC US GUIDE Result Date: 07/18/2023 CLINICAL DATA:  78 year old female presenting for RIGHT breast wire localization prior to excision for biopsy-proven papilloma with ADH (demarcated by coil clip). EXAM: NEEDLE LOCALIZATION OF THE RIGHT BREAST WITH ULTRASOUND GUIDANCE COMPARISON:  Previous exam(s). FINDINGS: Patient presents for needle localization prior to excisional biopsy. I met with the patient and we discussed the procedure of needle localization including benefits and alternatives. We discussed the high likelihood of a successful procedure. We discussed the risks of the procedure, including infection, bleeding, tissue injury, and further surgery. Informed, written consent was given. The usual time-out protocol was performed immediately prior to the procedure. Using ultrasound guidance, sterile technique, 1% lidocaine and a 7 cm modified Kopans needle, COIL clip in the RIGHT breast 12 o'clock position 1 cm from the nipple was localized using a lateral approach. The images were marked for Dr. Hazle Quant. IMPRESSION: Needle localization of the RIGHT breast. No apparent complications. Electronically Signed   By: Jacob Moores M.D.   On: 07/18/2023 08:44     Assessment and plan- Patient is a 78 y.o. 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  She could not tolerate Keytruda due to skin rash.  She is here to discuss final pathology results and further management  Discussed the results of bilateral lumpectomy pathology after neoadjuvant weekly CarboTaxol chemotherapy.  She had a complete pathological response in her left breast.  1 sentinel and 2 nonsentinel lymph nodes were negative for malignancy.  She was found to have usual ductal hyperplasia in the excisional biopsy of the right breast and no evidence of atypia or malignancy.  She does not require any adjuvant treatment at this time.  Moreover she could not tolerate Keytruda well.  She has triple negative disease and therefore there was no role for adjuvant endocrine therapy.  Patient does not wish to do postlumpectomy radiation.  She understands that that would be the standard of care postlumpectomy.  She wishes to keep her port in place for now until she feels stronger before she wants to go for the next procedure.  I will therefore see her back in 3 months with CBC with differential and CMP  Normocytic anemia: Likely secondary to chronic disease as well as  prior chemotherapy.  Continue to monitor overall improving   Visit Diagnosis 1. Weakness of both lower extremities   2. Antineoplastic chemotherapy induced anemia   3. Malignant neoplasm of upper-outer quadrant of left breast in female, estrogen receptor negative (HCC)      Dr. Owens Shark, MD, MPH [facility] [phone] 08/08/2023 12:03 PM

## 2023-08-08 NOTE — Progress Notes (Signed)
 Patient reports feeling shaky is improving.  Occasional diarrhea after certain foods.

## 2023-08-08 NOTE — Telephone Encounter (Signed)
 Attempted to contact Jennifer Gregory at [facility] [phone].  Voice message left including call back information.

## 2023-08-09 ENCOUNTER — Telehealth: Payer: Self-pay | Admitting: *Deleted

## 2023-08-09 ENCOUNTER — Other Ambulatory Visit: Payer: Self-pay

## 2023-08-09 NOTE — Telephone Encounter (Signed)
 Wanted to have telephone ok  ot/PT for the pt. I called the [facility] and gave approval per [name] that I spoke to

## 2023-08-15 ENCOUNTER — Telehealth: Payer: Self-pay | Admitting: *Deleted

## 2023-08-15 NOTE — Telephone Encounter (Signed)
 Jennifer Gregory asked for PT for the pt and [name] is ok with that.

## 2023-09-11 ENCOUNTER — Other Ambulatory Visit: Payer: Self-pay

## 2023-10-12 ENCOUNTER — Telehealth: Payer: Self-pay | Admitting: *Deleted

## 2023-10-12 NOTE — Telephone Encounter (Signed)
 Send he wants authorization for PT 1 time a week for 9 weeks.  I spoke to Dr. Randy Buttery and she says that is fine, I called Raynelle Callow to let her know that it is okay.  I had to leave a message to let her know that it is okay to do that.

## 2023-11-07 ENCOUNTER — Encounter: Payer: Self-pay | Admitting: *Deleted

## 2023-11-07 ENCOUNTER — Inpatient Hospital Stay: Attending: Oncology

## 2023-11-07 ENCOUNTER — Inpatient Hospital Stay (HOSPITAL_BASED_OUTPATIENT_CLINIC_OR_DEPARTMENT_OTHER): Admitting: Oncology

## 2023-11-07 ENCOUNTER — Encounter: Payer: Self-pay | Admitting: Oncology

## 2023-11-07 VITALS — BP 128/94 | HR 77 | Temp 97.8°F | Resp 19 | Ht 63.0 in | Wt 241.2 lb

## 2023-11-07 DIAGNOSIS — Z79899 Other long term (current) drug therapy: Secondary | ICD-10-CM | POA: Insufficient documentation

## 2023-11-07 DIAGNOSIS — Z171 Estrogen receptor negative status [ER-]: Secondary | ICD-10-CM | POA: Insufficient documentation

## 2023-11-07 DIAGNOSIS — D6481 Anemia due to antineoplastic chemotherapy: Secondary | ICD-10-CM

## 2023-11-07 DIAGNOSIS — Z853 Personal history of malignant neoplasm of breast: Secondary | ICD-10-CM | POA: Diagnosis not present

## 2023-11-07 DIAGNOSIS — Z90710 Acquired absence of both cervix and uterus: Secondary | ICD-10-CM | POA: Diagnosis not present

## 2023-11-07 DIAGNOSIS — Z9221 Personal history of antineoplastic chemotherapy: Secondary | ICD-10-CM | POA: Insufficient documentation

## 2023-11-07 DIAGNOSIS — Z08 Encounter for follow-up examination after completed treatment for malignant neoplasm: Secondary | ICD-10-CM | POA: Diagnosis not present

## 2023-11-07 LAB — CBC WITH DIFFERENTIAL (CANCER CENTER ONLY)
Abs Immature Granulocytes: 0.01 10*3/uL (ref 0.00–0.07)
Basophils Absolute: 0 10*3/uL (ref 0.0–0.1)
Basophils Relative: 1 %
Eosinophils Absolute: 0.1 10*3/uL (ref 0.0–0.5)
Eosinophils Relative: 2 %
HCT: 36.7 % (ref 36.0–46.0)
Hemoglobin: 10.9 g/dL — ABNORMAL LOW (ref 12.0–15.0)
Immature Granulocytes: 0 %
Lymphocytes Relative: 27 %
Lymphs Abs: 1.3 10*3/uL (ref 0.7–4.0)
MCH: 26.5 pg (ref 26.0–34.0)
MCHC: 29.7 g/dL — ABNORMAL LOW (ref 30.0–36.0)
MCV: 89.3 fL (ref 80.0–100.0)
Monocytes Absolute: 0.4 10*3/uL (ref 0.1–1.0)
Monocytes Relative: 9 %
Neutro Abs: 3.1 10*3/uL (ref 1.7–7.7)
Neutrophils Relative %: 61 %
Platelet Count: 148 10*3/uL — ABNORMAL LOW (ref 150–400)
RBC: 4.11 MIL/uL (ref 3.87–5.11)
RDW: 17.3 % — ABNORMAL HIGH (ref 11.5–15.5)
WBC Count: 5 10*3/uL (ref 4.0–10.5)
nRBC: 0 % (ref 0.0–0.2)

## 2023-11-07 LAB — CMP (CANCER CENTER ONLY)
ALT: 11 U/L (ref 0–44)
AST: 20 U/L (ref 15–41)
Albumin: 3.5 g/dL (ref 3.5–5.0)
Alkaline Phosphatase: 90 U/L (ref 38–126)
Anion gap: 8 (ref 5–15)
BUN: 16 mg/dL (ref 8–23)
CO2: 27 mmol/L (ref 22–32)
Calcium: 9.1 mg/dL (ref 8.9–10.3)
Chloride: 103 mmol/L (ref 98–111)
Creatinine: 0.83 mg/dL (ref 0.44–1.00)
GFR, Estimated: >60 mL/min (ref >60)
Glucose, Bld: 100 mg/dL — ABNORMAL HIGH (ref 70–99)
Potassium: 4.3 mmol/L (ref 3.5–5.1)
Sodium: 138 mmol/L (ref 135–145)
Total Bilirubin: 1.9 mg/dL — ABNORMAL HIGH (ref 0.0–1.2)
Total Protein: 8 g/dL (ref 6.5–8.1)

## 2023-11-07 NOTE — Progress Notes (Signed)
 Survivorship Care Plan visit completed.  Treatment summary reviewed and given to patient.  ASCO answers booklet reviewed and given to patient.  CARE program and Cancer Transitions discussed with patient along with other resources cancer center offers to patients and caregivers.  Patient verbalized understanding.

## 2023-11-07 NOTE — Progress Notes (Signed)
 Hematology/Oncology Consult note [facility]  [phone] [phone]  Patient Care Team: Alexander Anes, MD as PCP - General (Family Medicine) Waverly Hageman, RN as Oncology Nurse Navigator Avonne Boettcher, MD as Consulting Physician (Oncology) Glenis Langdon, MD as Consulting Physician (Radiation Oncology) Isenstein, Arin L, MD as Consulting Physician (Dermatology) Eldred Grego, MD as Consulting Physician (General Surgery)   Name of the patient: Jennifer Gregory  [id number]  1945/07/30   Date of visit: 11/07/23  Diagnosis- 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  This was followed by a diagnostic mammogram and ultrasound.  Mammogram showed 3.4 x 1.8 x 3.5 cm mass at the 3 o'clock position of the left breast.  There was also an intraductal mass located at the 12 o'clock position of the right breast.  No axillary adenopathy was noted.  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  Right breast mass showed fragments of epithelial proliferation with atypia differential diagnosis includes intraductal papilloma with at least atypical ductal hyperplasia.   Patient has a history of heart failure with reduced ejection fraction and atrial fibrillation for which she follows up with Dr. Fortunato Ill from cardiology.  Plan is to proceed with 12 weeks of weekly CarboTaxol chemotherapy along with Keytruda .  Anthracycline will not be given.  Patient could not tolerate Keytruda  as evidenced by diarrhea and significant skin rash and was therefore stopped after 2 cycles.  Patient had significant skin rash that persisted  despite stopping Keytruda  and there was concern for allergic reaction as well because of which Taxol  was stopped after 4 cycles and she was switched to Abraxane .  She completed 11 weekly cycles of carboplatin  and Abraxane .  She had significant fatigue and required hospitalization after that and therefore 12th cycle was not completed   Patient had left lumpectomy with sentinel lymph node biopsy as well as excision biopsy of the right atypical ductal hyperplasia.  Left breast pathology showed complete pathological response to treatment and no evidence of residual malignancy.  1 sentinel and 2 nonsentinel lymph nodes negative for malignancy.Right breast pathology showed usual ductal hyperplasia negative for atypia and malignancy.  Interval history-patient states that she gets involuntary twitching/jerking movements of her hands and extremities when she does any purposeful movements. Denies any breast concerns  ECOG PS- 2 Pain scale- 0   Review of systems- Review of Systems  Constitutional:  Negative for chills, fever, malaise/fatigue and weight loss.  HENT:  Negative for congestion, ear discharge and nosebleeds.   Eyes:  Negative for blurred vision.  Respiratory:  Negative for cough, hemoptysis, sputum production, shortness of breath and wheezing.   Cardiovascular:  Negative for chest pain, palpitations, orthopnea and claudication.  Gastrointestinal:  Negative for abdominal pain, blood in stool, constipation, diarrhea, heartburn, melena, nausea and vomiting.  Genitourinary:  Negative for dysuria, flank pain, frequency, hematuria and urgency.  Musculoskeletal:  Negative for back pain, joint pain and myalgias.  Skin:  Negative for rash.  Neurological:  Negative for dizziness, tingling, focal weakness, seizures, weakness and headaches.  Endo/Heme/Allergies:  Does not bruise/bleed easily.  Psychiatric/Behavioral:  Negative for depression and suicidal ideas. The patient does not  have insomnia.        Allergies  Allergen Reactions   Aspirin Other (See Comments)    Bleeding risk since patient is on xarelto    Ciprofloxacin Itching and Rash        Codeine    Diazepam Nausea And Vomiting   Dofetilide Other (See Comments)    Other Reaction: arrhythmia    Hydrocodone -Acetaminophen  Nausea And Vomiting   Morphine  Nausea And Vomiting   Niacin Other (See Comments)    fatigue   Oxycodone -Acetaminophen  Nausea And Vomiting   Pembrolizumab  Rash   Sotalol Other (See Comments)    Other Reaction: Prolonged QTc    Amiodarone Other (See Comments)    Affecting liver    Pyridoxine Other (See Comments)    Splitting headache   Sulfamethoxazole-Trimethoprim Swelling   Other Rash    EKG sticker electrodes extended time     Past Medical History:  Diagnosis Date   (HFpEF) heart failure with preserved ejection fraction (HCC)    Antineoplastic chemotherapy induced anemia    Aortic atherosclerosis (HCC)    Ascending aorta dilatation (HCC)    Atrial fibrillation and flutter (HCC)    a.) CHA2DS2VASc = 7 (age x2, sex, HFpEF, HTN, vascular disease, T2DM) as of 07/09/2023; b.) s/p DCCV 08/19/2013 --> 200 J x 2 with brief conversion and ERAF; c.) s/p DCCV 09/08/2013: 300 J x 1 --> NSR; d.) rate/rhythm maintained on oral metoprolol  succinate; chronically anticoagulated with rivaroxaban    Cardiomegaly    Cirrhosis (HCC)    Complication of anesthesia    a.) respiratory depression --> I need to sit up immediately; documented history of orthopnea   Coronary artery disease 04/15/2007   a.) cCTA 04/15/2007: focal mixed calcified/non-calcified plaque (<50% stenosis) pLAD, mLAD, pD1; b.) mPET 03/21/2022: severe CAD and aortic atherosclerosis, normal myocardial perfusion.   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   a.) SJM CRM Zephyr XL DR 06/26/2008 (SN: [id number]); b.) replaced 03/28/2016: STM CRM Assurity MRI (SN: [id number])   Pulmonary HTN (HCC)    a.) TTE 03/13/2012: RVSP 34; b.) TTE 08/19/2013: RVSP 13; c.) TTE 12/03/2013: RVSP 39; d.) TTE 08/20/2014: RVSP 45; e.) TTE 03/14/2016: RVSP 28; f.) TTE 03/01/2021: RVSP 44; g.) TTE 06/05/2023: RVSP 49   Restrictive lung disease    Sick sinus syndrome (HCC) 06/26/2008   a.) s/p PPM 06/26/2008; b.) replaced/upgraded 03/28/2016   Sleep apnea    a.) no nocturnal PAP therapy   Subclinical hyperthyroidism    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   a.) CNB 01/15/2023 --> pathology (+) IMC with focal squamous differentiation (G3, no LVI, ER/PR (-), HER2/neu (-) by FISH; HER2/neu 2+ by IHC) --> stage II triple negative breast cancer; b.) s/p 11 wwekly cycles of neoadjuvant carboplatin  + pacliataxel chemotherapy; could not tolerate pembrolizumab  due to skin rash   Type 2 diabetes mellitus without complication (HCC)  Vitamin D  deficiency      Past Surgical History:  Procedure Laterality Date   ABSCESS DRAINAGE     BREAST BIOPSY Right 01/15/2023   US  RT BREAST BX W LOC DEV 1ST LESION IMG BX SPEC US  GUIDE 01/15/2023 [facility] (Coil clip --> pathology (+) epithelial proliferation with atypia --> DDx includes an intraductal papilloma with at least ADH)   BREAST BIOPSY Left 01/15/2023   US  LT BREAST BX W LOC DEV 1ST LESION IMG BX SPEC US  GUIDE 01/15/2023 [facility] (Ribbon clip --> pathology (+) IMC with focal squamous differentiation)   BREAST BIOPSY Right 07/04/2023    MM RT RADIO FREQUENCY TAG LOC MAMMO GUIDE 07/04/2023 [facility]   BREAST BIOPSY Left 07/04/2023   US  LT RADIO FREQUENCY TAG EA ADD LESION LOC US  GUIDE 07/04/2023 [facility]   BREAST BIOPSY Right 07/18/2023   US  RT PLC BREAST LOC DEV   1ST LESION  INC US  GUIDE 07/18/2023 [facility]   CARDIAC PACEMAKER PLACEMENT  06/26/2008   St. Jude dual chamber   CARDIOVERSION N/A 09/08/2013   CESAREAN SECTION     x 3   CHOLECYSTECTOMY     PACEMAKER GENERATOR CHANGE  03/28/2016   PART MASTECTOMY,RADIO FREQUENCY LOCALIZER,AXILLARY SENTINEL NODE BIOPSY Bilateral 07/18/2023   Procedure: PART MASTECTOMY, bil RADIO FREQUENCY LOCALIZER,   Lt  AXILLARY SENTINEL NODE BIOPSY;  Surgeon: Eldred Grego, MD;  Location: [facility];  Service: General;  Laterality: Bilateral;   PORTACATH PLACEMENT Right 01/31/2023   Procedure: INSERTION PORT-A-CATH;  Surgeon: Eldred Grego, MD;  Location: [facility];  Service: General;  Laterality: Right;   TEE WITH CARDIOVERSION N/A 08/19/2013   TOTAL ABDOMINAL HYSTERECTOMY     TUBAL LIGATION      Social History   Socioeconomic History   Marital status: Single    Spouse name: Not on file   Number of children: 3   Years of education: Not on file   Highest education level: Not on file  Occupational History   Not on file  Tobacco Use   Smoking status: Never   Smokeless tobacco: Never  Vaping Use   Vaping status: Never Used  Substance and Sexual Activity   Alcohol use: Not Currently   Drug use: Never   Sexual activity: Not Currently  Other Topics Concern   Not on file  Social History Narrative   Not on file   Social Drivers of Health   Financial Resource Strain: Low Risk  (09/11/2023)   Received from [facility]   Overall Financial Resource Strain (CARDIA)    Difficulty of Paying Living Expenses: Not hard at all  Food Insecurity: No Food Insecurity (09/11/2023)   [facility]   Hunger Vital  Sign    Within the past 12 months, you worried that your food would run out before you got the money to buy more.: Never true    Within the past 12 months, the food you bought just didn't last and you didn't have money to get more.: Never true  Transportation Needs: No Transportation Needs (09/11/2023)   Received from [facility] - Transportation    In the past 12 months, has lack of transportation kept you from medical appointments or from getting medications?: No    Lack of Transportation (Non-Medical): No  Physical Activity: Not on file  Stress: Not on file  Social Connections: Unknown (07/19/2023)   Social Connection and Isolation Panel    Frequency of Communication with Friends  and Family: Not on file    Frequency of Social Gatherings with Friends and Family: Not on file    Attends Religious Services: Not on file    Active Member of Clubs or Organizations: Not on file    Attends Club or Organization Meetings: Not on file    Marital Status: Divorced  Intimate Partner Violence: Not At Risk (07/18/2023)   Humiliation, Afraid, Rape, and Kick questionnaire    Fear of Current or Ex-Partner: No    Emotionally Abused: No    Physically Abused: No    Sexually Abused: No    Family History  Problem Relation Age of Onset   Mesothelioma Father      Current Outpatient Medications:    citalopram  (CELEXA ) 10 MG tablet, Take 10 mg by mouth daily., Disp: , Rfl:    metoprolol  succinate (TOPROL -XL) 25 MG 24 hr tablet, Take 12.5 mg by mouth daily., Disp: , Rfl:    Multiple Vitamins-Minerals (CENTRUM SILVER 50+WOMEN PO), Take 1 tablet by mouth daily., Disp: , Rfl:    potassium chloride  (KLOR-CON ) 10 MEQ tablet, Take 30 mEq by mouth 2 (two) times daily., Disp: , Rfl:    rivaroxaban  (XARELTO ) 20 MG TABS tablet, Take 20 mg by mouth every evening., Disp: , Rfl:    torsemide (DEMADEX) 20 MG tablet, Take 40 mg by mouth daily in the afternoon., Disp: , Rfl:    triamcinolone   ointment (KENALOG ) 0.5 %, Apply 1 Application topically 2 (two) times daily as needed (itching from rash)., Disp: 30 g, Rfl: 1   clobetasol cream (TEMOVATE) 0.05 %, Apply topically. (Patient not taking: Reported on 07/03/2023), Disp: , Rfl:    collagenase  (SANTYL ) 250 UNIT/GM ointment, Apply topically daily. (Patient not taking: Reported on 07/03/2023), Disp: 15 g, Rfl: 0   dexamethasone  (DECADRON ) 4 MG tablet, Take 2 tablets daily for 2 days, start the day after chemotherapy. Take with food. (Patient not taking: Reported on 06/29/2023), Disp: 30 tablet, Rfl: 1   diphenoxylate -atropine  (LOMOTIL ) 2.5-0.025 MG tablet, Take 1 tablet by mouth 4 (four) times daily as needed for diarrhea or loose stools. (Patient not taking: Reported on 08/08/2023), Disp: 30 tablet, Rfl: 0   feeding supplement (ENSURE ENLIVE / ENSURE PLUS) LIQD, Take 237 mLs by mouth 2 (two) times daily between meals. (Patient not taking: Reported on 07/03/2023), Disp: , Rfl:    lidocaine -prilocaine  (EMLA ) cream, Apply to affected area once (Patient not taking: Reported on 11/07/2023), Disp: 30 g, Rfl: 3   magic mouthwash w/lidocaine  SOLN, Take 15 mLs by mouth 4 (four) times daily as needed for mouth pain. Suspension contains equal amounts of Maalox Extra Strength, nystatin , diphenhydramine  and lidocaine . (Patient not taking: Reported on 07/03/2023), Disp: 250 mL, Rfl: 1   nitroGLYCERIN (NITROSTAT) 0.4 MG SL tablet, Place 0.4 mg under the tongue every 5 (five) minutes as needed for chest pain. (Patient not taking: Reported on 02/08/2023), Disp: , Rfl:    nutrition supplement, JUVEN, (JUVEN) PACK, Take 1 packet by mouth 2 (two) times daily between meals. (Patient not taking: Reported on 07/03/2023), Disp: 30 each, Rfl: 0   nystatin  (MYCOSTATIN ) 100000 UNIT/ML suspension, Take 5 mLs (500,000 Units total) by mouth 4 (four) times daily. (Patient not taking: Reported on 07/03/2023), Disp: 473 mL, Rfl: 0   ondansetron  (ZOFRAN ) 4 MG tablet, Take 1 tablet (4 mg  total) by mouth daily as needed for nausea or vomiting. (Patient not taking: Reported on 06/29/2023), Disp: 30 tablet, Rfl: 1   prochlorperazine  (COMPAZINE ) 10 MG tablet,  Take 1 tablet (10 mg total) by mouth every 6 (six) hours as needed for nausea or vomiting. (Patient not taking: Reported on 06/29/2023), Disp: 30 tablet, Rfl: 1  Physical exam:  Vitals:   11/07/23 0937  BP: (!) 128/94  Pulse: 77  Resp: 19  Temp: 97.8 F (36.6 C)  TempSrc: Tympanic  SpO2: 97%  Weight: 241 lb 3.2 oz (109.4 kg)  Height: 5' 3 (1.6 m)   Physical Exam  Cardiovascular:     Rate and Rhythm: Normal rate and regular rhythm.     Heart sounds: Normal heart sounds.  Pulmonary:     Effort: Pulmonary effort is normal.     Breath sounds: Normal breath sounds.  Abdominal:     General: Bowel sounds are normal.   Skin:    General: Skin is warm and dry.   Neurological:     Mental Status: She is alert and oriented to person, place, and time.    Breast exam was performed in seated and lying down position. Patient is status post left and right lumpectomy with a well-healed surgical scar. No evidence of any palpable masses. No evidence of axillary adenopathy. No evidence of any palpable masses or lumps in the right breast. No evidence of right axillary adenopathy   I have personally reviewed labs listed below:    Latest Ref Rng & Units 11/07/2023    9:01 AM  CMP  Glucose 70 - 99 mg/dL 284   BUN 8 - 23 mg/dL 16   Creatinine 1.32 - 1.00 mg/dL 4.40   Sodium 102 - 725 mmol/L 138   Potassium 3.5 - 5.1 mmol/L 4.3   Chloride 98 - 111 mmol/L 103   CO2 22 - 32 mmol/L 27   Calcium 8.9 - 10.3 mg/dL 9.1   Total Protein 6.5 - 8.1 g/dL 8.0   Total Bilirubin 0.0 - 1.2 mg/dL 1.9   Alkaline Phos 38 - 126 U/L 90   AST 15 - 41 U/L 20   ALT 0 - 44 U/L 11       Latest Ref Rng & Units 11/07/2023    9:01 AM  CBC  WBC 4.0 - 10.5 K/uL 5.0   Hemoglobin 12.0 - 15.0 g/dL 36.6   Hematocrit 44.0 - 46.0 % 36.7   Platelets 150 -  400 K/uL 148      Assessment and plan- Patient is a 78 y.o. 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  She could not tolerate Keytruda  due to skin rash.  She had a pathological complete response and is here for routine follow-up of breast cancer  Clinically patient is doing well with no concerning signs and symptoms of recurrence based on today's exam.  She had triple negative disease and therefore she is not on endocrine therapy.  She will be due for mammogram in August 2025 which I will schedule.  I will see her back in 6 months no labs.  With regards to involuntary movements in her hands and extremities we discussed referral to neurology.  Patient is also following up with Dr. Caretha Chapel and will let us  know if she desires neurology referral   Visit Diagnosis 1. Encounter for follow-up surveillance of breast cancer      Dr. Seretha Dance, MD, MPH [facility] at [facility] [phone] 11/07/2023 1:05 PM

## 2023-11-07 NOTE — Progress Notes (Signed)
 Patient reports she's been doing well. She has had one fall within the last few months but is undergoing PT. She has no new or acute concerns today.

## 2023-11-08 ENCOUNTER — Other Ambulatory Visit: Payer: Self-pay

## 2023-11-13 ENCOUNTER — Encounter: Payer: Self-pay | Admitting: Oncology

## 2023-11-13 DIAGNOSIS — Z08 Encounter for follow-up examination after completed treatment for malignant neoplasm: Secondary | ICD-10-CM

## 2023-11-14 ENCOUNTER — Other Ambulatory Visit: Payer: Self-pay | Admitting: Oncology

## 2023-11-14 DIAGNOSIS — Z08 Encounter for follow-up examination after completed treatment for malignant neoplasm: Secondary | ICD-10-CM

## 2024-01-17 ENCOUNTER — Telehealth: Payer: Self-pay

## 2024-01-17 NOTE — Telephone Encounter (Deleted)
 Fax received from [employer] in regards to outpatient services requested for this patient.  Letter is offering a peer to peer conversation to discuss services performed by a qualified physical therapist in the home health or hospice setting, each 15 minutes.

## 2024-01-23 ENCOUNTER — Ambulatory Visit
Admission: RE | Admit: 2024-01-23 | Discharge: 2024-01-23 | Disposition: A | Discharge status: Home / Self Care | Source: Ambulatory Visit | Attending: Oncology | Admitting: Oncology

## 2024-01-23 DIAGNOSIS — Z08 Encounter for follow-up examination after completed treatment for malignant neoplasm: Secondary | ICD-10-CM | POA: Diagnosis present

## 2024-01-23 DIAGNOSIS — Z853 Personal history of malignant neoplasm of breast: Secondary | ICD-10-CM | POA: Insufficient documentation

## 2024-01-24 ENCOUNTER — Encounter: Payer: Self-pay | Admitting: Oncology

## 2024-01-24 NOTE — Telephone Encounter (Signed)
 Dr. Melanee willing to do peer to peer only if it's guaranteed if it's approved.

## 2024-05-07 ENCOUNTER — Encounter: Payer: Self-pay | Admitting: Oncology

## 2024-05-07 ENCOUNTER — Inpatient Hospital Stay: Attending: Oncology | Admitting: Oncology

## 2024-05-07 VITALS — BP 112/50 | HR 78 | Temp 96.9°F | Resp 19 | Ht 63.0 in | Wt 249.3 lb

## 2024-05-07 DIAGNOSIS — Z853 Personal history of malignant neoplasm of breast: Secondary | ICD-10-CM | POA: Insufficient documentation

## 2024-05-07 DIAGNOSIS — Z08 Encounter for follow-up examination after completed treatment for malignant neoplasm: Secondary | ICD-10-CM

## 2024-05-07 DIAGNOSIS — Z9221 Personal history of antineoplastic chemotherapy: Secondary | ICD-10-CM | POA: Diagnosis not present

## 2024-05-07 NOTE — Progress Notes (Signed)
 Patient states she needs an doctors order to cancel oxygen.

## 2024-05-07 NOTE — Progress Notes (Signed)
 Hematology/Oncology Consult note [facility]  Telephone:([phone] [phone]  Patient Care Team: Jennifer Idelia LABOR, MD as PCP - General (Family Medicine) Jennifer Shasta POUR, RN as Oncology Nurse Navigator Jennifer Annah BROCKS, MD as Consulting Physician (Oncology) Jennifer Aran, MD as Consulting Physician (Radiation Oncology) Isenstein, Arin L, MD as Consulting Physician (Dermatology) Jennifer Romano, MD as Consulting Physician (General Surgery)   Name of the patient: Jennifer Gregory  [id number]  [birth year]   Date of visit: 05/07/2024  Diagnosis-history of 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  This was followed by a diagnostic mammogram and ultrasound.  Mammogram showed 3.4 x 1.8 x 3.5 cm mass at the 3 o'clock position of the left breast.  There was also an intraductal mass located at the 12 o'clock position of the right breast.  No axillary adenopathy was noted.  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  Right breast mass showed fragments of epithelial proliferation with atypia differential diagnosis includes intraductal papilloma with at least atypical ductal hyperplasia.   Patient has a history of heart failure with reduced ejection fraction and atrial fibrillation for which she follows up with Jennifer Gregory from cardiology.  Plan is to proceed with 12 weeks of weekly CarboTaxol chemotherapy along with Keytruda .  Anthracycline will not be given.  Patient could not tolerate Keytruda  as evidenced by diarrhea and significant skin rash and was therefore stopped after 2 cycles.  Patient had significant skin rash that  persisted despite stopping Keytruda  and there was concern for allergic reaction as well because of which Taxol  was stopped after 4 cycles and she was switched to Abraxane .  She completed 11 weekly cycles of carboplatin  and Abraxane .  She had significant fatigue and required hospitalization after that and therefore 12th cycle was not completed   Patient had left lumpectomy with sentinel lymph node biopsy as well as excision biopsy of the right atypical ductal hyperplasia.  Left breast pathology showed complete pathological response to treatment and no evidence of residual malignancy.  1 sentinel and 2 nonsentinel lymph nodes negative for malignancy.Right breast pathology showed usual ductal hyperplasia negative for atypia and malignancy.  She declined adjuvant radiation therapy  Interval history- Jennifer Gregory is a 78 year old female with stage II triple negative breast cancer, status post incomplete chemotherapy and surgery with pathologic complete response, who presents for routine oncology follow-up.  She completed chemotherapy for stage II triple negative breast cancer, achieving a pathologic complete response despite not receiving the full course of chemotherapy. Her most recent mammogram in September showed no evidence of recurrence.  She reports improved appetite compared to previous visits and increased somnolence. She continues to ambulate independently and attends physical therapy for cervical arthritis, diagnosed after evaluation for neck pain.  She underwent a brain CT scan for episodes of myoclonic jerks; no etiology was identified, and she did not experience these movements during evaluation. She is under neurologic assessment for these symptoms.  Recent laboratory studies in October and December demonstrated stable renal function and hemoglobin of 11.8 g/dL.       ECOG PS- 2 Pain scale- 0   Review of systems- Review of Systems  Constitutional:  Negative for chills, fever,  malaise/fatigue and weight loss.  HENT:  Negative for congestion, ear discharge and nosebleeds.   Eyes:  Negative for blurred vision.  Respiratory:  Negative for cough, hemoptysis, sputum production, shortness of breath and wheezing.   Cardiovascular:  Negative for chest pain, palpitations, orthopnea and claudication.  Gastrointestinal:  Negative for abdominal pain, blood in stool, constipation, diarrhea, heartburn, melena, nausea and vomiting.  Genitourinary:  Negative for dysuria, flank pain, frequency, hematuria and urgency.  Musculoskeletal:  Negative for back pain, joint pain and myalgias.  Skin:  Negative for rash.  Neurological:  Negative for dizziness, tingling, focal weakness, seizures, weakness and headaches.  Endo/Heme/Allergies:  Does not bruise/bleed easily.  Psychiatric/Behavioral:  Negative for depression and suicidal ideas. The patient does not have insomnia.       Allergies[1]   Past Medical History:  Diagnosis Date   (HFpEF) heart failure with preserved ejection fraction (HCC)    Antineoplastic chemotherapy induced anemia    Aortic atherosclerosis    Ascending aorta dilatation    Atrial fibrillation and flutter (HCC)    a.) CHA2DS2VASc = 7 (age x2, sex, HFpEF, HTN, vascular disease, T2DM) as of 07/09/2023; b.) s/p DCCV 08/19/2013 --> 200 J x 2 with brief conversion and ERAF; c.) s/p DCCV 09/08/2013: 300 J x 1 --> NSR; d.) rate/rhythm maintained on oral metoprolol  succinate; chronically anticoagulated with rivaroxaban    Cardiomegaly    Cirrhosis (HCC)    Complication of anesthesia    a.) respiratory depression --> I need to sit up immediately; documented history of orthopnea   Coronary artery disease 04/15/2007   a.) cCTA 04/15/2007: focal mixed calcified/non-calcified plaque (<50% stenosis) pLAD, mLAD, pD1; b.) mPET 03/21/2022: severe CAD and aortic atherosclerosis, normal myocardial perfusion.   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   a.) SJM CRM Zephyr XL DR 06/26/2008 (SN: [id number]); b.) replaced 03/28/2016: STM CRM Assurity MRI (SN: [id number])   Pulmonary HTN (HCC)    a.) TTE 03/13/2012: RVSP 34; b.) TTE 08/19/2013: RVSP 13; c.) TTE 12/03/2013: RVSP 39; d.) TTE 08/20/2014: RVSP 45; e.) TTE 03/14/2016: RVSP 28; f.) TTE 03/01/2021: RVSP 44; g.) TTE 06/05/2023: RVSP 49   Restrictive lung disease    Sick sinus syndrome (HCC) 06/26/2008   a.) s/p PPM 06/26/2008; b.) replaced/upgraded 03/28/2016   Sleep apnea    a.) no nocturnal PAP therapy   Subclinical hyperthyroidism    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   a.) CNB 01/15/2023 --> pathology (+) IMC with focal squamous differentiation (G3, no LVI, ER/PR (-), HER2/neu (-) by FISH; HER2/neu 2+ by IHC) --> stage II triple negative breast cancer; b.) s/p 11 wwekly cycles of neoadjuvant carboplatin  + pacliataxel chemotherapy; could not tolerate pembrolizumab  due to skin rash   Type 2 diabetes mellitus without complication    Vitamin D  deficiency      Past Surgical  History:  Procedure Laterality Date   ABSCESS DRAINAGE     BREAST BIOPSY Right 01/15/2023   US  RT BREAST BX W LOC DEV 1ST LESION IMG BX SPEC US  GUIDE 01/15/2023 [facility] (Coil clip --> pathology (+) epithelial proliferation with atypia --> DDx includes an intraductal papilloma with at least ADH)   BREAST BIOPSY Left 01/15/2023   US  LT BREAST BX W LOC  DEV 1ST LESION IMG BX SPEC US  GUIDE 01/15/2023 [facility] (Ribbon clip --> pathology (+) IMC with focal squamous differentiation)   BREAST BIOPSY Right 07/04/2023   MM RT RADIO FREQUENCY TAG LOC MAMMO GUIDE 07/04/2023 [facility]   BREAST BIOPSY Left 07/04/2023   US  LT RADIO FREQUENCY TAG EA ADD LESION LOC US  GUIDE 07/04/2023 [facility]   BREAST BIOPSY Right 07/18/2023   US  RT PLC BREAST LOC DEV   1ST LESION  INC US  GUIDE 07/18/2023 [facility]   CARDIAC PACEMAKER PLACEMENT  06/26/2008   St. Jude dual chamber   CARDIOVERSION N/A 09/08/2013   CESAREAN SECTION     x 3   CHOLECYSTECTOMY     PACEMAKER GENERATOR CHANGE  03/28/2016   PART MASTECTOMY,RADIO FREQUENCY LOCALIZER,AXILLARY SENTINEL NODE BIOPSY Bilateral 07/18/2023   Procedure: PART MASTECTOMY, bil RADIO FREQUENCY LOCALIZER,   Lt  AXILLARY SENTINEL NODE BIOPSY;  Surgeon: Jennifer Romano, MD;  Location: [facility];  Service: General;  Laterality: Bilateral;   PORTACATH PLACEMENT Right 01/31/2023   Procedure: INSERTION PORT-A-CATH;  Surgeon: Jennifer Romano, MD;  Location: [facility];  Service: General;  Laterality: Right;   TEE WITH CARDIOVERSION N/A 08/19/2013   TOTAL ABDOMINAL HYSTERECTOMY     TUBAL LIGATION      Social History   Socioeconomic History   Marital status: Single    Spouse name: Not on file   Number of children: 3   Years of education: Not on file   Highest education level: Not on file  Occupational History   Not on file  Tobacco Use   Smoking status: Never   Smokeless tobacco: Never  Vaping Use   Vaping status: Never Used  Substance and Sexual Activity   Alcohol use: Not Currently   Drug use: Never   Sexual activity: Not Currently  Other Topics Concern   Not on file  Social History Narrative   Not on file   Social Drivers of Health   Tobacco Use: Low Risk (05/07/2024)   Patient History    Smoking Tobacco Use: Never    Smokeless Tobacco Use: Never    Passive Exposure: Not  on file  Financial Resource Strain: Low Risk  (09/11/2023)   Received from [facility]   Overall Financial Resource Strain (CARDIA)    Difficulty of Paying Living Expenses: Not hard at all  Food Insecurity: No Food Insecurity (09/11/2023)   Received from [facility]   Epic    Within the past 12 months, you worried that your food would run out before you got the money to buy more.: Never true    Within the past 12 months, the food you bought just didn't last and you didn't have money to get more.: Never true  Transportation Needs: No Transportation Needs (09/11/2023)   Received from [facility] - Transportation    In the past 12 months, has lack of transportation kept you from medical appointments or from getting medications?: No    Lack of Transportation (Non-Medical): No  Physical Activity: Not on file  Stress: Not on file  Social Connections: Unknown (07/19/2023)   Social Connection and Isolation Panel    Frequency of Communication with Friends and Family: Not on file    Frequency of Social Gatherings with Friends and Family: Not on file    Attends Religious Services: Not on file    Active Member of Clubs or Organizations: Not on file    Attends Banker Meetings: Not on file    Marital Status: Divorced  Intimate Partner Violence: Not At Risk (07/18/2023)   Humiliation, Afraid, Rape, and Kick questionnaire    Fear of Current or Ex-Partner: No    Emotionally Abused: No    Physically Abused: No    Sexually Abused: No  Depression (PHQ2-9): Low Risk (05/07/2024)   Depression (PHQ2-9)    PHQ-2 Score: 0  Alcohol Screen: Not on file  Housing: Low Risk  (09/11/2023)   Received from [facility]   Epic    In the last 12 months, was there a time when you were not able to pay the mortgage or rent on time?: No    In the past 12 months, how many times have you moved where you were living?: 0    At any  time in the past 12 months, were you homeless or living in a shelter (including now)?: No  Utilities: Not At Risk (09/11/2023)   Received from [facility] Utilities    Threatened with loss of utilities: No  Health Literacy: Not on file    Family History  Problem Relation Age of Onset   Mesothelioma Father     Current Medications[2]  Physical exam:  Vitals:   05/07/24 1001  BP: (!) 112/50  Pulse: 78  Resp: 19  Temp: (!) 96.9 F (36.1 C)  TempSrc: Tympanic  SpO2: 99%  Weight: 249 lb 4.8 oz (113.1 kg)  Height: 5' 3 (1.6 m)   Physical Exam Cardiovascular:     Rate and Rhythm: Normal rate and regular rhythm.     Heart sounds: Normal heart sounds.  Pulmonary:     Effort: Pulmonary effort is normal.     Breath sounds: Normal breath sounds.  Skin:    General: Skin is warm and dry.  Neurological:     Mental Status: She is alert and oriented to person, place, and time.      I have personally reviewed labs listed below:    Latest Ref Rng & Units 11/07/2023    9:01 AM  CMP  Glucose 70 - 99 mg/dL 899   BUN 8 - 23 mg/dL 16   Creatinine 9.55 - 1.00 mg/dL 9.16   Sodium 864 - 854 mmol/Gregory 138   Potassium 3.5 - 5.1 mmol/Gregory 4.3   Chloride 98 - 111 mmol/Gregory 103   CO2 22 - 32 mmol/Gregory 27   Calcium 8.9 - 10.3 mg/dL 9.1   Total Protein 6.5 - 8.1 g/dL 8.0   Total Bilirubin 0.0 - 1.2 mg/dL 1.9   Alkaline Phos 38 - 126 U/Gregory 90   AST 15 - 41 U/Gregory 20   ALT 0 - 44 U/Gregory 11       Latest Ref Rng & Units 11/07/2023    9:01 AM  CBC  WBC 4.0 - 10.5 K/uL 5.0   Hemoglobin 12.0 - 15.0 g/dL 89.0   Hematocrit 63.9 - 46.0 % 36.7   Platelets 150 - 400 K/uL 148      Assessment and plan- Patient is a 78 y.o. female with  history of stage II triple negative left breast cancer s/p 11 cycles of weekly carboplatin  Abraxane  chemotherapy. She could not tolerate Keytruda  due to skin rash.  She had a pathological complete response following neoadjuvant chemotherapy and went on to get  lumpectomy.  She declined adjuvant radiation therapy.  She is here for routine follow-up  Assessment and Plan    Stage II triple negative breast cancer, post-chemotherapy with pathologic complete response - Clinically patient is doing well with no concerning signs and symptoms of recurrence based on today's exam. In remission with pathologic complete response. Asymptomatic with no recurrence on recent mammogram and stable labs. - Reviewed normal September mammogram. - Reviewed stable hemoglobin and renal function. - No additional imaging unless new symptoms develop. - Continue routine surveillance with mammography.  -I will see her back in 6 months no labs        Visit Diagnosis 1. Encounter for follow-up surveillance of breast cancer      Dr. Annah Skene, MD, MPH [facility] [phone] 05/07/2024 1:05 PM                   [1]  Allergies Allergen Reactions   Aspirin Other (See Comments)    Bleeding risk since patient is on xarelto    Ciprofloxacin Itching and Rash        Codeine    Diazepam Nausea And Vomiting   Dofetilide Other (See Comments)    Other Reaction: arrhythmia    Hydrocodone -Acetaminophen  Nausea And Vomiting   Morphine  Nausea And Vomiting   Niacin Other (See Comments)    fatigue   Oxycodone -Acetaminophen  Nausea And Vomiting   Pembrolizumab  Rash   Sotalol Other (See Comments)    Other Reaction: Prolonged QTc    Amiodarone Other (See Comments)    Affecting liver    Pyridoxine Other (See Comments)    Splitting headache   Sulfamethoxazole-Trimethoprim Swelling   Other Rash    EKG sticker electrodes extended time  [2]  Current Outpatient Medications:    citalopram  (CELEXA ) 10 MG tablet, Take 10 mg by mouth daily., Disp: , Rfl:    metoprolol  succinate (TOPROL -XL) 25 MG 24 hr tablet, Take 12.5 mg by mouth daily., Disp: , Rfl:    Multiple Vitamins-Minerals (CENTRUM SILVER 50+WOMEN PO), Take 1 tablet by mouth  daily., Disp: , Rfl:    potassium chloride  (KLOR-CON ) 10 MEQ tablet, Take 30 mEq by mouth 2 (two) times daily., Disp: , Rfl:    rivaroxaban  (XARELTO ) 20 MG TABS tablet, Take 20 mg by mouth every evening., Disp: , Rfl:    torsemide (DEMADEX) 20 MG tablet, Take 40 mg by mouth daily in the afternoon., Disp: , Rfl:    clobetasol cream (TEMOVATE) 0.05 %, Apply topically. (Patient not taking: Reported on 07/03/2023), Disp: , Rfl:    collagenase  (SANTYL ) 250 UNIT/GM ointment, Apply topically daily. (Patient not taking: Reported on 07/03/2023), Disp: 15 g, Rfl: 0   dexamethasone  (DECADRON ) 4 MG tablet, Take 2 tablets daily for 2 days, start the day after chemotherapy. Take with food. (Patient not taking: Reported on 06/29/2023), Disp: 30 tablet, Rfl: 1   diphenoxylate -atropine  (LOMOTIL ) 2.5-0.025 MG tablet, Take 1 tablet by mouth 4 (four) times daily as needed for diarrhea or loose stools. (Patient not taking: Reported on 08/08/2023), Disp: 30 tablet, Rfl: 0   feeding supplement (ENSURE ENLIVE / ENSURE PLUS) LIQD, Take 237 mLs by mouth 2 (two) times daily between meals. (Patient not taking: Reported on 07/03/2023), Disp: , Rfl:  lidocaine -prilocaine  (EMLA ) cream, Apply to affected area once (Patient not taking: Reported on 11/07/2023), Disp: 30 g, Rfl: 3   magic mouthwash w/lidocaine  SOLN, Take 15 mLs by mouth 4 (four) times daily as needed for mouth pain. Suspension contains equal amounts of Maalox Extra Strength, nystatin , diphenhydramine  and lidocaine . (Patient not taking: Reported on 07/03/2023), Disp: 250 mL, Rfl: 1   nitroGLYCERIN (NITROSTAT) 0.4 MG SL tablet, Place 0.4 mg under the tongue every 5 (five) minutes as needed for chest pain. (Patient not taking: Reported on 02/08/2023), Disp: , Rfl:    nutrition supplement, JUVEN, (JUVEN) PACK, Take 1 packet by mouth 2 (two) times daily between meals. (Patient not taking: Reported on 07/03/2023), Disp: 30 each, Rfl: 0   nystatin  (MYCOSTATIN ) 100000 UNIT/ML  suspension, Take 5 mLs (500,000 Units total) by mouth 4 (four) times daily. (Patient not taking: Reported on 07/03/2023), Disp: 473 mL, Rfl: 0   prochlorperazine  (COMPAZINE ) 10 MG tablet, Take 1 tablet (10 mg total) by mouth every 6 (six) hours as needed for nausea or vomiting. (Patient not taking: Reported on 06/29/2023), Disp: 30 tablet, Rfl: 1   triamcinolone  ointment (KENALOG ) 0.5 %, Apply 1 Application topically 2 (two) times daily as needed (itching from rash). (Patient not taking: Reported on 05/07/2024), Disp: 30 g, Rfl: 1

## 2024-05-09 ENCOUNTER — Other Ambulatory Visit: Payer: Self-pay

## 2024-11-05 ENCOUNTER — Inpatient Hospital Stay: Admitting: Oncology
